# Patient Record
Sex: Female | Born: 1935
Health system: Southern US, Community
[De-identification: ages and names within clinical notes are randomized; demographics above are authoritative.]

## PROBLEM LIST (undated history)

## (undated) DIAGNOSIS — I10 Essential (primary) hypertension: Secondary | ICD-10-CM

## (undated) DIAGNOSIS — Z803 Family history of malignant neoplasm of breast: Secondary | ICD-10-CM

## (undated) DIAGNOSIS — Z853 Personal history of malignant neoplasm of breast: Secondary | ICD-10-CM

## (undated) DIAGNOSIS — D649 Anemia, unspecified: Secondary | ICD-10-CM

## (undated) DIAGNOSIS — E785 Hyperlipidemia, unspecified: Secondary | ICD-10-CM

## (undated) DIAGNOSIS — M1990 Unspecified osteoarthritis, unspecified site: Secondary | ICD-10-CM

## (undated) DIAGNOSIS — Z800 Family history of malignant neoplasm of digestive organs: Secondary | ICD-10-CM

## (undated) DIAGNOSIS — Z9889 Other specified postprocedural states: Secondary | ICD-10-CM

## (undated) DIAGNOSIS — T7840XA Allergy, unspecified, initial encounter: Secondary | ICD-10-CM

## (undated) DIAGNOSIS — C4491 Basal cell carcinoma of skin, unspecified: Secondary | ICD-10-CM

## (undated) DIAGNOSIS — M8580 Other specified disorders of bone density and structure, unspecified site: Secondary | ICD-10-CM

## (undated) HISTORY — DX: Other specified disorders of bone density and structure, unspecified site: M85.80

## (undated) HISTORY — PX: VEIN SURGERY: SHX48

## (undated) HISTORY — PX: COLONOSCOPY: SHX174

## (undated) HISTORY — DX: Personal history of malignant neoplasm of breast: Z85.3

## (undated) HISTORY — DX: Unspecified osteoarthritis, unspecified site: M19.90

## (undated) HISTORY — DX: Basal cell carcinoma of skin, unspecified: C44.91

## (undated) HISTORY — PX: EYE SURGERY: SHX253

## (undated) HISTORY — DX: Allergy, unspecified, initial encounter: T78.40XA

## (undated) HISTORY — DX: Family history of malignant neoplasm of digestive organs: Z80.0

## (undated) HISTORY — DX: Hyperlipidemia, unspecified: E78.5

## (undated) HISTORY — DX: Essential (primary) hypertension: I10

## (undated) HISTORY — DX: Family history of malignant neoplasm of breast: Z80.3

---

## 1978-01-28 HISTORY — PX: BREAST SURGERY: SHX581

## 1981-01-28 HISTORY — PX: ABDOMINAL HYSTERECTOMY: SHX81

## 1991-01-29 HISTORY — PX: CHOLECYSTECTOMY: SHX55

## 1997-07-28 ENCOUNTER — Ambulatory Visit (HOSPITAL_COMMUNITY): Admission: RE | Admit: 1997-07-28 | Discharge: 1997-07-28 | Payer: Self-pay | Admitting: Internal Medicine

## 2001-01-14 ENCOUNTER — Encounter: Payer: Self-pay | Admitting: Internal Medicine

## 2001-01-14 ENCOUNTER — Ambulatory Visit (HOSPITAL_COMMUNITY): Admission: RE | Admit: 2001-01-14 | Discharge: 2001-01-14 | Payer: Self-pay | Admitting: Internal Medicine

## 2001-04-14 ENCOUNTER — Encounter: Payer: Self-pay | Admitting: *Deleted

## 2001-04-14 ENCOUNTER — Ambulatory Visit (HOSPITAL_COMMUNITY): Admission: RE | Admit: 2001-04-14 | Discharge: 2001-04-14 | Payer: Self-pay | Admitting: *Deleted

## 2004-02-09 ENCOUNTER — Ambulatory Visit: Payer: Self-pay | Admitting: Internal Medicine

## 2004-02-15 ENCOUNTER — Ambulatory Visit: Payer: Self-pay | Admitting: Internal Medicine

## 2004-06-12 ENCOUNTER — Ambulatory Visit: Payer: Self-pay | Admitting: Internal Medicine

## 2004-12-03 ENCOUNTER — Ambulatory Visit: Payer: Self-pay | Admitting: Internal Medicine

## 2005-02-11 ENCOUNTER — Ambulatory Visit: Payer: Self-pay | Admitting: Internal Medicine

## 2005-08-05 ENCOUNTER — Ambulatory Visit: Payer: Self-pay | Admitting: Internal Medicine

## 2005-08-12 ENCOUNTER — Ambulatory Visit: Payer: Self-pay | Admitting: Internal Medicine

## 2006-02-05 ENCOUNTER — Ambulatory Visit: Payer: Self-pay | Admitting: Internal Medicine

## 2006-03-19 ENCOUNTER — Ambulatory Visit: Payer: Self-pay | Admitting: Internal Medicine

## 2006-04-30 ENCOUNTER — Ambulatory Visit: Payer: Self-pay | Admitting: Internal Medicine

## 2006-08-18 DIAGNOSIS — J309 Allergic rhinitis, unspecified: Secondary | ICD-10-CM | POA: Insufficient documentation

## 2006-08-18 DIAGNOSIS — Z853 Personal history of malignant neoplasm of breast: Secondary | ICD-10-CM

## 2006-08-18 DIAGNOSIS — M8580 Other specified disorders of bone density and structure, unspecified site: Secondary | ICD-10-CM | POA: Insufficient documentation

## 2006-08-18 DIAGNOSIS — I10 Essential (primary) hypertension: Secondary | ICD-10-CM

## 2006-09-03 ENCOUNTER — Ambulatory Visit: Payer: Self-pay | Admitting: Internal Medicine

## 2006-09-03 LAB — CONVERTED CEMR LAB
ALT: 23 units/L (ref 0–35)
AST: 23 units/L (ref 0–37)
Albumin: 4.1 g/dL (ref 3.5–5.2)
Alkaline Phosphatase: 99 units/L (ref 39–117)
BUN: 9 mg/dL (ref 6–23)
Basophils Absolute: 0 10*3/uL (ref 0.0–0.1)
Basophils Relative: 0 % (ref 0.0–1.0)
Bilirubin Urine: NEGATIVE
Bilirubin, Direct: 0.1 mg/dL (ref 0.0–0.3)
Blood in Urine, dipstick: NEGATIVE
CO2: 27 meq/L (ref 19–32)
Calcium: 9.7 mg/dL (ref 8.4–10.5)
Chloride: 106 meq/L (ref 96–112)
Cholesterol: 132 mg/dL (ref 0–200)
Creatinine, Ser: 0.7 mg/dL (ref 0.4–1.2)
Eosinophils Absolute: 0.1 10*3/uL (ref 0.0–0.6)
Eosinophils Relative: 1.4 % (ref 0.0–5.0)
GFR calc Af Amer: 106 mL/min
GFR calc non Af Amer: 88 mL/min
Glucose, Bld: 95 mg/dL (ref 70–99)
Glucose, Urine, Semiquant: NEGATIVE
HCT: 35.2 % — ABNORMAL LOW (ref 36.0–46.0)
HDL: 50.6 mg/dL (ref >39.0)
Hemoglobin: 12 g/dL (ref 12.0–15.0)
Ketones, urine, test strip: NEGATIVE
LDL Cholesterol: 49 mg/dL (ref 0–99)
Lymphocytes Relative: 33.4 % (ref 12.0–46.0)
MCHC: 34 g/dL (ref 30.0–36.0)
MCV: 88.5 fL (ref 78.0–100.0)
Monocytes Absolute: 0.5 10*3/uL (ref 0.2–0.7)
Monocytes Relative: 8.5 % (ref 3.0–11.0)
Neutro Abs: 3.3 10*3/uL (ref 1.4–7.7)
Neutrophils Relative %: 56.7 % (ref 43.0–77.0)
Nitrite: NEGATIVE
Platelets: 260 10*3/uL (ref 150–400)
Potassium: 3.8 meq/L (ref 3.5–5.1)
Protein, U semiquant: NEGATIVE
RBC: 3.98 M/uL (ref 3.87–5.11)
RDW: 13 % (ref 11.5–14.6)
Sodium: 141 meq/L (ref 135–145)
Specific Gravity, Urine: 1.01
TSH: 1.65 microintl units/mL (ref 0.35–5.50)
Total Bilirubin: 1 mg/dL (ref 0.3–1.2)
Total CHOL/HDL Ratio: 2.6
Total Protein: 7 g/dL (ref 6.0–8.3)
Triglycerides: 160 mg/dL — ABNORMAL HIGH (ref 0–149)
Urobilinogen, UA: 0.2
VLDL: 32 mg/dL (ref 0–40)
WBC Urine, dipstick: NEGATIVE
WBC: 5.9 10*3/uL (ref 4.5–10.5)
pH: 7

## 2006-09-10 ENCOUNTER — Ambulatory Visit: Payer: Self-pay | Admitting: Internal Medicine

## 2006-09-10 DIAGNOSIS — E785 Hyperlipidemia, unspecified: Secondary | ICD-10-CM | POA: Insufficient documentation

## 2006-10-06 ENCOUNTER — Encounter: Payer: Self-pay | Admitting: Internal Medicine

## 2006-12-10 ENCOUNTER — Encounter: Payer: Self-pay | Admitting: Internal Medicine

## 2007-03-09 ENCOUNTER — Ambulatory Visit: Payer: Self-pay | Admitting: Internal Medicine

## 2007-03-10 LAB — CONVERTED CEMR LAB
ALT: 20 units/L (ref 0–35)
AST: 18 units/L (ref 0–37)
Albumin: 4.1 g/dL (ref 3.5–5.2)
Alkaline Phosphatase: 98 units/L (ref 39–117)
BUN: 9 mg/dL (ref 6–23)
Bilirubin, Direct: 0.2 mg/dL (ref 0.0–0.3)
CO2: 30 meq/L (ref 19–32)
Calcium: 9.9 mg/dL (ref 8.4–10.5)
Chloride: 105 meq/L (ref 96–112)
Cholesterol: 150 mg/dL (ref 0–200)
Creatinine, Ser: 0.7 mg/dL (ref 0.4–1.2)
GFR calc Af Amer: 106 mL/min
GFR calc non Af Amer: 88 mL/min
Glucose, Bld: 125 mg/dL — ABNORMAL HIGH (ref 70–99)
HDL: 47.2 mg/dL (ref >39.0)
LDL Cholesterol: 70 mg/dL (ref 0–99)
Potassium: 4.2 meq/L (ref 3.5–5.1)
Sodium: 142 meq/L (ref 135–145)
Total Bilirubin: 0.7 mg/dL (ref 0.3–1.2)
Total CHOL/HDL Ratio: 3.2
Total Protein: 6.7 g/dL (ref 6.0–8.3)
Triglycerides: 162 mg/dL — ABNORMAL HIGH (ref 0–149)
VLDL: 32 mg/dL (ref 0–40)

## 2007-09-22 ENCOUNTER — Ambulatory Visit: Payer: Self-pay | Admitting: Internal Medicine

## 2007-09-28 ENCOUNTER — Telehealth: Payer: Self-pay | Admitting: Internal Medicine

## 2007-10-12 ENCOUNTER — Encounter: Payer: Self-pay | Admitting: Internal Medicine

## 2007-10-15 ENCOUNTER — Encounter: Payer: Self-pay | Admitting: Internal Medicine

## 2008-04-05 ENCOUNTER — Ambulatory Visit: Payer: Self-pay | Admitting: Internal Medicine

## 2008-04-06 LAB — CONVERTED CEMR LAB
ALT: 23 units/L (ref 0–35)
AST: 22 units/L (ref 0–37)
Albumin: 4.1 g/dL (ref 3.5–5.2)
Alkaline Phosphatase: 92 units/L (ref 39–117)
BUN: 10 mg/dL (ref 6–23)
Basophils Absolute: 0.1 10*3/uL (ref 0.0–0.1)
Basophils Relative: 0.9 % (ref 0.0–3.0)
Bilirubin, Direct: 0.1 mg/dL (ref 0.0–0.3)
CO2: 25 meq/L (ref 19–32)
Calcium: 9.7 mg/dL (ref 8.4–10.5)
Chloride: 106 meq/L (ref 96–112)
Cholesterol: 153 mg/dL (ref 0–200)
Creatinine, Ser: 0.6 mg/dL (ref 0.4–1.2)
Eosinophils Absolute: 0.1 10*3/uL (ref 0.0–0.7)
Eosinophils Relative: 1 % (ref 0.0–5.0)
GFR calc Af Amer: 126 mL/min
GFR calc non Af Amer: 104 mL/min
Glucose, Bld: 99 mg/dL (ref 70–99)
HCT: 37.4 % (ref 36.0–46.0)
HDL: 51.7 mg/dL (ref >39.0)
Hemoglobin: 12.8 g/dL (ref 12.0–15.0)
LDL Cholesterol: 74 mg/dL (ref 0–99)
Lymphocytes Relative: 23.7 % (ref 12.0–46.0)
MCHC: 34.1 g/dL (ref 30.0–36.0)
MCV: 88.7 fL (ref 78.0–100.0)
Monocytes Absolute: 0.5 10*3/uL (ref 0.1–1.0)
Monocytes Relative: 6.8 % (ref 3.0–12.0)
Neutro Abs: 5.1 10*3/uL (ref 1.4–7.7)
Neutrophils Relative %: 67.6 % (ref 43.0–77.0)
Platelets: 237 10*3/uL (ref 150–400)
Potassium: 4.1 meq/L (ref 3.5–5.1)
RBC: 4.22 M/uL (ref 3.87–5.11)
RDW: 13 % (ref 11.5–14.6)
Sodium: 140 meq/L (ref 135–145)
TSH: 1.45 microintl units/mL (ref 0.35–5.50)
Total Bilirubin: 1.2 mg/dL (ref 0.3–1.2)
Total CHOL/HDL Ratio: 3
Total Protein: 6.9 g/dL (ref 6.0–8.3)
Triglycerides: 135 mg/dL (ref 0–149)
VLDL: 27 mg/dL (ref 0–40)
WBC: 7.6 10*3/uL (ref 4.5–10.5)

## 2008-09-19 ENCOUNTER — Ambulatory Visit: Payer: Self-pay | Admitting: Internal Medicine

## 2008-10-11 ENCOUNTER — Ambulatory Visit: Payer: Self-pay | Admitting: Internal Medicine

## 2008-10-14 ENCOUNTER — Encounter: Payer: Self-pay | Admitting: Internal Medicine

## 2008-10-18 ENCOUNTER — Encounter: Payer: Self-pay | Admitting: Internal Medicine

## 2008-10-31 ENCOUNTER — Encounter: Payer: Self-pay | Admitting: Internal Medicine

## 2009-04-06 ENCOUNTER — Ambulatory Visit: Payer: Self-pay | Admitting: Internal Medicine

## 2009-04-10 ENCOUNTER — Encounter: Payer: Self-pay | Admitting: Internal Medicine

## 2009-04-10 LAB — CONVERTED CEMR LAB
ALT: 16 units/L (ref 0–35)
AST: 17 units/L (ref 0–37)
Albumin: 4 g/dL (ref 3.5–5.2)
Alkaline Phosphatase: 94 units/L (ref 39–117)
BUN: 10 mg/dL (ref 6–23)
Basophils Absolute: 0 10*3/uL (ref 0.0–0.1)
Basophils Relative: 0.5 % (ref 0.0–3.0)
Bilirubin, Direct: 0.1 mg/dL (ref 0.0–0.3)
CO2: 29 meq/L (ref 19–32)
Calcium: 9.5 mg/dL (ref 8.4–10.5)
Chloride: 109 meq/L (ref 96–112)
Cholesterol: 127 mg/dL (ref 0–200)
Creatinine, Ser: 0.6 mg/dL (ref 0.4–1.2)
Direct LDL: 64 mg/dL
Eosinophils Absolute: 0.1 10*3/uL (ref 0.0–0.7)
Eosinophils Relative: 1.4 % (ref 0.0–5.0)
GFR calc non Af Amer: 103.96 mL/min (ref >60)
Glucose, Bld: 107 mg/dL — ABNORMAL HIGH (ref 70–99)
HCT: 34.6 % — ABNORMAL LOW (ref 36.0–46.0)
HDL: 49.6 mg/dL (ref >39.00)
Hemoglobin: 11.7 g/dL — ABNORMAL LOW (ref 12.0–15.0)
Lymphocytes Relative: 36.1 % (ref 12.0–46.0)
Lymphs Abs: 2.2 10*3/uL (ref 0.7–4.0)
MCHC: 33.8 g/dL (ref 30.0–36.0)
MCV: 89.4 fL (ref 78.0–100.0)
Monocytes Absolute: 0.5 10*3/uL (ref 0.1–1.0)
Monocytes Relative: 7.7 % (ref 3.0–12.0)
Neutro Abs: 3.3 10*3/uL (ref 1.4–7.7)
Neutrophils Relative %: 54.3 % (ref 43.0–77.0)
Platelets: 251 10*3/uL (ref 150.0–400.0)
Potassium: 3.9 meq/L (ref 3.5–5.1)
RBC: 3.87 M/uL (ref 3.87–5.11)
RDW: 13.1 % (ref 11.5–14.6)
Sodium: 141 meq/L (ref 135–145)
TSH: 1.39 microintl units/mL (ref 0.35–5.50)
Total Bilirubin: 0.7 mg/dL (ref 0.3–1.2)
Total CHOL/HDL Ratio: 3
Total Protein: 7 g/dL (ref 6.0–8.3)
Triglycerides: 228 mg/dL — ABNORMAL HIGH (ref 0.0–149.0)
VLDL: 45.6 mg/dL — ABNORMAL HIGH (ref 0.0–40.0)
WBC: 6.1 10*3/uL (ref 4.5–10.5)

## 2009-04-18 ENCOUNTER — Ambulatory Visit: Payer: Self-pay | Admitting: Internal Medicine

## 2009-04-18 LAB — CONVERTED CEMR LAB
Ferritin: 66.6 ng/mL (ref 10.0–291.0)
Iron: 76 ug/dL (ref 42–145)
Saturation Ratios: 21.7 % (ref 20.0–50.0)
Transferrin: 249.9 mg/dL (ref 212.0–360.0)
Vitamin B-12: 281 pg/mL (ref 211–911)

## 2009-04-19 ENCOUNTER — Encounter: Payer: Self-pay | Admitting: Internal Medicine

## 2009-10-23 ENCOUNTER — Ambulatory Visit: Payer: Self-pay | Admitting: Internal Medicine

## 2009-10-23 DIAGNOSIS — R21 Rash and other nonspecific skin eruption: Secondary | ICD-10-CM | POA: Insufficient documentation

## 2009-10-26 LAB — CONVERTED CEMR LAB
ALT: 21 units/L (ref 0–35)
AST: 21 units/L (ref 0–37)
Albumin: 4.3 g/dL (ref 3.5–5.2)
Alkaline Phosphatase: 92 units/L (ref 39–117)
BUN: 12 mg/dL (ref 6–23)
Bilirubin, Direct: 0.1 mg/dL (ref 0.0–0.3)
CO2: 27 meq/L (ref 19–32)
Calcium: 9.8 mg/dL (ref 8.4–10.5)
Chloride: 105 meq/L (ref 96–112)
Cholesterol: 164 mg/dL (ref 0–200)
Creatinine, Ser: 0.5 mg/dL (ref 0.4–1.2)
Direct LDL: 83.4 mg/dL
GFR calc non Af Amer: 122.44 mL/min (ref >60)
Glucose, Bld: 88 mg/dL (ref 70–99)
HDL: 50.3 mg/dL (ref >39.00)
Potassium: 4.5 meq/L (ref 3.5–5.1)
Sodium: 140 meq/L (ref 135–145)
TSH: 1.61 microintl units/mL (ref 0.35–5.50)
Total Bilirubin: 0.8 mg/dL (ref 0.3–1.2)
Total CHOL/HDL Ratio: 3
Total Protein: 6.8 g/dL (ref 6.0–8.3)
Triglycerides: 207 mg/dL — ABNORMAL HIGH (ref 0.0–149.0)
VLDL: 41.4 mg/dL — ABNORMAL HIGH (ref 0.0–40.0)

## 2009-11-01 ENCOUNTER — Encounter: Payer: Self-pay | Admitting: Internal Medicine

## 2010-02-25 LAB — CONVERTED CEMR LAB
Cholesterol, target level: 200 mg/dL
HDL goal, serum: 40 mg/dL
LDL Goal: 130 mg/dL

## 2010-02-27 NOTE — Assessment & Plan Note (Signed)
Summary: 6 month rov/njr/pt rsc/cjr//CCM   Vital Signs:  Patient profile:   77 year old female Weight:      140 pounds Temp:     97.9 degrees F oral Resp:     12 per minute BP sitting:   138 / 52  Vitals Entered By: Lynann Beaver CMA (April 06, 2009 2:07 PM) CC: Routine office visit. Is Patient Diabetic? No Pain Assessment Patient in pain? no        CC:  Routine office visit.Jane Cox  History of Present Illness:  Follow-Up Visit      This is a 77 year old woman who presents for Follow-up visit.  The patient denies chest pain and palpitations.  Since the last visit the patient notes no new problems or concerns.  The patient reports taking meds as prescribed.  When questioned about possible medication side effects, the patient notes none.    All other systems reviewed and were negative   Current Problems (verified): 1)  Physical Examination  (ICD-V70.0) 2)  Hyperlipidemia  (ICD-272.4) 3)  Osteopenia  (ICD-733.90) 4)  Hypertension  (ICD-401.9) 5)  Breast Cancer, Hx of  (ICD-V10.3) 6)  Allergic Rhinitis  (ICD-477.9)  Current Medications (verified): 1)  Lipitor 20 Mg Tabs (Atorvastatin Calcium) .... 1/2 By Mouth Once Daily 2)  Lisinopril 40 Mg Tabs (Lisinopril) .... Take 1 Tablet By Mouth Once A Day 3)  Methyldopa 500 Mg Tabs (Methyldopa) .... Take 1 Tablet By Mouth Twice A Day 4)  Norvasc 5 Mg Tabs (Amlodipine Besylate) .... Take 1/2 Daily 5)  Multivitamins  Tabs (Multiple Vitamin) .... Once Daily 6)  Calcium 500 Mg Tabs (Calcium Carbonate) .... Once Daily 7)  Tylenol 325 Mg Tabs (Acetaminophen) .... As Needed 8)  Advil 200 Mg Tabs (Ibuprofen) .... Once Daily As Needed  Allergies (verified): 1)  ! * Shellfish 2)  Polysporin (Bacitracin-Polymyxin B)  Past History:  Past Medical History: Last updated: 08/18/2006 Allergic rhinitis Breast cancer, hx of Hypertension Basel Cell CA Osteopenia  Past Surgical History: Last updated:  08/18/2006 Cholecystectomy-1993 Hysterectomy-1983 Mastectomy Colonoscopy-05/27/2000  Family History: Last updated: 09/10/2006 Family History Lung cancer-father mother 87yo deceased-  Social History: Last updated: 08/18/2006 Never Smoked Retired Married Alcohol use-yes-rare  Risk Factors: Smoking Status: never (08/18/2006)  Physical Exam  General:  alert and well-developed.   Head:  normocephalic and atraumatic.   Eyes:  pupils equal and pupils round.   Ears:  R ear normal and L ear normal.   Neck:  No deformities, masses, or tenderness noted. Chest Wall:  No deformities, masses, or tenderness noted. Lungs:  Normal respiratory effort, chest expands symmetrically. Lungs are clear to auscultation, no crackles or wheezes. Abdomen:  soft and non-tender.   Msk:  No deformity or scoliosis noted of thoracic or lumbar spine.   Pulses:  R radial normal and L radial normal.   Neurologic:  cranial nerves II-XII intact and gait normal.     Impression & Recommendations:  Problem # 1:  HYPERLIPIDEMIA (ICD-272.4)  controlled continue current medications  Her updated medication list for this problem includes:    Lipitor 20 Mg Tabs (Atorvastatin calcium) .Jane Cox... 1/2 by mouth once daily  Labs Reviewed: SGOT: 22 (04/05/2008)   SGPT: 23 (04/05/2008)  Lipid Goals: Chol Goal: 200 (09/10/2006)   HDL Goal: 40 (09/10/2006)   LDL Goal: 130 (09/10/2006)   TG Goal: 150 (09/10/2006)  Prior 10 Yr Risk Heart Disease: 7 % (09/10/2006)   HDL:51.7 (04/05/2008), 47.2 (03/09/2007)  LDL:74 (04/05/2008), 70 (03/09/2007)  Chol:153 (04/05/2008), 150 (03/09/2007)  Trig:135 (04/05/2008), 162 (03/09/2007)  Orders: Venipuncture (04540) TLB-Lipid Panel (80061-LIPID) TLB-Hepatic/Liver Function Pnl (80076-HEPATIC) TLB-TSH (Thyroid Stimulating Hormone) (84443-TSH)  Problem # 2:  HYPERTENSION (ICD-401.9)  controlled continue current medications  Her updated medication list for this problem includes:     Lisinopril 40 Mg Tabs (Lisinopril) .Jane Cox... Take 1 tablet by mouth once a day    Methyldopa 500 Mg Tabs (Methyldopa) .Jane Cox... Take 1 tablet by mouth twice a day    Amlodipine Besylate 2.5 Mg Tabs (Amlodipine besylate) .Jane Cox... Take 1 tablet by mouth once a day  BP today: 138/52 Prior BP: 140/78 (10/11/2008)  Prior 10 Yr Risk Heart Disease: 7 % (09/10/2006)  Labs Reviewed: K+: 4.1 (04/05/2008) Creat: : 0.6 (04/05/2008)   Chol: 153 (04/05/2008)   HDL: 51.7 (04/05/2008)   LDL: 74 (04/05/2008)   TG: 135 (04/05/2008)  Orders: TLB-BMP (Basic Metabolic Panel-BMET) (80048-METABOL)  Problem # 3:  OSTEOPENIA (ICD-733.90)  calcium and vitamin D  Complete Medication List: 1)  Lipitor 20 Mg Tabs (Atorvastatin calcium) .... 1/2 by mouth once daily 2)  Lisinopril 40 Mg Tabs (Lisinopril) .... Take 1 tablet by mouth once a day 3)  Methyldopa 500 Mg Tabs (Methyldopa) .... Take 1 tablet by mouth twice a day 4)  Amlodipine Besylate 2.5 Mg Tabs (Amlodipine besylate) .... Take 1 tablet by mouth once a day 5)  Multivitamins Tabs (Multiple vitamin) .... Once daily 6)  Calcium 500 Mg Tabs (Calcium carbonate) .... Once daily 7)  Tylenol 325 Mg Tabs (Acetaminophen) .... As needed 8)  Advil 200 Mg Tabs (Ibuprofen) .... Once daily as needed  Other Orders: TLB-CBC Platelet - w/Differential (85025-CBCD)  Patient Instructions: 1)  Please schedule a follow-up appointment in 6 months. wellness exam Prescriptions: AMLODIPINE BESYLATE 2.5 MG TABS (AMLODIPINE BESYLATE) Take 1 tablet by mouth once a day  #90 x 3   Entered and Authorized by:   Birdie Sons MD   Signed by:   Birdie Sons MD on 04/06/2009   Method used:   [facility]        [facility] [phone]* (retail)       [address]       [place], [place]  [address]       Ph: [phone]       Fax: [phone]   RxID:   [id number]

## 2010-02-27 NOTE — Assessment & Plan Note (Signed)
Summary: 6 MTH ROV // RS pt rsc/njr   Vital Signs:  Patient profile:   77 year old female Weight:      139 pounds Temp:     98.7 degrees F oral Pulse rate:   78 / minute Pulse rhythm:   regular Resp:     12 per minute BP sitting:   154 / 72  Vitals Entered By: Lynann Beaver CMA (October 23, 2009 11:31 AM) CC: rov Is Patient Diabetic? No Pain Assessment Patient in pain? no        CC:  rov.  History of Present Illness:  Follow-Up Visit      This is a 77 year old woman who presents for Follow-up visit.  The patient denies chest pain and palpitations.  Since the last visit the patient notes no new problems or concerns except sinus congestion for several weeks with PNDr. no fever, dhills, ST---some relief with OTC antihistamines.  The patient reports taking meds as prescribed.  When questioned about possible medication side effects, the patient notes none.    All other systems reviewed and were negative   Current Problems (verified): 1)  Physical Examination  (ICD-V70.0) 2)  Hyperlipidemia  (ICD-272.4) 3)  Osteopenia  (ICD-733.90) 4)  Hypertension  (ICD-401.9) 5)  Breast Cancer, Hx of  (ICD-V10.3) 6)  Allergic Rhinitis  (ICD-477.9)  Current Medications (verified): 1)  Lipitor 20 Mg Tabs (Atorvastatin Calcium) .... 1/2 By Mouth Once Daily 2)  Lisinopril 40 Mg Tabs (Lisinopril) .... Take 1 Tablet By Mouth Once A Day 3)  Methyldopa 500 Mg Tabs (Methyldopa) .... Take 1 Tablet By Mouth Twice A Day 4)  Amlodipine Besylate 2.5 Mg Tabs (Amlodipine Besylate) .... Take 1 Tablet By Mouth Once A Day 5)  Multivitamins  Tabs (Multiple Vitamin) .... Once Daily 6)  Calcium 500 Mg Tabs (Calcium Carbonate) .... Once Daily 7)  Tylenol 325 Mg Tabs (Acetaminophen) .... As Needed 8)  Advil 200 Mg Tabs (Ibuprofen) .... Once Daily As Needed  Allergies (verified): 1)  ! * Shellfish 2)  Polysporin (Bacitracin-Polymyxin B)  Past History:  Past Medical History: Reviewed history from  08/18/2006 and no changes required. Allergic rhinitis Breast cancer, hx of Hypertension Basel Cell CA Osteopenia  Past Surgical History: Cholecystectomy-1993 Hysterectomy-1983 Mastectomy Colonoscopy-05/27/2000 vein surgery  Family History: Reviewed history from 09/10/2006 and no changes required. Family History Lung cancer-father mother 62yo deceased-  Social History: Reviewed history from 08/18/2006 and no changes required. Never Smoked Retired Married Alcohol use-yes-rare  Physical Exam  General:  alert and well-developed.   Head:  normocephalic and atraumatic.   Eyes:  pupils equal and pupils round.   Neck:  No deformities, masses, or tenderness noted. Chest Wall:  No deformities, masses, or tenderness noted. Lungs:  Normal respiratory effort, chest expands symmetrically. Lungs are clear to auscultation, no crackles or wheezes. Heart:  normal rate and regular rhythm.   Abdomen:  soft and non-tender.   Skin:  turgor normal and color normal.     Impression & Recommendations:  Problem # 1:  ALLERGIC RHINITIS (ICD-477.9)  Her updated medication list for this problem includes:    Fluticasone Propionate 50 Mcg/act Susp (Fluticasone propionate) .Marland Kitchen... 2 sprays each nostril once daily  Problem # 2:  HYPERLIPIDEMIA (ICD-272.4)  controlled continue current medications  Her updated medication list for this problem includes:    Lipitor 20 Mg Tabs (Atorvastatin calcium) .Marland Kitchen... 1/2 by mouth once daily  Labs Reviewed: SGOT: 17 (04/06/2009)   SGPT: 16 (04/06/2009)  Lipid Goals: Chol Goal: 200 (09/10/2006)   HDL Goal: 40 (09/10/2006)   LDL Goal: 130 (09/10/2006)   TG Goal: 150 (09/10/2006)  Prior 10 Yr Risk Heart Disease: 7 % (09/10/2006)   HDL:49.60 (04/06/2009), 51.7 (04/05/2008)  LDL:74 (04/05/2008), 70 (03/09/2007)  Chol:127 (04/06/2009), 153 (04/05/2008)  Trig:228.0 (04/06/2009), 135 (04/05/2008)  Orders: Specimen Handling (16109) TLB-Hepatic/Liver Function Pnl  (80076-HEPATIC) TLB-Lipid Panel (80061-LIPID) TLB-TSH (Thyroid Stimulating Hormone) (84443-TSH)  Problem # 3:  HYPERTENSION (ICD-401.9)  repeat BP ok home BPs 130/70 average Her updated medication list for this problem includes:    Lisinopril 40 Mg Tabs (Lisinopril) .Marland Kitchen... Take 1 tablet by mouth once a day    Methyldopa 500 Mg Tabs (Methyldopa) .Marland Kitchen... Take 1 tablet by mouth twice a day    Amlodipine Besylate 2.5 Mg Tabs (Amlodipine besylate) .Marland Kitchen... Take 1 tablet by mouth once a day  BP today: 154/72 Prior BP: 138/52 (04/06/2009)  Prior 10 Yr Risk Heart Disease: 7 % (09/10/2006)  Labs Reviewed: K+: 3.9 (04/06/2009) Creat: : 0.6 (04/06/2009)   Chol: 127 (04/06/2009)   HDL: 49.60 (04/06/2009)   LDL: 74 (04/05/2008)   TG: 228.0 (04/06/2009)  Orders: Venipuncture (60454) TLB-BMP (Basic Metabolic Panel-BMET) (80048-METABOL)  Complete Medication List: 1)  Lipitor 20 Mg Tabs (Atorvastatin calcium) .... 1/2 by mouth once daily 2)  Lisinopril 40 Mg Tabs (Lisinopril) .... Take 1 tablet by mouth once a day 3)  Methyldopa 500 Mg Tabs (Methyldopa) .... Take 1 tablet by mouth twice a day 4)  Amlodipine Besylate 2.5 Mg Tabs (Amlodipine besylate) .... Take 1 tablet by mouth once a day 5)  Multivitamins Tabs (Multiple vitamin) .... Once daily 6)  Calcium 500 Mg Tabs (Calcium carbonate) .... Once daily 7)  Tylenol 325 Mg Tabs (Acetaminophen) .... As needed 8)  Advil 200 Mg Tabs (Ibuprofen) .... Once daily as needed 9)  Fluticasone Propionate 50 Mcg/act Susp (Fluticasone propionate) .... 2 sprays each nostril once daily  Patient Instructions: 1)  Please schedule a follow-up appointment in 6 months. medicare wellness visit Prescriptions: FLUTICASONE PROPIONATE 50 MCG/ACT  SUSP (FLUTICASONE PROPIONATE) 2 sprays each nostril once daily  #1 vial x 3   Entered and Authorized by:   Birdie Sons MD   Signed by:   Birdie Sons MD on 10/23/2009   Method used:   [facility] to        [facility] [phone]* (retail)       [address]       [place], [place]  [address]       Ph: [phone]       Fax: [phone]   RxID:   [id number]

## 2010-02-27 NOTE — Letter (Signed)
Summary: Health Fair Screening  Health Fair Screening   Imported By: Maryln Gottron 04/10/2009 10:43:05  _____________________________________________________________________  External Attachment:    Type:   Image     Comment:   External Document

## 2010-02-27 NOTE — Letter (Signed)
Summary: [facility]  [facility]   Imported By: Maryln Gottron 05/02/2009 12:26:34  _____________________________________________________________________  External Attachment:    Type:   Image     Comment:   External Document

## 2010-04-13 ENCOUNTER — Other Ambulatory Visit: Payer: Self-pay | Admitting: Internal Medicine

## 2010-04-25 ENCOUNTER — Encounter: Payer: Self-pay | Admitting: Internal Medicine

## 2010-04-26 ENCOUNTER — Encounter: Payer: Self-pay | Admitting: Internal Medicine

## 2010-04-26 ENCOUNTER — Ambulatory Visit (INDEPENDENT_AMBULATORY_CARE_PROVIDER_SITE_OTHER): Payer: Medicare Other | Admitting: Internal Medicine

## 2010-04-26 VITALS — BP 152/78 | HR 88 | Temp 97.7°F | Ht 62.0 in | Wt 138.0 lb

## 2010-04-26 DIAGNOSIS — Z23 Encounter for immunization: Secondary | ICD-10-CM

## 2010-04-26 DIAGNOSIS — Z0000 Encounter for general adult medical examination without abnormal findings: Secondary | ICD-10-CM

## 2010-04-26 DIAGNOSIS — I10 Essential (primary) hypertension: Secondary | ICD-10-CM

## 2010-04-26 DIAGNOSIS — E785 Hyperlipidemia, unspecified: Secondary | ICD-10-CM

## 2010-04-26 LAB — POCT URINALYSIS DIPSTICK
Bilirubin, UA: NEGATIVE
Blood, UA: NEGATIVE
Nitrite, UA: NEGATIVE
Protein, UA: NEGATIVE
pH, UA: 6.5

## 2010-04-26 LAB — CBC WITH DIFFERENTIAL/PLATELET
Basophils Absolute: 0 10*3/uL (ref 0.0–0.1)
Hemoglobin: 12.2 g/dL (ref 12.0–15.0)
Lymphocytes Relative: 21.5 % (ref 12.0–46.0)
Monocytes Relative: 5.1 % (ref 3.0–12.0)
Neutro Abs: 6.5 10*3/uL (ref 1.4–7.7)
Neutrophils Relative %: 72.1 % (ref 43.0–77.0)
Platelets: 240 10*3/uL (ref 150.0–400.0)
RDW: 14 % (ref 11.5–14.6)

## 2010-04-26 LAB — HEPATIC FUNCTION PANEL
ALT: 19 U/L (ref 0–35)
AST: 21 U/L (ref 0–37)
Albumin: 4.2 g/dL (ref 3.5–5.2)
Total Bilirubin: 0.9 mg/dL (ref 0.3–1.2)
Total Protein: 7 g/dL (ref 6.0–8.3)

## 2010-04-26 LAB — LIPID PANEL
Cholesterol: 144 mg/dL (ref 0–200)
HDL: 46.6 mg/dL (ref >39.00)
Triglycerides: 153 mg/dL — ABNORMAL HIGH (ref 0.0–149.0)
VLDL: 30.6 mg/dL (ref 0.0–40.0)

## 2010-04-26 LAB — TSH: TSH: 1.7 u[IU]/mL (ref 0.35–5.50)

## 2010-04-26 LAB — BASIC METABOLIC PANEL
Calcium: 9.6 mg/dL (ref 8.4–10.5)
GFR: 82.66 mL/min (ref >60.00)
Glucose, Bld: 79 mg/dL (ref 70–99)
Sodium: 143 mEq/L (ref 135–145)

## 2010-04-26 NOTE — Assessment & Plan Note (Signed)
Needs f/u Check labs today 

## 2010-04-26 NOTE — Progress Notes (Signed)
  Subjective:    Patient ID: Jane Cox, female    DOB: 1934/01/20, 77 y.o.   MRN: [id number]  HPI  CPX  URI sxs for 10-12 days. Started after travelling. Sxs are resolving Past Medical History  Diagnosis Date  . Allergy   . HX: breast cancer   . Hypertension   . Basal cell carcinoma   . Osteopenia    Past Surgical History  Procedure Date  . Cholecystectomy 1993  . Abdominal hysterectomy 1983  . Breast surgery     mastectomy  . Vein surgery     reports that she has never smoked. She does not have any smokeless tobacco history on file. She reports that she drinks alcohol. Her drug history not on file. family history includes Lung cancer in her father. Allergies  Allergen Reactions  . Bacitracin-Polymyxin B     REACTION: rash     Review of Systems  patient denies chest pain, shortness of breath, orthopnea. Denies lower extremity edema, abdominal pain, change in appetite, change in bowel movements. Patient denies rashes, musculoskeletal complaints. No other specific complaints in a complete review of systems.      Objective:   Physical Exam  Well-developed well-nourished female in no acute distress. HEENT exam atraumatic, normocephalic, extraocular muscles are intact. Neck is supple. No jugular venous distention no thyromegaly. Chest clear to auscultation without increased work of breathing. Cardiac exam S1 and S2 are regular. Abdominal exam active bowel sounds, soft, nontender. Extremities no edema. Neurologic exam she is alert without any motor sensory deficits. Gait is normal.     Assessment & Plan:  Well visit - health maint UTD

## 2010-04-26 NOTE — Assessment & Plan Note (Signed)
She has not checked bps at home for one month Prior to that bps in the 120/70 range.

## 2010-06-08 ENCOUNTER — Ambulatory Visit (AMBULATORY_SURGERY_CENTER): Payer: Medicare Other | Admitting: *Deleted

## 2010-06-08 VITALS — Ht 62.0 in | Wt 141.0 lb

## 2010-06-08 DIAGNOSIS — Z1211 Encounter for screening for malignant neoplasm of colon: Secondary | ICD-10-CM

## 2010-06-08 MED ORDER — PEG-KCL-NACL-NASULF-NA ASC-C 100 G PO SOLR: 1.0000 | Freq: Once | ORAL | Status: AC

## 2010-06-15 NOTE — Procedures (Signed)
[place]  Patient:    Jane Cox, Jane Cox Visit Number: [id number] MRN: [id number]          Service Type: DSU Location: [facility] 2861 01 Attending Physician:  Jane Cox Dictated by:   Jane Cox, M.D. Proc. Date: 04/14/01 Admit Date:  04/14/2001 Discharge Date: 04/14/2001   CC:         Jane Cox, M.D. [facility]   Procedure Report  PROCEDURES PERFORMED: 1. Abdominal aortogram. 2. Bilateral selective renal angiogram.  DIAGNOSES: 1. Renal artery stenosis. 2. Hypertension.  HISTORY:  Jane Cox is a 77 year old white female with a known history of right renal artery stenosis, who has previously undergone percutaneous angioplasty in 1996 at [facility], [place].  The patient did well for a number of years, however, recently has had labile hypertension.  MRA was performed on January 14, 2001 suggesting a shelf-like lesion in the mid section of the right renal artery suggestive of restenosis.  She presented for further assessment.  TECHNIQUE:  Informed consent was obtained.  The patient was brought to the peripheral vascular laboratory.  A 5-French sheath was placed in the right femoral artery using modified Seldinger technique.  A 5-French pigtail catheter was advanced into the abdominal aorta and abdominal aortogram was performed using power injections of contrast.  A 5-French JR-4 diagnostic catheter was introduced and selective left and right renal angiograms were performed.  A 5-French renal double curve was then introduced and used to engage the right renal artery.  A 0.018 inch SV-5 wire was advanced to the distal section of the right renal artery and the renal double catheter exchanged for a short end-hole catheter.  Transstenotic gradient was performed across the proximal right renal artery.  At the termination of the case, the catheters and sheath were removed.  Manual pressure was applied until adequate hemostasis was achieved.  The  patient tolerated the procedure well and was transferred to the floor in stable condition.  FINDINGS: 1. Abdominal aorta was of normal caliber.  There was only mild atheromatous    build-up without critical stenosis or dilatation. 2. Left renal artery was single, widely patent with only mild irregularities. 3. Right renal artery is single, widely patent.  There is a moderate stenosis    of 50% in the proximal segment.  This appeared to be a concentric    narrowing.  Transstenotic gradient was less than 10 mmHg.  ASSESSMENT/PLAN:  Jane Cox is a 77 year old female with hypertension and history of renal artery stenosis, status post remote intervention, who has noncritical restenosis in the proximal right renal artery.  Transstenotic gradient is not critical and she appeared to have good flow to the kidney. Continued medical therapy will be pursued. Dictated by:   Jane Cox, M.D. Attending Physician:  Jane Cox DD:  04/14/01 TD:  04/15/01 Job: [id number]

## 2010-06-22 ENCOUNTER — Ambulatory Visit (AMBULATORY_SURGERY_CENTER): Payer: Medicare Other | Admitting: Gastroenterology

## 2010-06-22 ENCOUNTER — Encounter: Payer: Self-pay | Admitting: Gastroenterology

## 2010-06-22 DIAGNOSIS — K5730 Diverticulosis of large intestine without perforation or abscess without bleeding: Secondary | ICD-10-CM

## 2010-06-22 DIAGNOSIS — Z1211 Encounter for screening for malignant neoplasm of colon: Secondary | ICD-10-CM

## 2010-06-22 MED ORDER — SODIUM CHLORIDE 0.9 % IV SOLN: 500.0000 mL | INTRAVENOUS | Status: DC

## 2010-06-22 NOTE — Patient Instructions (Signed)
Please review discharge instructions Please read information about diverticulosis

## 2010-06-26 ENCOUNTER — Telehealth: Payer: Self-pay | Admitting: *Deleted

## 2010-06-26 NOTE — Telephone Encounter (Signed)
Ans . Machine had i.d. Therefore; left message that we called.

## 2010-07-30 ENCOUNTER — Ambulatory Visit: Payer: Medicare Other | Admitting: Internal Medicine

## 2010-07-30 ENCOUNTER — Encounter: Payer: Self-pay | Admitting: Internal Medicine

## 2010-07-30 ENCOUNTER — Ambulatory Visit (INDEPENDENT_AMBULATORY_CARE_PROVIDER_SITE_OTHER): Payer: Medicare Other | Admitting: Internal Medicine

## 2010-07-30 VITALS — BP 144/76 | HR 92 | Temp 98.6°F | Wt 142.0 lb

## 2010-07-30 DIAGNOSIS — I10 Essential (primary) hypertension: Secondary | ICD-10-CM

## 2010-07-30 NOTE — Progress Notes (Signed)
  Subjective:    Patient ID: Jane Cox, female    DOB: 1933-03-05, 77 y.o.   MRN: [id number]  HPI  Htn_ tolerating m eds without difficulty. Home bps 130s/60s.  Lipids---have been well controlled on statin  Past Medical History  Diagnosis Date  . Allergy   . HX: breast cancer   . Hypertension   . Basal cell carcinoma   . Osteopenia   . Hyperlipidemia    Past Surgical History  Procedure Date  . Cholecystectomy 1993  . Abdominal hysterectomy 1983  . Breast surgery     mastectomy  . Vein surgery   . Colonoscopy     reports that she has never smoked. She has never used smokeless tobacco. She reports that she drinks about .5 ounces of alcohol per week. She reports that she does not use illicit drugs. family history includes Lung cancer in her father. Allergies  Allergen Reactions  . Bacitracin-Polymyxin B     REACTION: rash  . Neosporin (Neomycin-Polymyx-Gramicid)      Review of Systems  patient denies chest pain, shortness of breath, orthopnea. Denies lower extremity edema, abdominal pain, change in appetite, change in bowel movements. Patient denies rashes, musculoskeletal complaints. No other specific complaints in a complete review of systems.      Objective:   Physical Exam  Well-developed well-nourished female in no acute distress. HEENT exam atraumatic, normocephalic, extraocular muscles are intact. Neck is supple. No jugular venous distention no thyromegaly. Chest clear to auscultation without increased work of breathing. Cardiac exam S1 and S2 are regular. Abdominal exam active bowel sounds, soft, nontender. Extremities no edema. Neurologic exam she is alert without any motor sensory deficits. Gait is normal.        Assessment & Plan:

## 2010-07-30 NOTE — Assessment & Plan Note (Signed)
Home  Bps are ok She will bring her BP cuff with her next time.

## 2010-11-23 ENCOUNTER — Encounter: Payer: Self-pay | Admitting: Internal Medicine

## 2010-11-26 ENCOUNTER — Encounter: Payer: Self-pay | Admitting: Internal Medicine

## 2010-11-26 ENCOUNTER — Ambulatory Visit (INDEPENDENT_AMBULATORY_CARE_PROVIDER_SITE_OTHER): Payer: Medicare Other | Admitting: Internal Medicine

## 2010-11-26 DIAGNOSIS — E785 Hyperlipidemia, unspecified: Secondary | ICD-10-CM

## 2010-11-26 DIAGNOSIS — I10 Essential (primary) hypertension: Secondary | ICD-10-CM

## 2010-11-26 LAB — HEPATIC FUNCTION PANEL
Albumin: 4.4 g/dL (ref 3.5–5.2)
Alkaline Phosphatase: 88 U/L (ref 39–117)
Bilirubin, Direct: 0.1 mg/dL (ref 0.0–0.3)

## 2010-11-26 LAB — LDL CHOLESTEROL, DIRECT: Direct LDL: 72 mg/dL

## 2010-11-26 LAB — LIPID PANEL
Total CHOL/HDL Ratio: 3
VLDL: 41.6 mg/dL — ABNORMAL HIGH (ref 0.0–40.0)

## 2010-11-26 NOTE — Assessment & Plan Note (Signed)
Well controlled. Continue current meds

## 2010-11-26 NOTE — Progress Notes (Signed)
  Subjective:    Patient ID: Jane Cox, female    DOB: 07-18-1933, 77 y.o.   MRN: [id number]  HPI  HTN---home BPs range 12-154/60s  Lipids- tolerating meds  Past Medical History  Diagnosis Date  . Allergy   . HX: breast cancer   . Hypertension   . Basal cell carcinoma   . Osteopenia   . Hyperlipidemia    Past Surgical History  Procedure Date  . Cholecystectomy 1993  . Abdominal hysterectomy 1983  . Breast surgery     mastectomy  . Vein surgery   . Colonoscopy     reports that she has never smoked. She has never used smokeless tobacco. She reports that she drinks about .5 ounces of alcohol per week. She reports that she does not use illicit drugs. family history includes Lung cancer in her father. Allergies  Allergen Reactions  . Bacitracin-Polymyxin B     REACTION: rash  . Neosporin (Neomycin-Polymyx-Gramicid)      Review of Systems  patient denies chest pain, shortness of breath, orthopnea. Denies lower extremity edema, abdominal pain, change in appetite, change in bowel movements. Patient denies rashes, musculoskeletal complaints. No other specific complaints in a complete review of systems.      Objective:   Physical Exam  Well-developed well-nourished female in no acute distress. HEENT exam atraumatic, normocephalic, extraocular muscles are intact. Neck is supple. No jugular venous distention no thyromegaly. Chest clear to auscultation without increased work of breathing. Cardiac exam S1 and S2 are regular. Abdominal exam active bowel sounds, soft, nontender. Extremities no edema.      Assessment & Plan:

## 2010-11-26 NOTE — Assessment & Plan Note (Signed)
Home bps and our BP are essentially the same.  Continue same meds

## 2011-01-01 ENCOUNTER — Other Ambulatory Visit: Payer: Self-pay | Admitting: Internal Medicine

## 2011-01-02 ENCOUNTER — Other Ambulatory Visit: Payer: Self-pay | Admitting: *Deleted

## 2011-01-02 MED ORDER — FLUTICASONE PROPIONATE 50 MCG/ACT NA SUSP: 1.0000 | Freq: Every day | NASAL | Status: DC

## 2011-02-13 DIAGNOSIS — Z821 Family history of blindness and visual loss: Secondary | ICD-10-CM | POA: Diagnosis not present

## 2011-02-13 DIAGNOSIS — H2510 Age-related nuclear cataract, unspecified eye: Secondary | ICD-10-CM | POA: Diagnosis not present

## 2011-03-27 ENCOUNTER — Other Ambulatory Visit: Payer: Self-pay | Admitting: Internal Medicine

## 2011-03-27 MED ORDER — ATORVASTATIN CALCIUM 20 MG PO TABS: 20.0000 mg | ORAL_TABLET | Freq: Every day | ORAL | Status: DC

## 2011-03-27 NOTE — Telephone Encounter (Signed)
Pt called req refills for the following med: lisinopril (PRINIVIL,ZESTRIL) 40 MG tablet,atorvastatin (LIPITOR) 20 MG tablet, amLODipine (NORVASC) 2.5 MG tablet [facility] [phone]

## 2011-03-27 NOTE — Telephone Encounter (Signed)
rx sent in electronically 

## 2011-05-14 DIAGNOSIS — H2510 Age-related nuclear cataract, unspecified eye: Secondary | ICD-10-CM | POA: Diagnosis not present

## 2011-05-27 ENCOUNTER — Ambulatory Visit: Payer: Medicare Other | Admitting: Internal Medicine

## 2011-05-30 ENCOUNTER — Encounter: Payer: Self-pay | Admitting: Internal Medicine

## 2011-05-30 ENCOUNTER — Ambulatory Visit (INDEPENDENT_AMBULATORY_CARE_PROVIDER_SITE_OTHER): Payer: Medicare Other | Admitting: Internal Medicine

## 2011-05-30 VITALS — BP 144/66 | HR 80 | Temp 98.2°F | Wt 144.0 lb

## 2011-05-30 DIAGNOSIS — I10 Essential (primary) hypertension: Secondary | ICD-10-CM | POA: Diagnosis not present

## 2011-05-30 DIAGNOSIS — E785 Hyperlipidemia, unspecified: Secondary | ICD-10-CM

## 2011-05-30 LAB — BASIC METABOLIC PANEL
Chloride: 103 mEq/L (ref 96–112)
GFR: 97.7 mL/min (ref >60.00)
Glucose, Bld: 96 mg/dL (ref 70–99)
Potassium: 4 mEq/L (ref 3.5–5.1)
Sodium: 140 mEq/L (ref 135–145)

## 2011-05-30 LAB — HEPATIC FUNCTION PANEL
Albumin: 4.3 g/dL (ref 3.5–5.2)
Total Protein: 7.1 g/dL (ref 6.0–8.3)

## 2011-05-30 LAB — LIPID PANEL
Cholesterol: 153 mg/dL (ref 0–200)
HDL: 55.4 mg/dL (ref >39.00)
Total CHOL/HDL Ratio: 3
VLDL: 49.8 mg/dL — ABNORMAL HIGH (ref 0.0–40.0)

## 2011-05-30 NOTE — Assessment & Plan Note (Signed)
Home BPs are ok--- check labs

## 2011-05-30 NOTE — Assessment & Plan Note (Signed)
Check lipid today 

## 2011-05-30 NOTE — Progress Notes (Signed)
Patient ID: Jane Cox, female   DOB: Jan 29, 1934, 78 y.o.   MRN: [id number] htn-- home bps 130s/60s  Lipids- tolerating meds  Past Medical History  Diagnosis Date  . Allergy   . HX: breast cancer   . Hypertension   . Basal cell carcinoma   . Osteopenia   . Hyperlipidemia     History   Social History  . Marital Status: Married    Spouse Name: N/A    Number of Children: N/A  . Years of Education: N/A   Occupational History  . Not on file.   Social History Main Topics  . Smoking status: Never Smoker   . Smokeless tobacco: Never Used  . Alcohol Use: 0.5 oz/week    1 drink(s) per week  . Drug Use: No  . Sexually Active: Not on file   Other Topics Concern  . Not on file   Social History Narrative  . No narrative on file    Past Surgical History  Procedure Date  . Cholecystectomy 1993  . Abdominal hysterectomy 1983  . Breast surgery     mastectomy  . Vein surgery   . Colonoscopy     Family History  Problem Relation Age of Onset  . Lung cancer Father     Allergies  Allergen Reactions  . Bacitracin-Polymyxin B     REACTION: rash  . Neosporin (Neomycin-Polymyxin-Gramicidin)     Current Outpatient Prescriptions on File Prior to Visit  Medication Sig Dispense Refill  . amLODipine (NORVASC) 2.5 MG tablet TAKE 1 TABLET BY MOUTH EVERY DAY  90 tablet  3  . atorvastatin (LIPITOR) 20 MG tablet Take 1 tablet (20 mg total) by mouth daily.  90 tablet  3  . calcium gluconate 500 MG tablet Take 500 mg by mouth daily.        . fluticasone (FLONASE) 50 MCG/ACT nasal spray Place 1 spray into the nose daily.  16 g  2  . lisinopril (PRINIVIL,ZESTRIL) 40 MG tablet TAKE 1 TABLET BY MOUTH EVERY DAY  90 tablet  3  . methyldopa (ALDOMET) 500 MG tablet TAKE 1 TABLET BY MOUTH TWICE A DAY  180 tablet  3  . Multiple Vitamin (MULTIVITAMIN) tablet Take 1 tablet by mouth daily.           patient denies chest pain, shortness of breath, orthopnea. Denies lower extremity edema,  abdominal pain, change in appetite, change in bowel movements. Patient denies rashes, musculoskeletal complaints. No other specific complaints in a complete review of systems.   BP 144/66  Pulse 80  Temp(Src) 98.2 F (36.8 C) (Oral)  Wt 144 lb (65.318 kg)  Well-developed well-nourished female in no acute distress. HEENT exam atraumatic, normocephalic, extraocular muscles are intact. Neck is supple. No jugular venous distention no thyromegaly. Chest clear to auscultation without increased work of breathing. Cardiac exam S1 and S2 are regular. Abdominal exam active bowel sounds, soft, nontender.

## 2011-06-25 DIAGNOSIS — H2510 Age-related nuclear cataract, unspecified eye: Secondary | ICD-10-CM | POA: Diagnosis not present

## 2011-06-25 DIAGNOSIS — H18419 Arcus senilis, unspecified eye: Secondary | ICD-10-CM | POA: Diagnosis not present

## 2011-07-08 DIAGNOSIS — L570 Actinic keratosis: Secondary | ICD-10-CM | POA: Diagnosis not present

## 2011-07-08 DIAGNOSIS — L821 Other seborrheic keratosis: Secondary | ICD-10-CM | POA: Diagnosis not present

## 2011-07-08 DIAGNOSIS — D1801 Hemangioma of skin and subcutaneous tissue: Secondary | ICD-10-CM | POA: Diagnosis not present

## 2011-10-11 ENCOUNTER — Telehealth: Payer: Self-pay | Admitting: Internal Medicine

## 2011-10-11 DIAGNOSIS — M8580 Other specified disorders of bone density and structure, unspecified site: Secondary | ICD-10-CM

## 2011-10-11 NOTE — Telephone Encounter (Signed)
Pt stated her last bmd was in 2008 at [facility] office. Pt is requesting another bmd. Can I sch?

## 2011-10-14 NOTE — Telephone Encounter (Signed)
Do you mean BMP?

## 2011-10-14 NOTE — Telephone Encounter (Signed)
Pt is sch for 10-22-2011

## 2011-10-14 NOTE — Telephone Encounter (Signed)
No bone density test

## 2011-10-14 NOTE — Telephone Encounter (Signed)
Yes order is in the computer

## 2011-10-22 ENCOUNTER — Other Ambulatory Visit: Payer: Medicare Other

## 2011-10-25 DIAGNOSIS — Z23 Encounter for immunization: Secondary | ICD-10-CM | POA: Diagnosis not present

## 2011-11-19 ENCOUNTER — Ambulatory Visit (INDEPENDENT_AMBULATORY_CARE_PROVIDER_SITE_OTHER)
Admission: RE | Admit: 2011-11-19 | Discharge: 2011-11-19 | Disposition: A | Discharge status: Home / Self Care | Payer: Medicare Other | Source: Ambulatory Visit | Attending: Internal Medicine | Admitting: Internal Medicine

## 2011-11-19 DIAGNOSIS — M899 Disorder of bone, unspecified: Secondary | ICD-10-CM | POA: Diagnosis not present

## 2011-11-19 DIAGNOSIS — M949 Disorder of cartilage, unspecified: Secondary | ICD-10-CM | POA: Diagnosis not present

## 2011-11-19 DIAGNOSIS — M8580 Other specified disorders of bone density and structure, unspecified site: Secondary | ICD-10-CM

## 2011-12-02 ENCOUNTER — Ambulatory Visit (INDEPENDENT_AMBULATORY_CARE_PROVIDER_SITE_OTHER): Payer: Medicare Other | Admitting: Internal Medicine

## 2011-12-02 ENCOUNTER — Encounter: Payer: Self-pay | Admitting: Internal Medicine

## 2011-12-02 VITALS — BP 150/74 | HR 90 | Temp 98.2°F | Wt 139.0 lb

## 2011-12-02 DIAGNOSIS — M899 Disorder of bone, unspecified: Secondary | ICD-10-CM | POA: Diagnosis not present

## 2011-12-02 DIAGNOSIS — E785 Hyperlipidemia, unspecified: Secondary | ICD-10-CM

## 2011-12-02 DIAGNOSIS — I10 Essential (primary) hypertension: Secondary | ICD-10-CM | POA: Diagnosis not present

## 2011-12-02 DIAGNOSIS — M949 Disorder of cartilage, unspecified: Secondary | ICD-10-CM

## 2011-12-02 NOTE — Assessment & Plan Note (Signed)
Reviewed dexa scan-- No need for treatment

## 2011-12-02 NOTE — Assessment & Plan Note (Signed)
Controlled; continue same meds 

## 2011-12-02 NOTE — Progress Notes (Signed)
Patient ID: Jane Cox, female   DOB: 1933-05-11, 78 y.o.   MRN: [id number] htn-- home bps 120-135/60s She is interested in DEXA report Lipids- tolerating meds  Reviewed PMH, PSH, Soc hx

## 2011-12-02 NOTE — Assessment & Plan Note (Signed)
Home bps are good Continue same meds 

## 2011-12-23 ENCOUNTER — Other Ambulatory Visit: Payer: Self-pay | Admitting: Internal Medicine

## 2012-01-13 DIAGNOSIS — Z1231 Encounter for screening mammogram for malignant neoplasm of breast: Secondary | ICD-10-CM | POA: Diagnosis not present

## 2012-01-20 DIAGNOSIS — R928 Other abnormal and inconclusive findings on diagnostic imaging of breast: Secondary | ICD-10-CM | POA: Diagnosis not present

## 2012-02-13 ENCOUNTER — Encounter: Payer: Self-pay | Admitting: Internal Medicine

## 2012-03-02 ENCOUNTER — Other Ambulatory Visit: Payer: Self-pay | Admitting: *Deleted

## 2012-03-02 MED ORDER — LISINOPRIL 40 MG PO TABS: 40.0000 mg | ORAL_TABLET | Freq: Every day | ORAL | Status: DC

## 2012-03-04 ENCOUNTER — Other Ambulatory Visit: Payer: Self-pay | Admitting: *Deleted

## 2012-03-04 MED ORDER — AMLODIPINE BESYLATE 2.5 MG PO TABS: 2.5000 mg | ORAL_TABLET | Freq: Every day | ORAL | Status: DC

## 2012-05-12 DIAGNOSIS — H18419 Arcus senilis, unspecified eye: Secondary | ICD-10-CM | POA: Diagnosis not present

## 2012-05-12 DIAGNOSIS — H2510 Age-related nuclear cataract, unspecified eye: Secondary | ICD-10-CM | POA: Diagnosis not present

## 2012-05-12 DIAGNOSIS — H25019 Cortical age-related cataract, unspecified eye: Secondary | ICD-10-CM | POA: Diagnosis not present

## 2012-05-12 DIAGNOSIS — H25049 Posterior subcapsular polar age-related cataract, unspecified eye: Secondary | ICD-10-CM | POA: Diagnosis not present

## 2012-06-03 ENCOUNTER — Ambulatory Visit (INDEPENDENT_AMBULATORY_CARE_PROVIDER_SITE_OTHER): Payer: Medicare Other | Admitting: Internal Medicine

## 2012-06-03 ENCOUNTER — Encounter: Payer: Self-pay | Admitting: Internal Medicine

## 2012-06-03 VITALS — BP 126/61 | HR 88 | Temp 98.0°F | Wt 138.0 lb

## 2012-06-03 DIAGNOSIS — I10 Essential (primary) hypertension: Secondary | ICD-10-CM | POA: Diagnosis not present

## 2012-06-03 DIAGNOSIS — E785 Hyperlipidemia, unspecified: Secondary | ICD-10-CM

## 2012-06-03 LAB — BASIC METABOLIC PANEL
CO2: 26 mEq/L (ref 19–32)
Chloride: 106 mEq/L (ref 96–112)
Creatinine, Ser: 0.6 mg/dL (ref 0.4–1.2)
Potassium: 4 mEq/L (ref 3.5–5.1)

## 2012-06-03 LAB — LDL CHOLESTEROL, DIRECT: Direct LDL: 73.5 mg/dL

## 2012-06-03 LAB — HEPATIC FUNCTION PANEL
AST: 20 U/L (ref 0–37)
Albumin: 4.1 g/dL (ref 3.5–5.2)
Total Bilirubin: 1.1 mg/dL (ref 0.3–1.2)

## 2012-06-03 LAB — LIPID PANEL
Total CHOL/HDL Ratio: 3
Triglycerides: 222 mg/dL — ABNORMAL HIGH (ref 0.0–149.0)
VLDL: 44.4 mg/dL — ABNORMAL HIGH (ref 0.0–40.0)

## 2012-06-03 NOTE — Assessment & Plan Note (Signed)
Home bps are well controlled Continue meds

## 2012-06-03 NOTE — Assessment & Plan Note (Signed)
Check labs today Continue meds 

## 2012-06-03 NOTE — Progress Notes (Signed)
Patient ID: Jane Cox, female   DOB: May 16, 1933, 79 y.o.   MRN: [id number]  htn- tolerating meds BP Readings from Last 3 Encounters:  06/03/12 142/82  12/02/11 150/74  05/30/11 144/66   She is feeling well. Home BPs 120s/60s  Lipids- tolerating meds  Reviewed pmh, psh, sochx  meds- reviewed  Ros- no sxs  Exam:  Well-developed well-nourished female in no acute distress. HEENT exam atraumatic, normocephalic, extraocular muscles are intact. Neck is supple. No jugular venous distention no thyromegaly. Chest clear to auscultation without increased work of breathing. Cardiac exam S1 and S2 are regular. Abdominal exam active bowel sounds, soft, nontender. Extremities no edema. Neurologic exam she is alert without any motor sensory deficits. Gait is normal.

## 2012-06-07 ENCOUNTER — Encounter: Payer: Self-pay | Admitting: Internal Medicine

## 2012-07-07 DIAGNOSIS — L821 Other seborrheic keratosis: Secondary | ICD-10-CM | POA: Diagnosis not present

## 2012-07-07 DIAGNOSIS — Z85828 Personal history of other malignant neoplasm of skin: Secondary | ICD-10-CM | POA: Diagnosis not present

## 2012-07-07 DIAGNOSIS — L820 Inflamed seborrheic keratosis: Secondary | ICD-10-CM | POA: Diagnosis not present

## 2012-07-07 DIAGNOSIS — L819 Disorder of pigmentation, unspecified: Secondary | ICD-10-CM | POA: Diagnosis not present

## 2012-07-07 DIAGNOSIS — L578 Other skin changes due to chronic exposure to nonionizing radiation: Secondary | ICD-10-CM | POA: Diagnosis not present

## 2012-07-07 DIAGNOSIS — L738 Other specified follicular disorders: Secondary | ICD-10-CM | POA: Diagnosis not present

## 2012-07-07 DIAGNOSIS — D1801 Hemangioma of skin and subcutaneous tissue: Secondary | ICD-10-CM | POA: Diagnosis not present

## 2012-09-01 ENCOUNTER — Other Ambulatory Visit: Payer: Self-pay | Admitting: Internal Medicine

## 2012-09-03 ENCOUNTER — Other Ambulatory Visit: Payer: Self-pay | Admitting: Internal Medicine

## 2012-10-14 DIAGNOSIS — Z23 Encounter for immunization: Secondary | ICD-10-CM | POA: Diagnosis not present

## 2012-12-03 ENCOUNTER — Other Ambulatory Visit: Payer: Self-pay

## 2012-12-26 ENCOUNTER — Other Ambulatory Visit: Payer: Self-pay | Admitting: Internal Medicine

## 2013-01-15 DIAGNOSIS — Z1231 Encounter for screening mammogram for malignant neoplasm of breast: Secondary | ICD-10-CM | POA: Diagnosis not present

## 2013-02-09 DIAGNOSIS — N6459 Other signs and symptoms in breast: Secondary | ICD-10-CM | POA: Diagnosis not present

## 2013-02-22 ENCOUNTER — Other Ambulatory Visit: Payer: Self-pay | Admitting: Internal Medicine

## 2013-03-05 ENCOUNTER — Encounter: Payer: Self-pay | Admitting: Internal Medicine

## 2013-03-15 ENCOUNTER — Encounter: Payer: Self-pay | Admitting: Internal Medicine

## 2013-03-17 ENCOUNTER — Ambulatory Visit (INDEPENDENT_AMBULATORY_CARE_PROVIDER_SITE_OTHER): Payer: Medicare Other | Admitting: Family Medicine

## 2013-03-17 ENCOUNTER — Telehealth: Payer: Self-pay | Admitting: Internal Medicine

## 2013-03-17 ENCOUNTER — Encounter: Payer: Self-pay | Admitting: Family Medicine

## 2013-03-17 VITALS — BP 160/72 | HR 56 | Temp 98.6°F | Wt 142.2 lb

## 2013-03-17 DIAGNOSIS — I10 Essential (primary) hypertension: Secondary | ICD-10-CM | POA: Diagnosis not present

## 2013-03-17 DIAGNOSIS — L509 Urticaria, unspecified: Secondary | ICD-10-CM | POA: Diagnosis not present

## 2013-03-17 MED ORDER — AMLODIPINE BESYLATE 2.5 MG PO TABS: 2.5000 mg | ORAL_TABLET | Freq: Every day | ORAL | Status: DC

## 2013-03-17 MED ORDER — METHYLPREDNISOLONE ACETATE 80 MG/ML IJ SUSP
80.0000 mg | Freq: Once | INTRAMUSCULAR | Status: AC
  Administered 2013-03-17: 80 mg via INTRAMUSCULAR

## 2013-03-17 MED ORDER — PREDNISONE 10 MG PO TABS: ORAL_TABLET | ORAL | Status: DC

## 2013-03-17 NOTE — Telephone Encounter (Signed)
I don't see where it was refused, rx sent in electronically

## 2013-03-17 NOTE — Patient Instructions (Signed)

## 2013-03-17 NOTE — Telephone Encounter (Signed)
Pt request refill of amLODipine (NORVASC) 2.5 MG tablet  1/day Pt states it was refused, but pt has cpe on 06/04/13 [facility]: [facility] [address]

## 2013-03-17 NOTE — Assessment & Plan Note (Signed)
Pt under a lot of stress at this time rto about 2 weeks to reheck bp

## 2013-03-17 NOTE — Progress Notes (Signed)
  Subjective:     Jane Cox is a 80 y.o. female who presents for evaluation of a rash involving the head, lower extremity, trunk and upper body. Rash started 3 days ago. Lesions are pink, and flat in texture. Rash has changed over time. Rash is pruritic. Associated symptoms: none. Patient denies: abdominal pain, arthralgia, congestion, cough, crankiness, decrease in appetite, decrease in energy level, fever, headache, irritability, myalgia, nausea, sore throat and vomiting. Patient has not had contacts with similar rash. Patient has not had new exposures (soaps, lotions, laundry detergents, foods, medications, plants, insects or animals).She has been under a lot of stress secondary to her husband being in the hospital.  Past Medical History  Diagnosis Date  . Allergy   . HX: breast cancer   . Hypertension   . Basal cell carcinoma   . Osteopenia   . Hyperlipidemia    History   Social History  . Marital Status: Married    Spouse Name: N/A    Number of Children: N/A  . Years of Education: N/A   Occupational History  . Not on file.   Social History Main Topics  . Smoking status: Never Smoker   . Smokeless tobacco: Never Used  . Alcohol Use: 0.5 oz/week    1 drink(s) per week  . Drug Use: No  . Sexual Activity: Not on file   Other Topics Concern  . Not on file   Social History Narrative  . No narrative on file   Current Outpatient Prescriptions on File Prior to Visit  Medication Sig Dispense Refill  . amLODipine (NORVASC) 2.5 MG tablet Take 1 tablet (2.5 mg total) by mouth daily.  90 tablet  3  . atorvastatin (LIPITOR) 20 MG tablet TAKE 1 TABLET (20 MG TOTAL) BY MOUTH DAILY.  90 tablet  2  . lisinopril (PRINIVIL,ZESTRIL) 40 MG tablet TAKE 1 TABLET (40 MG TOTAL) BY MOUTH DAILY.  90 tablet  0  . methyldopa (ALDOMET) 500 MG tablet TAKE 1 TABLET BY MOUTH TWICE A DAY  180 tablet  3  . Multiple Vitamin (MULTIVITAMIN) tablet Take 1 tablet by mouth daily.         No current  facility-administered medications on file prior to visit.     Review of Systems Pertinent items are noted in HPI.    Objective:    BP 160/72  Pulse 56  Temp(Src) 98.6 F (37 C) (Oral)  Wt 142 lb 3.2 oz (64.501 kg)  SpO2 95% General:  alert, cooperative, appears stated age and mild distress  Skin:  erythema noted on extremities, face and trunk     Assessment:    hives    Plan:    Medications: steroids: depo medrol and steroid taper and antihistamine. verbal and written  patient instruction given. Follow up in a few days. ---if needed

## 2013-03-17 NOTE — Progress Notes (Signed)
Pre visit review using our clinic review tool, if applicable. No additional management support is needed unless otherwise documented below in the visit note. 

## 2013-03-18 ENCOUNTER — Telehealth: Payer: Self-pay | Admitting: Internal Medicine

## 2013-03-18 NOTE — Telephone Encounter (Signed)
Relevant patient education assigned to patient using Emmi. ° °

## 2013-03-26 ENCOUNTER — Encounter: Payer: Self-pay | Admitting: Family Medicine

## 2013-03-26 ENCOUNTER — Ambulatory Visit (INDEPENDENT_AMBULATORY_CARE_PROVIDER_SITE_OTHER): Payer: Medicare Other | Admitting: Family Medicine

## 2013-03-26 VITALS — BP 170/86 | HR 86 | Temp 97.9°F | Ht 62.0 in | Wt 138.0 lb

## 2013-03-26 DIAGNOSIS — F411 Generalized anxiety disorder: Secondary | ICD-10-CM

## 2013-03-26 MED ORDER — LORAZEPAM 0.5 MG PO TABS: 0.5000 mg | ORAL_TABLET | Freq: Three times a day (TID) | ORAL | Status: DC | PRN

## 2013-03-26 NOTE — Progress Notes (Signed)
Pre visit review using our clinic review tool, if applicable. No additional management support is needed unless otherwise documented below in the visit note. 

## 2013-03-30 NOTE — Progress Notes (Signed)
   Subjective:    Patient ID: Jane Cox, female    DOB: 11-12-1933, 80 y.o.   MRN: [id number]  HPI Here to ask for help with stress. Her husband has a lot of health problems and she deals with daily stress. At times she feels quite anxious and finds it hard to do the things she needs to do. Sometimes sleep is difficult. Appetite is okay.    Review of Systems  Constitutional: Negative.   Psychiatric/Behavioral: Positive for decreased concentration. Negative for hallucinations, behavioral problems, confusion, dysphoric mood and agitation. The patient is nervous/anxious.        Objective:   Physical Exam  Constitutional: She is oriented to person, place, and time. She appears well-developed and well-nourished.  Neurological: She is alert and oriented to person, place, and time.  Psychiatric: She has a normal mood and affect. Her behavior is normal. Thought content normal.          Assessment & Plan:  Try Ativan prn and follow up with Dr. Elease Hashimoto.

## 2013-03-30 NOTE — Progress Notes (Signed)
   Subjective:    Patient ID: Jane Cox, female    DOB: [birth year], 80 y.o.   MRN: [id number]  HPI    Review of Systems     Objective:   Physical Exam        Assessment & Plan:

## 2013-04-05 ENCOUNTER — Telehealth: Payer: Self-pay | Admitting: Internal Medicine

## 2013-04-05 NOTE — Telephone Encounter (Signed)
Patient Information:  Caller Name: Becca  Phone: [phone]  Patient: Jane Cox, Jane Cox  Gender: Female  DOB: April 14, 1933  Age: 80 Years  PCP: Phoebe Sharps (Adults only)  Office Follow Up:  Does the office need to follow up with this patient?: Yes  Instructions For The Office: Please f/u with pt, thank you.  RN Note:  Contacted the office and spoke with Derinda Late and advised to send note to office.  After speaking with office pt states she her diatolic has not been out of the 53'M but the systolic is the higher readings. Pt is asymptomatic at this time.  Symptoms  Reason For Call & Symptoms: Pt reports increased BP 173/79 at 1000 retake 158/79.  Pt reports her husband has been in the hospital most of Feb. and she has been under more stress.  Pt reports he BP has ranged from 149/65 - 184/79.  Reviewed Health History In EMR: Yes  Reviewed Medications In EMR: Yes  Reviewed Allergies In EMR: Yes  Reviewed Surgeries / Procedures: Yes  Date of Onset of Symptoms: 04/05/2013  Guideline(s) Used:  High Blood Pressure  Disposition Per Guideline:   Go to ED Now (or to Office with PCP Approval)  Reason For Disposition Reached:   BP > 160 / 100 and any cardiac or neurologic symptoms (e.g., chest pain, difficulty breathing, unsteady gait, blurred vision)  Advice Given:  Call Back If:  You become worse.  Patient Will Follow Care Advice:  YES

## 2013-04-05 NOTE — Telephone Encounter (Signed)
Pt seen Dr. Sarajane Jews for anxiety in Feb and was given Ativan.

## 2013-04-06 NOTE — Telephone Encounter (Signed)
Could you answer for Dr Leanne Chang?

## 2013-04-07 NOTE — Telephone Encounter (Signed)
Increase amlodipine to 5 mg

## 2013-04-07 NOTE — Telephone Encounter (Signed)
Left message for pt to call back  °

## 2013-04-07 NOTE — Telephone Encounter (Signed)
Gave pt Dr Arnoldo Morale advice.  She stated she had an appt with Dr Leanne Chang on Monday and she will wait till then.  Told pt that if BP gets to high she will want to double up on the Norvasc so she doesn't haven't stroke.  Pt verbalized understanding and had no questions

## 2013-04-09 ENCOUNTER — Encounter: Payer: Self-pay | Admitting: *Deleted

## 2013-04-12 ENCOUNTER — Encounter: Payer: Self-pay | Admitting: Internal Medicine

## 2013-04-12 ENCOUNTER — Ambulatory Visit (INDEPENDENT_AMBULATORY_CARE_PROVIDER_SITE_OTHER): Payer: Medicare Other | Admitting: Internal Medicine

## 2013-04-12 VITALS — BP 162/80 | HR 84 | Temp 98.2°F | Ht 62.0 in | Wt 137.0 lb

## 2013-04-12 DIAGNOSIS — E785 Hyperlipidemia, unspecified: Secondary | ICD-10-CM

## 2013-04-12 DIAGNOSIS — Z853 Personal history of malignant neoplasm of breast: Secondary | ICD-10-CM | POA: Diagnosis not present

## 2013-04-12 DIAGNOSIS — I10 Essential (primary) hypertension: Secondary | ICD-10-CM | POA: Diagnosis not present

## 2013-04-12 LAB — HEPATIC FUNCTION PANEL
ALK PHOS: 78 U/L (ref 39–117)
ALT: 21 U/L (ref 0–35)
AST: 17 U/L (ref 0–37)
Albumin: 4.1 g/dL (ref 3.5–5.2)
BILIRUBIN DIRECT: 0.1 mg/dL (ref 0.0–0.3)
BILIRUBIN TOTAL: 1 mg/dL (ref 0.3–1.2)
Total Protein: 6.9 g/dL (ref 6.0–8.3)

## 2013-04-12 LAB — CBC WITH DIFFERENTIAL/PLATELET
BASOS ABS: 0 10*3/uL (ref 0.0–0.1)
Basophils Relative: 0.2 % (ref 0.0–3.0)
Eosinophils Absolute: 0.1 10*3/uL (ref 0.0–0.7)
Eosinophils Relative: 0.8 % (ref 0.0–5.0)
HEMATOCRIT: 35.2 % — AB (ref 36.0–46.0)
Hemoglobin: 11.7 g/dL — ABNORMAL LOW (ref 12.0–15.0)
Lymphocytes Relative: 18.6 % (ref 12.0–46.0)
Lymphs Abs: 1.3 10*3/uL (ref 0.7–4.0)
MCHC: 33.1 g/dL (ref 30.0–36.0)
MCV: 88.2 fl (ref 78.0–100.0)
MONOS PCT: 6.6 % (ref 3.0–12.0)
Monocytes Absolute: 0.5 10*3/uL (ref 0.1–1.0)
Neutro Abs: 5.1 10*3/uL (ref 1.4–7.7)
Neutrophils Relative %: 73.8 % (ref 43.0–77.0)
Platelets: 255 10*3/uL (ref 150.0–400.0)
RBC: 3.99 Mil/uL (ref 3.87–5.11)
RDW: 14.9 % — ABNORMAL HIGH (ref 11.5–14.6)
WBC: 6.9 10*3/uL (ref 4.5–10.5)

## 2013-04-12 LAB — LIPID PANEL
CHOL/HDL RATIO: 2
Cholesterol: 135 mg/dL (ref 0–200)
HDL: 59.1 mg/dL (ref >39.00)
LDL CALC: 51 mg/dL (ref 0–99)
TRIGLYCERIDES: 127 mg/dL (ref 0.0–149.0)
VLDL: 25.4 mg/dL (ref 0.0–40.0)

## 2013-04-12 LAB — BASIC METABOLIC PANEL
BUN: 10 mg/dL (ref 6–23)
CALCIUM: 9.5 mg/dL (ref 8.4–10.5)
CO2: 27 mEq/L (ref 19–32)
Chloride: 108 mEq/L (ref 96–112)
Creatinine, Ser: 0.6 mg/dL (ref 0.4–1.2)
GFR: 113.71 mL/min (ref >60.00)
Glucose, Bld: 99 mg/dL (ref 70–99)
POTASSIUM: 4 meq/L (ref 3.5–5.1)
Sodium: 141 mEq/L (ref 135–145)

## 2013-04-12 LAB — TSH: TSH: 1.18 u[IU]/mL (ref 0.35–5.50)

## 2013-04-12 MED ORDER — LABETALOL HCL 100 MG PO TABS: 200.0000 mg | ORAL_TABLET | Freq: Two times a day (BID) | ORAL | Status: DC

## 2013-04-12 MED ORDER — LABETALOL HCL 100 MG PO TABS: 100.0000 mg | ORAL_TABLET | Freq: Two times a day (BID) | ORAL | Status: DC

## 2013-04-12 MED ORDER — AMLODIPINE BESYLATE 2.5 MG PO TABS: 2.5000 mg | ORAL_TABLET | Freq: Every day | ORAL | Status: DC

## 2013-04-12 NOTE — Progress Notes (Signed)
htn- home bps have been elevated. She has been under a great deal of stress (husband ill: colitis and multiple other complications, he is now in SNF for rehab).  She has - been told to increase amlodipine to 5 mg (from 2.5 mg).   Breast cancer- remote hx  Lipids- tolerating meds, needs f/u  Past Medical History  Diagnosis Date  . Allergy   . HX: breast cancer   . Hypertension   . Basal cell carcinoma   . Osteopenia   . Hyperlipidemia     History   Social History  . Marital Status: Married    Spouse Name: N/A    Number of Children: N/A  . Years of Education: N/A   Occupational History  . Not on file.   Social History Main Topics  . Smoking status: Never Smoker   . Smokeless tobacco: Never Used  . Alcohol Use: 0.5 oz/week    1 drink(s) per week  . Drug Use: No  . Sexual Activity: Not on file   Other Topics Concern  . Not on file   Social History Narrative  . No narrative on file    Past Surgical History  Procedure Laterality Date  . Cholecystectomy  1993  . Abdominal hysterectomy  1983  . Breast surgery      mastectomy  . Vein surgery    . Colonoscopy      Family History  Problem Relation Age of Onset  . Lung cancer Father     Allergies  Allergen Reactions  . Bacitracin-Polymyxin B     REACTION: rash  . Neosporin [Neomycin-Polymyxin-Gramicidin]     Current Outpatient Prescriptions on File Prior to Visit  Medication Sig Dispense Refill  . amLODipine (NORVASC) 2.5 MG tablet Take 1 tablet (2.5 mg total) by mouth daily.  90 tablet  1  . atorvastatin (LIPITOR) 20 MG tablet TAKE 1 TABLET (20 MG TOTAL) BY MOUTH DAILY.  90 tablet  2  . lisinopril (PRINIVIL,ZESTRIL) 40 MG tablet TAKE 1 TABLET (40 MG TOTAL) BY MOUTH DAILY.  90 tablet  0  . methyldopa (ALDOMET) 500 MG tablet TAKE 1 TABLET BY MOUTH TWICE A DAY  180 tablet  3  . Multiple Vitamin (MULTIVITAMIN) tablet Take 1 tablet by mouth daily.         No current facility-administered medications on file  prior to visit.     patient denies chest pain, shortness of breath, orthopnea. Denies lower extremity edema, abdominal pain, change in appetite, change in bowel movements. Patient denies rashes, musculoskeletal complaints. No other specific complaints in a complete review of systems.   .reviewed vitals  Well-developed well-nourished female in no acute distress. HEENT exam atraumatic, normocephalic, extraocular muscles are intact. Neck is supple. No jugular venous distention no thyromegaly. Chest clear to auscultation without increased work of breathing. Cardiac exam S1 and S2 are regular. Abdominal exam active bowel sounds, soft, nontender. Extremities no edema. Neurologic exam she is alert without any motor sensory deficits. Gait is normal.

## 2013-04-12 NOTE — Addendum Note (Signed)
Addended by: Donnita Falls on: 04/12/2013 09:46 AM   Modules accepted: Orders

## 2013-04-12 NOTE — Assessment & Plan Note (Addendum)
Not Well controlled Continue same meds except decrease amlodipine back to 2.5 mg Will add low dose beta blocker

## 2013-04-12 NOTE — Progress Notes (Signed)
Pre visit review using our clinic review tool, if applicable. No additional management support is needed unless otherwise documented below in the visit note. 

## 2013-04-12 NOTE — Assessment & Plan Note (Signed)
No known recurrence 

## 2013-04-12 NOTE — Assessment & Plan Note (Signed)
Tolerating meds Continue same Check labs today

## 2013-04-13 ENCOUNTER — Telehealth: Payer: Self-pay | Admitting: Internal Medicine

## 2013-04-13 NOTE — Telephone Encounter (Signed)
Relevant patient education assigned to patient using Emmi. ° °

## 2013-05-10 ENCOUNTER — Ambulatory Visit (INDEPENDENT_AMBULATORY_CARE_PROVIDER_SITE_OTHER): Payer: Medicare Other | Admitting: *Deleted

## 2013-05-10 VITALS — BP 138/70

## 2013-05-10 DIAGNOSIS — I10 Essential (primary) hypertension: Secondary | ICD-10-CM

## 2013-05-10 DIAGNOSIS — Z23 Encounter for immunization: Secondary | ICD-10-CM

## 2013-05-10 NOTE — Progress Notes (Signed)
Pt comes in today for a nurse visit bp check.  She does her check her BP at home.  Lowest reading is 125/56 and highest was 161/68.  Her bp today was 150/72.  Had pt sit for a few minutes and it went down to 138/70. She stated that she had ran into some traffic and it takes her about 30 minutes to get here.  Told pt to continue current medicines and if Dr Leanne Chang wanted to change anything I would call her.  Note forwarded to Dr Leanne Chang for review

## 2013-05-24 ENCOUNTER — Other Ambulatory Visit: Payer: Self-pay | Admitting: Internal Medicine

## 2013-06-03 ENCOUNTER — Ambulatory Visit (INDEPENDENT_AMBULATORY_CARE_PROVIDER_SITE_OTHER): Payer: Medicare Other | Admitting: Internal Medicine

## 2013-06-03 ENCOUNTER — Encounter: Payer: Self-pay | Admitting: Internal Medicine

## 2013-06-03 VITALS — BP 130/70 | HR 64 | Temp 98.1°F | Ht 63.0 in | Wt 142.0 lb

## 2013-06-03 DIAGNOSIS — I10 Essential (primary) hypertension: Secondary | ICD-10-CM

## 2013-06-03 DIAGNOSIS — D649 Anemia, unspecified: Secondary | ICD-10-CM | POA: Diagnosis not present

## 2013-06-03 DIAGNOSIS — Z0000 Encounter for general adult medical examination without abnormal findings: Secondary | ICD-10-CM

## 2013-06-03 DIAGNOSIS — E785 Hyperlipidemia, unspecified: Secondary | ICD-10-CM

## 2013-06-03 DIAGNOSIS — Z853 Personal history of malignant neoplasm of breast: Secondary | ICD-10-CM

## 2013-06-03 DIAGNOSIS — Z23 Encounter for immunization: Secondary | ICD-10-CM | POA: Diagnosis not present

## 2013-06-03 LAB — CBC WITH DIFFERENTIAL/PLATELET
Basophils Absolute: 0 10*3/uL (ref 0.0–0.1)
Basophils Relative: 0.3 % (ref 0.0–3.0)
Eosinophils Absolute: 0.1 10*3/uL (ref 0.0–0.7)
Eosinophils Relative: 0.9 % (ref 0.0–5.0)
HEMATOCRIT: 36.9 % (ref 36.0–46.0)
Hemoglobin: 12.2 g/dL (ref 12.0–15.0)
LYMPHS ABS: 1.7 10*3/uL (ref 0.7–4.0)
Lymphocytes Relative: 16.8 % (ref 12.0–46.0)
MCHC: 33.1 g/dL (ref 30.0–36.0)
MCV: 87.9 fl (ref 78.0–100.0)
MONOS PCT: 5.2 % (ref 3.0–12.0)
Monocytes Absolute: 0.5 10*3/uL (ref 0.1–1.0)
NEUTROS PCT: 76.8 % (ref 43.0–77.0)
Neutro Abs: 7.6 10*3/uL (ref 1.4–7.7)
PLATELETS: 246 10*3/uL (ref 150.0–400.0)
RBC: 4.2 Mil/uL (ref 3.87–5.11)
RDW: 15.1 % (ref 11.5–15.5)
WBC: 9.9 10*3/uL (ref 4.0–10.5)

## 2013-06-03 LAB — FERRITIN: FERRITIN: 60.9 ng/mL (ref 10.0–291.0)

## 2013-06-03 NOTE — Progress Notes (Signed)
htn- tolerating meds (home bps 130s/70)  Lipids- toelrating meds- would llike to stop cutting meds in 1/2 (currently 1/2 of 40 mg)  Breast ca- remote   Past Medical History  Diagnosis Date  . Allergy   . HX: breast cancer   . Hypertension   . Basal cell carcinoma   . Osteopenia   . Hyperlipidemia     History   Social History  . Marital Status: Married    Spouse Name: N/A    Number of Children: N/A  . Years of Education: N/A   Occupational History  . Not on file.   Social History Main Topics  . Smoking status: Never Smoker   . Smokeless tobacco: Never Used  . Alcohol Use: 0.5 oz/week    1 drink(s) per week  . Drug Use: No  . Sexual Activity: Not on file   Other Topics Concern  . Not on file   Social History Narrative  . No narrative on file    Past Surgical History  Procedure Laterality Date  . Cholecystectomy  1993  . Abdominal hysterectomy  1983  . Breast surgery      mastectomy  . Vein surgery    . Colonoscopy      Family History  Problem Relation Age of Onset  . Lung cancer Father     Allergies  Allergen Reactions  . Bacitracin-Polymyxin B     REACTION: rash  . Neosporin [Neomycin-Polymyxin-Gramicidin]     Current Outpatient Prescriptions on File Prior to Visit  Medication Sig Dispense Refill  . amLODipine (NORVASC) 2.5 MG tablet Take 1 tablet (2.5 mg total) by mouth daily.  90 tablet  3  . labetalol (NORMODYNE) 100 MG tablet Take 1 tablet (100 mg total) by mouth 2 (two) times daily.  180 tablet  3  . lisinopril (PRINIVIL,ZESTRIL) 40 MG tablet TAKE 1 TABLET (40 MG TOTAL) BY MOUTH DAILY.  90 tablet  0  . methyldopa (ALDOMET) 500 MG tablet TAKE 1 TABLET BY MOUTH TWICE A DAY  180 tablet  3  . Multiple Vitamin (MULTIVITAMIN) tablet Take 1 tablet by mouth daily.         No current facility-administered medications on file prior to visit.     patient denies chest pain, shortness of breath, orthopnea. Denies lower extremity edema, abdominal pain,  change in appetite, change in bowel movements. Patient denies rashes, musculoskeletal complaints. No other specific complaints in a complete review of systems.   BP 160/84  Pulse 64  Temp(Src) 98.1 F (36.7 C) (Oral)  Ht 5\' 3"  (1.6 m)  Wt 142 lb (64.411 kg)  BMI 25.16 kg/m2  Well-developed well-nourished female in no acute distress. HEENT exam atraumatic, normocephalic, extraocular muscles are intact. Neck is supple. No jugular venous distention no thyromegaly. Chest clear to auscultation without increased work of breathing. Cardiac exam S1 and S2 are regular. Abdominal exam active bowel sounds, soft, nontender. Extremities no edema. Neurologic exam she is alert without any motor sensory deficits. Gait is normal.   BREAST CANCER, HX OF No known recurrence  HYPERTENSION Adequate control at home. Continue same meds- we have tried to unsuccessfully decrease meds  HYPERLIPIDEMIA Check labs today

## 2013-06-03 NOTE — Assessment & Plan Note (Signed)
No known recurrence 

## 2013-06-03 NOTE — Progress Notes (Signed)
Pre visit review using our clinic review tool, if applicable. No additional management support is needed unless otherwise documented below in the visit note. 

## 2013-06-03 NOTE — Assessment & Plan Note (Signed)
Adequate control at home. Continue same meds- we have tried to unsuccessfully decrease meds

## 2013-06-03 NOTE — Assessment & Plan Note (Signed)
Check labs today.

## 2013-06-04 ENCOUNTER — Encounter: Payer: Medicare Other | Admitting: Internal Medicine

## 2013-06-04 ENCOUNTER — Ambulatory Visit: Payer: Medicare Other | Admitting: Internal Medicine

## 2013-06-06 LAB — METHYLMALONIC ACID, SERUM: METHYLMALONIC ACID, QUANT: 83 nmol/L — AB (ref 87–318)

## 2013-08-22 ENCOUNTER — Other Ambulatory Visit: Payer: Self-pay | Admitting: Internal Medicine

## 2013-09-14 DIAGNOSIS — H25019 Cortical age-related cataract, unspecified eye: Secondary | ICD-10-CM | POA: Diagnosis not present

## 2013-09-14 DIAGNOSIS — H2510 Age-related nuclear cataract, unspecified eye: Secondary | ICD-10-CM | POA: Diagnosis not present

## 2013-09-14 DIAGNOSIS — H02839 Dermatochalasis of unspecified eye, unspecified eyelid: Secondary | ICD-10-CM | POA: Diagnosis not present

## 2013-09-14 DIAGNOSIS — H18419 Arcus senilis, unspecified eye: Secondary | ICD-10-CM | POA: Diagnosis not present

## 2013-10-05 DIAGNOSIS — D1801 Hemangioma of skin and subcutaneous tissue: Secondary | ICD-10-CM | POA: Diagnosis not present

## 2013-10-05 DIAGNOSIS — C44611 Basal cell carcinoma of skin of unspecified upper limb, including shoulder: Secondary | ICD-10-CM | POA: Diagnosis not present

## 2013-10-05 DIAGNOSIS — L570 Actinic keratosis: Secondary | ICD-10-CM | POA: Diagnosis not present

## 2013-10-05 DIAGNOSIS — L821 Other seborrheic keratosis: Secondary | ICD-10-CM | POA: Diagnosis not present

## 2013-10-05 DIAGNOSIS — L739 Follicular disorder, unspecified: Secondary | ICD-10-CM | POA: Diagnosis not present

## 2013-10-05 DIAGNOSIS — L218 Other seborrheic dermatitis: Secondary | ICD-10-CM | POA: Diagnosis not present

## 2013-10-05 DIAGNOSIS — L723 Sebaceous cyst: Secondary | ICD-10-CM | POA: Diagnosis not present

## 2013-10-05 DIAGNOSIS — D239 Other benign neoplasm of skin, unspecified: Secondary | ICD-10-CM | POA: Diagnosis not present

## 2013-10-05 DIAGNOSIS — C44621 Squamous cell carcinoma of skin of unspecified upper limb, including shoulder: Secondary | ICD-10-CM | POA: Diagnosis not present

## 2013-10-05 DIAGNOSIS — L7211 Pilar cyst: Secondary | ICD-10-CM | POA: Diagnosis not present

## 2013-10-05 DIAGNOSIS — Z85828 Personal history of other malignant neoplasm of skin: Secondary | ICD-10-CM | POA: Diagnosis not present

## 2013-11-05 DIAGNOSIS — Z23 Encounter for immunization: Secondary | ICD-10-CM | POA: Diagnosis not present

## 2013-11-10 DIAGNOSIS — Z85828 Personal history of other malignant neoplasm of skin: Secondary | ICD-10-CM | POA: Diagnosis not present

## 2013-11-10 DIAGNOSIS — C44619 Basal cell carcinoma of skin of left upper limb, including shoulder: Secondary | ICD-10-CM | POA: Diagnosis not present

## 2013-12-15 ENCOUNTER — Encounter: Payer: Self-pay | Admitting: Family Medicine

## 2013-12-15 ENCOUNTER — Ambulatory Visit (INDEPENDENT_AMBULATORY_CARE_PROVIDER_SITE_OTHER): Payer: Medicare Other | Admitting: Family Medicine

## 2013-12-15 VITALS — BP 160/68 | HR 70 | Temp 98.3°F | Wt 148.0 lb

## 2013-12-15 DIAGNOSIS — E785 Hyperlipidemia, unspecified: Secondary | ICD-10-CM

## 2013-12-15 DIAGNOSIS — I10 Essential (primary) hypertension: Secondary | ICD-10-CM

## 2013-12-15 DIAGNOSIS — I701 Atherosclerosis of renal artery: Secondary | ICD-10-CM

## 2013-12-15 MED ORDER — AMLODIPINE BESYLATE 10 MG PO TABS: 10.0000 mg | ORAL_TABLET | Freq: Every day | ORAL | Status: DC

## 2013-12-15 NOTE — Progress Notes (Signed)
Jane Reddish, MD Phone: [phone]  Subjective:  Patient presents today to establish care with me as their new primary care provider. Patient was formerly a patient of Dr. Leanne Chang. Chief complaint-noted.   Hypertension-poor control  renal artery stenosis-presumed stable BP Readings from Last 3 Encounters:  12/15/13 160/68  06/03/13 130/70  05/10/13 138/70  1996 narrowing of right renal artery-going to [place] clinic at that time. Angioplasty of that artery. No stent. Recheck under Dr. Leanne Chang was stable.  BP has increased since February as husband has become seriously ill in February (patient to be seen by me in Friday).  Home BP monitoring-around 140s over 60s.  Compliant with medications-yes without side effects ROS-Denies any CP, HA, SOB, blurry vision, LE edema, weakness, lightheadedness.   Hyperlipidemia-good control on atorvastatin 10mg   Lab Results  Component Value Date   LDLCALC 51 04/12/2013  ROS- no chest pain or shortness of breath. No myalgias  The following were reviewed and entered/updated in epic: Past Medical History  Diagnosis Date  . Allergy   . HX: breast cancer   . Hypertension   . Basal cell carcinoma   . Osteopenia   . Hyperlipidemia    Patient Active Problem List   Diagnosis Date Noted  . Renal artery stenosis 12/15/2013    Priority: Medium  . Hyperlipidemia 09/10/2006    Priority: Medium  . Essential hypertension 08/18/2006    Priority: Medium  . BREAST CANCER, HX OF 08/18/2006    Priority: Medium  . Allergic rhinitis 08/18/2006    Priority: Low  . Osteopenia 08/18/2006    Priority: Low   Past Surgical History  Procedure Laterality Date  . Cholecystectomy  1993  . Abdominal hysterectomy  1983  . Breast surgery      mastectomy  . Vein surgery    . Colonoscopy      Family History  Problem Relation Age of Onset  . Lung cancer Father     Medications- reviewed and updated Current Outpatient Prescriptions  Medication Sig Dispense  Refill  . amLODipine (NORVASC) 2.5mg  2.5mg  30 tablet 1  . atorvastatin (LIPITOR) 20 MG tablet TAKE 1/2 TABLET (20 MG TOTAL) BY MOUTH DAILY.    Marland Kitchen labetalol (NORMODYNE) 100 MG tablet Take 1 tablet (100 mg total) by mouth 2 (two) times daily. 180 tablet 3  . lisinopril (PRINIVIL,ZESTRIL) 40 MG tablet TAKE 1 TABLET (40 MG TOTAL) BY MOUTH DAILY. 90 tablet 3  . methyldopa (ALDOMET) 500 MG tablet TAKE 1 TABLET BY MOUTH TWICE A DAY 180 tablet 3  . Multiple Vitamin (MULTIVITAMIN) tablet Take 1 tablet by mouth daily.         Allergies-reviewed and updated Allergies  Allergen Reactions  . Bacitracin-Polymyxin B     REACTION: rash  . Neosporin [Neomycin-Polymyxin-Gramicidin]     History   Social History  . Marital Status: Married    Spouse Name: N/A    Number of Children: N/A  . Years of Education: N/A   Social History Main Topics  . Smoking status: Never Smoker   . Smokeless tobacco: Never Used  . Alcohol Use: 0.5 oz/week    1 drink(s) per week  . Drug Use: No  . Sexual Activity: None   Other Topics Concern  . None   Social History Narrative   Married. Husband ill ([facility] pt- prostate cancer 2009-radiation/seed implant with colitis-worsening. Colitis, CMV, bronchitis and flu in feb 2015-SNF afterwards and came home 1 month later with op PT)      1 son-bachelor (  patient here). No grandchildren.       Hobbies: ladies clubs previously before being home with husband, avid reader, doesn't enjoy outdoors.       Advanced Directives: Husband HCPOA. Plans for resuscitation.     ROS--See HPI   Objective: BP 160/68 mmHg  Pulse 70  Temp(Src) 98.3 F (36.8 C)  Wt 148 lb (67.132 kg) Gen: NAD, resting comfortably HEENT: Mucous membranes are moist. CV: RRR no murmurs rubs or gallops Lungs: CTAB no crackles, wheeze, rhonchi Abdomen: soft/nontender/nondistended/normal bowel sounds. Ext: no edema Skin: warm, dry   Assessment/Plan:  Essential hypertension Poor control on   Amlodipine 2.5mg , methyldopa, labetalol, lisinopril 40mg . Titrate up to 10mg  amlodipine. If poor control at follow up, consider repeat imaging of renal arteries. Home readings with several over 140 as well. States higher amlodipine not effective last time so we wi. l watch.   Hyperlipidemia Excellent control on atorvastatin 10mg -continue  Renal artery stenosis My concern is could worsening renal artery stenosis be making BP harder to control. We will attempt to increase amlodipine but will get imaging if need be (next visit if not controlled)  consider bmet as well   Meds ordered this encounter  Medications  . amLODipine (NORVASC) 10 MG tablet    Sig: Take 1 tablet (10 mg total) by mouth daily.    Dispense:  30 tablet    Refill:  1

## 2013-12-15 NOTE — Assessment & Plan Note (Signed)
Excellent control on atorvastatin 10mg -continue

## 2013-12-15 NOTE — Assessment & Plan Note (Signed)
My concern is could worsening renal artery stenosis be making BP harder to control. We will attempt to increase amlodipine but will get imaging if need be (next visit if not controlled)

## 2013-12-15 NOTE — Patient Instructions (Signed)
Blood Pressure  Increase amlodipine to 10mg  from 2.5mg   See me in 2 weeks  Consider imaging your renal arteries if not improving   Keep a log of your blood pressure

## 2013-12-15 NOTE — Assessment & Plan Note (Signed)
Poor control on  Amlodipine 2.5mg , methyldopa, labetalol, lisinopril 40mg . Titrate up to 10mg  amlodipine. If poor control at follow up, consider repeat imaging of renal arteries. Home readings with several over 140 as well. States higher amlodipine not effective last time so we wi. l watch.

## 2013-12-17 ENCOUNTER — Telehealth: Payer: Self-pay

## 2013-12-17 NOTE — Telephone Encounter (Signed)
Pt states she is only taking 5mg  of Amlodipine and will f/u in 4wks

## 2013-12-19 ENCOUNTER — Other Ambulatory Visit: Payer: Self-pay | Admitting: Internal Medicine

## 2013-12-28 ENCOUNTER — Telehealth: Payer: Self-pay | Admitting: Family Medicine

## 2013-12-28 DIAGNOSIS — I10 Essential (primary) hypertension: Secondary | ICD-10-CM

## 2013-12-28 MED ORDER — ATORVASTATIN CALCIUM 20 MG PO TABS: 20.0000 mg | ORAL_TABLET | Freq: Every day | ORAL | Status: DC

## 2013-12-28 MED ORDER — LABETALOL HCL 100 MG PO TABS: 100.0000 mg | ORAL_TABLET | Freq: Two times a day (BID) | ORAL | Status: DC

## 2013-12-28 NOTE — Telephone Encounter (Signed)
Pt needs refills on labetalol 100 mg #180 and atorvastatin 20 mg #90 with refills call [facility] [employer] [facility]

## 2013-12-28 NOTE — Telephone Encounter (Signed)
Medications refilled

## 2013-12-29 ENCOUNTER — Ambulatory Visit: Payer: Medicare Other | Admitting: Family Medicine

## 2014-01-17 ENCOUNTER — Ambulatory Visit (INDEPENDENT_AMBULATORY_CARE_PROVIDER_SITE_OTHER): Payer: Medicare Other | Admitting: Family Medicine

## 2014-01-17 ENCOUNTER — Encounter: Payer: Self-pay | Admitting: Family Medicine

## 2014-01-17 VITALS — BP 138/64 | HR 72 | Temp 98.2°F | Ht 63.0 in | Wt 145.0 lb

## 2014-01-17 DIAGNOSIS — E663 Overweight: Secondary | ICD-10-CM

## 2014-01-17 DIAGNOSIS — I10 Essential (primary) hypertension: Secondary | ICD-10-CM

## 2014-01-17 MED ORDER — AMLODIPINE BESYLATE 5 MG PO TABS: 5.0000 mg | ORAL_TABLET | Freq: Every day | ORAL | Status: DC

## 2014-01-17 NOTE — Assessment & Plan Note (Signed)
Well-controlled on amlodipine 5 mg with recent increased from 2.5 mg, methyldopa, labetalol, lisinopril. Continue current medications

## 2014-01-17 NOTE — Progress Notes (Signed)
  Garret Reddish, MD Phone: [phone]  Subjective:   Jane Cox is a 80 y.o. year old very pleasant female patient who presents with the following:  Hypertension-good control overweight-reasonable control  BP Readings from Last 3 Encounters:  01/17/14 138/64  12/15/13 160/68  06/03/13 130/70  Home BP monitoring-averaging approximately 134/64. Had 1 measurement over 140 with back pain but only 143.  Compliant with medications-yes with side effects, mild dry mouth in AM ROS-Denies any CP, HA, SOB, blurry vision, LE edema  Past Medical History- Patient Active Problem List   Diagnosis Date Noted  . Renal artery stenosis 12/15/2013    Priority: Medium  . Hyperlipidemia 09/10/2006    Priority: Medium  . Essential hypertension 08/18/2006    Priority: Medium  . BREAST CANCER, HX OF 08/18/2006    Priority: Medium  . Allergic rhinitis 08/18/2006    Priority: Low  . Osteopenia 08/18/2006    Priority: Low   Medications- reviewed and updated Current Outpatient Prescriptions  Medication Sig Dispense Refill  . amLODipine (NORVASC) 10 MG tablet Take 1 tablet (10 mg total) by mouth daily. 30 tablet 1  . atorvastatin (LIPITOR) 20 MG tablet Take 1 tablet (20 mg total) by mouth daily at 6 PM. 90 tablet 1  . labetalol (NORMODYNE) 100 MG tablet Take 1 tablet (100 mg total) by mouth 2 (two) times daily. 180 tablet 2  . lisinopril (PRINIVIL,ZESTRIL) 40 MG tablet TAKE 1 TABLET (40 MG TOTAL) BY MOUTH DAILY. 90 tablet 3  . methyldopa (ALDOMET) 500 MG tablet TAKE 1 TABLET BY MOUTH TWICE A DAY 180 tablet 2  . Multiple Vitamin (MULTIVITAMIN) tablet Take 1 tablet by mouth daily.       Objective: BP 138/64 mmHg  Pulse 72  Temp(Src) 98.2 F (36.8 C)  Ht 5\' 3"  (1.6 m)  Wt 145 lb (65.772 kg)  BMI 25.69 kg/m2  140/60 initially Gen: NAD, resting comfortably CV: RRR no murmurs rubs or gallops Lungs: CTAB no crackles, wheeze, rhonchi Abdomen: soft/nontender/nondistended/normal bowel  sounds. Ext: no edema Skin: warm, dry, no rash Neuro: grossly normal, moves all extremities   Assessment/Plan:  Essential hypertension Well-controlled on amlodipine 5 mg with recent increased from 2.5 mg, methyldopa, labetalol, lisinopril. Continue current medications  Overweight- discussed given age would not want patient to actively try to lose weight but if she exercise and eat well and happens to lose weight this would be reasonable.  >50% of 15 minute office visit was spent on counseling (weight goals, BP goals for home monitoring, discussing side effects and tolerability) and coordination of care  Follow up 6 months as long as home blood pressures well controlled.   Meds ordered this encounter  Medications  . amLODipine (NORVASC) 5 MG tablet    Sig: Take 1 tablet (5 mg total) by mouth daily.    Dispense:  90 tablet    Refill:  3

## 2014-01-17 NOTE — Patient Instructions (Addendum)
Refilled amlodipine 5mg  for 1 year.   Let's check in 6 months from now unless you notice a trend in blood pressure above 140/90 then see me sooner

## 2014-03-14 ENCOUNTER — Ambulatory Visit: Payer: Medicare Other | Admitting: Family Medicine

## 2014-03-24 ENCOUNTER — Ambulatory Visit (INDEPENDENT_AMBULATORY_CARE_PROVIDER_SITE_OTHER): Payer: Medicare Other | Admitting: Family Medicine

## 2014-03-24 ENCOUNTER — Encounter: Payer: Self-pay | Admitting: Family Medicine

## 2014-03-24 VITALS — BP 140/72 | Temp 98.1°F | Wt 146.0 lb

## 2014-03-24 DIAGNOSIS — M25552 Pain in left hip: Secondary | ICD-10-CM

## 2014-03-24 DIAGNOSIS — M5442 Lumbago with sciatica, left side: Secondary | ICD-10-CM | POA: Diagnosis not present

## 2014-03-24 MED ORDER — MELOXICAM 7.5 MG PO TABS: 7.5000 mg | ORAL_TABLET | Freq: Every day | ORAL | Status: DC

## 2014-03-24 NOTE — Progress Notes (Signed)
Garret Reddish, MD Phone: [phone]  Subjective:   Jane Cox is a 81 y.o. year old very pleasant female patient who presents with the following:  Low back and hip pain L November was moving furniture and hurt low back. Has had intermittent low level issues since that time. About 3 weeks ago left hip started hurting as well radiating into L leg all the way to the ankle. Does not have numbness/tingling/paresthesias though. Pain in back has increased in this time as well. No pain in groin.  5/10 throughout the day-can get as low as 3-4/10 with tylenol, aleve down to 2-3/10. Described as an ache. Has been taking 2 tylenol extra strength once a day. About twice a week has tried advil instead.   Worse with getting out of a chair and bending over. Sitting does not make pain worse.   ROS-No saddle anesthesia, bladder incontinence, fecal incontinence, weakness in extremity, numbness or tingling in extremity. History negative for trauma, history of cancer, fever, chills, unintentional weight loss, recent bacterial infection, recent IV drug use, HIV, pain worse at night or while supine.   Past Medical History- Patient Active Problem List   Diagnosis Date Noted  . Renal artery stenosis 12/15/2013    Priority: Medium  . Hyperlipidemia 09/10/2006    Priority: Medium  . Essential hypertension 08/18/2006    Priority: Medium  . BREAST CANCER, HX OF 08/18/2006    Priority: Medium  . Allergic rhinitis 08/18/2006    Priority: Low  . Osteopenia 08/18/2006    Priority: Low   Medications- reviewed and updated Current Outpatient Prescriptions  Medication Sig Dispense Refill  . amLODipine (NORVASC) 5 MG tablet Take 1 tablet (5 mg total) by mouth daily. 90 tablet 3  . atorvastatin (LIPITOR) 20 MG tablet Take 1 tablet (20 mg total) by mouth daily at 6 PM. 90 tablet 1  . labetalol (NORMODYNE) 100 MG tablet Take 1 tablet (100 mg total) by mouth 2 (two) times daily. 180 tablet 2  . lisinopril  (PRINIVIL,ZESTRIL) 40 MG tablet TAKE 1 TABLET (40 MG TOTAL) BY MOUTH DAILY. 90 tablet 3  . methyldopa (ALDOMET) 500 MG tablet TAKE 1 TABLET BY MOUTH TWICE A DAY 180 tablet 2  . Multiple Vitamin (MULTIVITAMIN) tablet Take 1 tablet by mouth daily.       Objective: BP 140/72 mmHg  Temp(Src) 98.1 F (36.7 C)  Wt 146 lb (66.225 kg) Gen: NAD, resting comfortably in chair Abdomen: soft/nontender/nondistended/normal bowel sounds. No rebound or guarding.  Ext: no edema Skin: warm, dry, no rash  Pain with palpation over L trochanteric bursa  Back - Normal skin, Spine with normal alignment and no deformity.  Patient around L4-L5 with pain to vertebral process palpation and Paraspinous muscles though no spasm.   Range of motion is full at neck slightly limited with flexion at lumbar spine.  Negative Straight leg raise and FABER.   Neuro- no saddle anesthesia, 5/5 strength lower extremities, 2+ reflexes   Assessment/Plan:  Low back and hip pain L Nonspecific low back pain for > 3 months that was mild with flare within 3 weeks. 3 months of pain concerning but really more acute pain has been over 3 weeks.   We discussed with duration of pain could seek imaging vs. Conservative trial medication first. Patient has had palpitations with prednisone. Doe shave history of renal artery stenosis so NSAIDs are somewhat concerning but few other options here. We will do a 7 day trial of mobic 7.5mg  as well  as icing for hip which seems to have some irritation of trochanteric bursa. If not 50% improvement in 7 days, will plan for lumbar films and bilateral hip films. I think SI joint arthritis/dysfunction is in differential. Straight leg raise negative and I doubt large bulging disc. Osteopenia known so compression fracture would be possible.    Return precautions advised. See AVS for further info.   Meds ordered this encounter  Medications  . meloxicam (MOBIC) 7.5 MG tablet    Sig: Take 1 tablet (7.5 mg total)  by mouth daily.    Dispense:  14 tablet    Refill:  0

## 2014-03-24 NOTE — Patient Instructions (Signed)
Low back and L Hip pain  Low back could be arthritis related but I do not like the duration.   Treat with meloxicam for 7 days  If you have not made at least 50% improvement, we will order x-ray of lumbar spine and both side of hip films.   If you have proved slightly more than 50%, we will do 7 more days of meloxicam  Ok to take maalox or zantac for upset stomach with this.   I would also Ice the left hip 3 x a day for 3 days for 20 minutes. You can ice low back as well. Switch to heat after 3 days  If x-rays were normal, consider physical therapy vs. Continued monitoring. Most back pain episodes resolve within 6 weeks regardless of what we do.

## 2014-04-04 ENCOUNTER — Encounter: Payer: Self-pay | Admitting: Family Medicine

## 2014-04-04 DIAGNOSIS — M25559 Pain in unspecified hip: Secondary | ICD-10-CM

## 2014-04-04 DIAGNOSIS — M545 Low back pain, unspecified: Secondary | ICD-10-CM

## 2014-04-11 ENCOUNTER — Ambulatory Visit (INDEPENDENT_AMBULATORY_CARE_PROVIDER_SITE_OTHER)
Admission: RE | Admit: 2014-04-11 | Discharge: 2014-04-11 | Disposition: A | Discharge status: Home / Self Care | Payer: Medicare Other | Source: Ambulatory Visit | Attending: Family Medicine | Admitting: Family Medicine

## 2014-04-11 DIAGNOSIS — M25559 Pain in unspecified hip: Secondary | ICD-10-CM | POA: Diagnosis not present

## 2014-04-11 DIAGNOSIS — M545 Low back pain, unspecified: Secondary | ICD-10-CM

## 2014-04-11 DIAGNOSIS — M160 Bilateral primary osteoarthritis of hip: Secondary | ICD-10-CM | POA: Diagnosis not present

## 2014-04-11 DIAGNOSIS — M5136 Other intervertebral disc degeneration, lumbar region: Secondary | ICD-10-CM | POA: Diagnosis not present

## 2014-04-12 ENCOUNTER — Encounter: Payer: Self-pay | Admitting: Family Medicine

## 2014-05-02 DIAGNOSIS — Z1231 Encounter for screening mammogram for malignant neoplasm of breast: Secondary | ICD-10-CM | POA: Diagnosis not present

## 2014-05-02 DIAGNOSIS — Z853 Personal history of malignant neoplasm of breast: Secondary | ICD-10-CM | POA: Diagnosis not present

## 2014-05-02 LAB — HM MAMMOGRAPHY: HM MAMMO: NORMAL

## 2014-05-04 ENCOUNTER — Encounter: Payer: Self-pay | Admitting: Family Medicine

## 2014-05-10 ENCOUNTER — Ambulatory Visit (INDEPENDENT_AMBULATORY_CARE_PROVIDER_SITE_OTHER): Payer: Medicare Other | Admitting: Family Medicine

## 2014-05-10 ENCOUNTER — Encounter: Payer: Self-pay | Admitting: Family Medicine

## 2014-05-10 VITALS — BP 152/76 | HR 64 | Temp 98.1°F | Ht 63.0 in | Wt 148.0 lb

## 2014-05-10 DIAGNOSIS — M545 Low back pain, unspecified: Secondary | ICD-10-CM

## 2014-05-10 DIAGNOSIS — I10 Essential (primary) hypertension: Secondary | ICD-10-CM

## 2014-05-10 NOTE — Progress Notes (Signed)
Pre visit review using our clinic review tool, if applicable. No additional management support is needed unless otherwise documented below in the visit note. 

## 2014-05-10 NOTE — Progress Notes (Signed)
Garret Reddish, MD Phone: [phone]  Subjective:   Jane Cox is a 81 y.o. year old very pleasant female patient who presents with the following:  Hypertension-poor control in office, reasonable control at home  BP Readings from Last 3 Encounters:  05/10/14 152/76  03/24/14 140/72  01/17/14 138/64   Home BP monitoring-usually in 120s/50s or 60s in AM and by afternoon maxes out around 140/70 or 80. Home cuff verified last year by [facility].  Compliant with medications-yes without side effects ROS-Denies any CP, HA, SOB, blurry vision, LE edema.   Low Back Pain -coninued pain since November when she was moving furniture. occasionally shoots into left leg but rarely. Also has had some L hip pain. Patient without history of many pain complaints. Continues to have 5/10 pain which is improved by aleve or tylenol down to about a 3/10 ache. We have wanted to avoid nsaids due to history renal artery stenosis. Still worse with getting out of chair and bending over but normally sitting does nto make worse. She had x-rays which showed degenerative changes and facet joint disease. History osteopenia. Seems to be hurting at higher levels now and has intermittent spasm R mid back.  ROS-No paresthesias, fecal or urinary incontinence.   Past Medical History- Patient Active Problem List   Diagnosis Date Noted  . Renal artery stenosis 12/15/2013    Priority: Medium  . Hyperlipidemia 09/10/2006    Priority: Medium  . Essential hypertension 08/18/2006    Priority: Medium  . BREAST CANCER, HX OF 08/18/2006    Priority: Medium  . Allergic rhinitis 08/18/2006    Priority: Low  . Osteopenia 08/18/2006    Priority: Low   Medications- reviewed and updated Current Outpatient Prescriptions  Medication Sig Dispense Refill  . amLODipine (NORVASC) 5 MG tablet Take 1 tablet (5 mg total) by mouth daily. 90 tablet 3  . atorvastatin (LIPITOR) 20 MG tablet Take 1 tablet (20 mg total) by mouth daily at 6 PM.  90 tablet 1  . labetalol (NORMODYNE) 100 MG tablet Take 1 tablet (100 mg total) by mouth 2 (two) times daily. 180 tablet 2  . lisinopril (PRINIVIL,ZESTRIL) 40 MG tablet TAKE 1 TABLET (40 MG TOTAL) BY MOUTH DAILY. 90 tablet 3  . methyldopa (ALDOMET) 500 MG tablet TAKE 1 TABLET BY MOUTH TWICE A DAY 180 tablet 2  . Multiple Vitamin (MULTIVITAMIN) tablet Take 1 tablet by mouth daily.         Objective: BP 168/80 mmHg  Pulse 64  Temp(Src) 98.1 F (36.7 C) (Oral)  Ht 5\' 3"  (1.6 m)  Wt 148 lb (67.132 kg)  BMI 26.22 kg/m2 Gen: NAD, resting comfortably in chair, difficulty with standing CV: RRR no murmurs rubs or gallops Lungs: CTAB no crackles, wheeze, rhonchi Abdomen: soft/nontender/nondistended/normal bowel sounds.  Ext: no edema Skin: warm, dry, no rash over back  Mild pain with palpation over L trochanteric bursa Back -  Spine with normal alignment and no deformity.  Patient now with midline pain higher than previous L4-L5 level but still lumbar spine. Paraspinous muscles tender but no no spasm.    Neuro- no saddle anesthesia, 5/5 strength lower extremities, 2+ reflexes   Assessment/Plan:  Essential hypertension Great control at home and cuff was verified 2015. I told patient if she is in 140-150 range here not ideal but if consistently above 150 would still likely increase BP meds. Hesitant to let her run above this with renal artery stenosis history.    Low Back Pain Patient  without a history of many pain complaints and she has had persistent low back and L hip pain for 6 months now without specific findings on exam and primarily DJD and facet joint arthritis as well as degenerative changes of hip.  -discussed ortho vs. Sports med referral -patient prefers SM as likely would not want surgery but would be willing to consider IR treatments for facet joint or epidural injections if needed. If did need surgery, would appreciate Dr. Thompson Caul expertise to refer to ideal person for her  care -she is also interested in finding ways to deal with her pain if this is simply DJD. I discussed with her that I thought advanced imaging should be considered given time frame of pain and I could order or Get Dr. Thompson Caul opinion first and she would appreciate his opinion.   Return precautions advised.   Orders Placed This Encounter  Procedures  . Ambulatory referral to Sports Medicine    Referral Priority:  Routine    Referral Type:  Consultation    Referred to Provider:  Lyndal Pulley, DO    Number of Visits Requested:  1

## 2014-05-10 NOTE — Patient Instructions (Signed)
Blood pressure high in office but looks pretty good at home, if future office readings above 150, likely would go up on amlodipine  Refer to sports medicine for evaluation and consideration of MRI and injections by Dr. Charlann Boxer of [facility]. They will call you within 2 weeks, call us if you don't here.

## 2014-05-10 NOTE — Assessment & Plan Note (Signed)
Great control at home and cuff was verified 2015. I told patient if she is in 140-150 range here not ideal but if consistently above 150 would still likely increase BP meds. Hesitant to let her run above this with renal artery stenosis history.

## 2014-05-23 ENCOUNTER — Ambulatory Visit (INDEPENDENT_AMBULATORY_CARE_PROVIDER_SITE_OTHER): Payer: Medicare Other | Admitting: Family Medicine

## 2014-05-23 ENCOUNTER — Encounter: Payer: Self-pay | Admitting: Family Medicine

## 2014-05-23 VITALS — BP 140/66 | HR 72 | Ht 63.0 in | Wt 147.0 lb

## 2014-05-23 DIAGNOSIS — M7061 Trochanteric bursitis, right hip: Secondary | ICD-10-CM | POA: Diagnosis not present

## 2014-05-23 DIAGNOSIS — M5136 Other intervertebral disc degeneration, lumbar region: Secondary | ICD-10-CM | POA: Diagnosis not present

## 2014-05-23 DIAGNOSIS — M1990 Unspecified osteoarthritis, unspecified site: Secondary | ICD-10-CM

## 2014-05-23 DIAGNOSIS — M1612 Unilateral primary osteoarthritis, left hip: Secondary | ICD-10-CM | POA: Insufficient documentation

## 2014-05-23 NOTE — Assessment & Plan Note (Signed)
Patient does have more of a degenerative disc disease at multiple levels. I do believe the patient's moderate arthritis of the hips bilaterally can be contributing as well. Patient elected to try conservative therapy and we discussed over-the-counter medications as well as timing of medications. We discussed icing regimen as well as home exercises. Patient did work with Product/process development scientist today that I think will be beneficial. Patient will follow-up with me again in 4 weeks. If any significant radicular symptoms occur more advance imaging may be necessary. Patient will elected to try to avoid any significant prescription medications at this time.

## 2014-05-23 NOTE — Patient Instructions (Signed)
Good to see you Ice 20 minutes 2 times daily. Usually after activity and before bed. Exercises 3 times a week.  Take tylenol 650 mg three times a day is the best evidence based medicine we have for arthritis.  Glucosamine sulfate 1500,mg a day is a supplement that has been shown to help moderate to severe arthritis. Vitamin D 2000 IU daily Tumeric 500mg  twice daily.  Capsaicin topically up to four times a day may also help with pain.  It's important that you continue to stay active. Controlling your weight is important.  Water aerobics and cycling with low resistance are the best two types of exercise for arthritis. Come back and see me in 3-4 weeks.

## 2014-05-23 NOTE — Progress Notes (Signed)
[name] [address] [place], [place] [address] Phone: [phone] Subjective:    I'm seeing this patient by the request  of:  HUNTER, STEPHEN, MD   CC: low back pain bilaterally [id number] Jane Cox is a 81 y.o. female coming in with complaint of low back pain bilaterally.patient has had this pain continuously since November. Patient determined or moving furniture and this seems to be the onset of the pain. Patient states when she makes certain movements she can have some mild radicular symptoms but this seems to be intermittently and infrequently. Patient states some of the pain seems to be mostly on the left hip as well.patient rates the severity of pain a 5 out of 10 but does improve with anti-inflammatories or Tylenol. Describes a 3 out of 10 dull aching sensation. Patient has to avoid anti-inflammatories secondary to her past medical history for renal artery stenosis.sometimes it seems to be worse after sitting a long amount of time and going from a seated to standing position or from a laying to sitting position.patient states that going upstairs on the left leg came be challenging. Patient denies though any nighttime awakening. Past Medical History  Diagnosis Date  . Allergy   . HX: breast cancer   . Hypertension   . Basal cell carcinoma   . Osteopenia   . Hyperlipidemia    Past Surgical History  Procedure Laterality Date  . Cholecystectomy  1993  . Abdominal hysterectomy  1983  . Breast surgery      mastectomy  . Vein surgery    . Colonoscopy     Allergies  Allergen Reactions  . Bacitracin-Polymyxin B     REACTION: rash  . Neosporin [Neomycin-Polymyxin-Gramicidin]    Family History  Problem Relation Age of Onset  . Lung cancer Father    History  Substance Use Topics  . Smoking status: Never Smoker   . Smokeless tobacco: Never Used  . Alcohol Use: 0.5 oz/week    1 drink(s) per week        Past medical history,  social, surgical and family history all reviewed in electronic medical record.   Review of Systems: No headache, visual changes, nausea, vomiting, diarrhea, constipation, dizziness, abdominal pain, skin rash, fevers, chills, night sweats, weight loss, swollen lymph nodes, body aches, joint swelling, muscle aches, chest pain, shortness of breath, mood changes.   Objective Blood pressure 140/66, pulse 72, height 5\' 3"  (1.6 m), weight 147 lb (66.679 kg), SpO2 97 %.  General: No apparent distress alert and oriented x3 mood and affect normal, dressed appropriately.  HEENT: Pupils equal, extraocular movements intact  Respiratory: Patient's speak in full sentences and does not appear short of breath  Cardiovascular: No lower extremity edema, non tender, no erythema  Skin: Warm dry intact with no signs of infection or rash on extremities or on axial skeleton.  Abdomen: Soft nontender  Neuro: Cranial nerves II through XII are intact, neurovascularly intact in all extremities with 2+ DTRs and 2+ pulses.  Lymph: No lymphadenopathy of posterior or anterior cervical chain or axillae bilaterally.  Gait normal with good balance and coordination.  MSK:  Non tender with full range of motion and good stability and symmetric strength and tone of shoulders, elbows, wrist, hip, knee and ankles bilaterally.  Back Exam:  Inspection: increasing kyphosis and decrease in lordosis of the lumbar spine Motion: Flexion 35 deg, Extension 35 deg, Side Bending to 30 deg bilaterally,  Rotation to 30 deg  bilaterally  SLR laying: Negative  XSLR laying: Negative  Palpable tenderness: tender over the right paraspinal musculature FABER: positive bilateral with pain on the right side Sensory change: Gross sensation intact to all lumbar and sacral dermatomes.  Reflexes: 2+ at both patellar tendons, 2+ at achilles tendons, Babinski's downgoing.  Strength at foot  Plantar-flexion: 5/5 Dorsi-flexion: 5/5 Eversion: 5/5 Inversion: 5/5   Leg strength  Quad: 5/5 Hamstring: 5/5 Hip flexor: 5/5 Hip abductors: 3/5 symmetric Gait unremarkable. Patient's left hip does have decrease of 10 of internal rotation compared to 25 of the opposite side. Patient is tender to palpation over the right greater trochanteric area.  Procedure note 29518; 15 minutes spent for Therapeutic exercises as stated in above notes.  This included exercises focusing on stretching, strengthening, with significant focus on eccentric aspects.  Low back exercises that included:  Pelvic tilt/bracing instruction to focus on control of the pelvic girdle and lower abdominal muscles  Glute strengthening exercises, focusing on proper firing of the glutes without engaging the low back muscles Proper stretching techniques for maximum relief for the hamstrings, hip flexors, low back and some rotation where tolerated Proper technique shown and discussed handout in great detail with ATC.  All questions were discussed and answered.      Impression and Recommendations:     This case required medical decision making of moderate complexity.

## 2014-05-23 NOTE — Progress Notes (Signed)
Pre visit review using our clinic review tool, if applicable. No additional management support is needed unless otherwise documented below in the visit note. 

## 2014-05-30 ENCOUNTER — Telehealth: Payer: Self-pay | Admitting: Family Medicine

## 2014-05-30 NOTE — Telephone Encounter (Signed)
See below

## 2014-05-30 NOTE — Telephone Encounter (Signed)
This is a clinical callback.

## 2014-05-30 NOTE — Telephone Encounter (Signed)
As long as no chest pain, shortness of breath, continued worsening of swelling, redness in legs, not ill appearing, no fever/chills. No new medications other than amlodipine.No mucus membrane involvement.

## 2014-05-30 NOTE — Telephone Encounter (Signed)
Pt made aware of Dr. Yong Channel message below.

## 2014-05-30 NOTE — Telephone Encounter (Signed)
Patient called stating she needed an appointment due to swelling in ankles and rash from taking amlodipine.  Patient did not want any appointments offered sooner than Friday. Please advise if you think it is okay for her to wait?

## 2014-05-31 ENCOUNTER — Ambulatory Visit: Payer: Medicare Other | Admitting: Family Medicine

## 2014-06-03 ENCOUNTER — Ambulatory Visit: Payer: Medicare Other | Admitting: Family Medicine

## 2014-06-14 ENCOUNTER — Ambulatory Visit (INDEPENDENT_AMBULATORY_CARE_PROVIDER_SITE_OTHER): Payer: Medicare Other | Admitting: Family Medicine

## 2014-06-14 ENCOUNTER — Encounter: Payer: Self-pay | Admitting: Family Medicine

## 2014-06-14 VITALS — BP 132/64 | HR 73 | Ht 63.0 in | Wt 147.0 lb

## 2014-06-14 DIAGNOSIS — M5416 Radiculopathy, lumbar region: Secondary | ICD-10-CM

## 2014-06-14 DIAGNOSIS — M5136 Other intervertebral disc degeneration, lumbar region: Secondary | ICD-10-CM

## 2014-06-14 NOTE — Progress Notes (Signed)
Pre visit review using our clinic review tool, if applicable. No additional management support is needed unless otherwise documented below in the visit note. 

## 2014-06-14 NOTE — Assessment & Plan Note (Signed)
Asian is improving with conservative therapy. Patient does have significant priorities with taking care of her husband who has chronic morbidities. Patient will go to formal physical therapy and patient was given phase II exercises that I think will be beneficial. I do think the patient will do better with hands-on approach from physical therapy and we'll see if other modalities could be beneficial. Patient then will continue the natural supplementations and see me again in 4-6 weeks for further evaluation and treatment.  Spent  25 minutes with patient face-to-face and had greater than 50% of counseling including as described above in assessment and plan.

## 2014-06-14 NOTE — Progress Notes (Signed)
[name] [address], [place] [address] Phone: [phone] Subjective:    CC: low back pain bilaterally GBM:[id number] Jane Cox is a 81 y.o. female coming in with complaint of low back pain bilaterally.patient has had this pain continuously since November.  Patient was seen previously one month ago and does have degenerative changes of the back and hip. Patient elected try conservative therapy with home exercises, icing protocol, and over-the-counter medications. Patient states she has not been doing the exercises on a regular basis with her being the primary caregiver for her husband. Patient though has been taking the over-the-counter natural supplementations we discussed as well as Tylenol on a regular basis. Patient states that she is approximately 50-60% better. Patient though would like a little more direction on the exercises. Patient states that this pain is not stopping her from any activity and denies any radiation of pain.   Past Medical History  Diagnosis Date  . Allergy   . HX: breast cancer   . Hypertension   . Basal cell carcinoma   . Osteopenia   . Hyperlipidemia    Past Surgical History  Procedure Laterality Date  . Cholecystectomy  1993  . Abdominal hysterectomy  1983  . Breast surgery      mastectomy  . Vein surgery    . Colonoscopy     Allergies  Allergen Reactions  . Bacitracin-Polymyxin B     REACTION: rash  . Neosporin [Neomycin-Polymyxin-Gramicidin]    Family History  Problem Relation Age of Onset  . Lung cancer Father    History  Substance Use Topics  . Smoking status: Never Smoker   . Smokeless tobacco: Never Used  . Alcohol Use: 0.5 oz/week    1 drink(s) per week        Past medical history, social, surgical and family history all reviewed in electronic medical record.   Review of Systems: No headache, visual changes, nausea, vomiting, diarrhea, constipation, dizziness, abdominal pain,  skin rash, fevers, chills, night sweats, weight loss, swollen lymph nodes, body aches, joint swelling, muscle aches, chest pain, shortness of breath, mood changes.   Objective Blood pressure 132/64, pulse 73, height 5\' 3"  (1.6 m), weight 147 lb (66.679 kg), SpO2 97 %.  General: No apparent distress alert and oriented x3 mood and affect normal, dressed appropriately.  HEENT: Pupils equal, extraocular movements intact  Respiratory: Patient's speak in full sentences and does not appear short of breath  Cardiovascular: No lower extremity edema, non tender, no erythema  Skin: Warm dry intact with no signs of infection or rash on extremities or on axial skeleton.  Abdomen: Soft nontender  Neuro: Cranial nerves II through XII are intact, neurovascularly intact in all extremities with 2+ DTRs and 2+ pulses.  Lymph: No lymphadenopathy of posterior or anterior cervical chain or axillae bilaterally.  Gait normal with good balance and coordination.  MSK:  Non tender with full range of motion and good stability and symmetric strength and tone of shoulders, elbows, wrist, hip, knee and ankles bilaterally. Mild to moderate osteophytic changes of multiple joints Back Exam:  Inspection: increasing kyphosis and decrease in lordosis of the lumbar spine Motion: Flexion 35 deg, Extension 35 deg, Side Bending to 30 deg bilaterally,  Rotation to 30 deg bilaterally  SLR laying: Negative  XSLR laying: Negative  Palpable tenderness: tender over the right paraspinal musculature FABER: positive bilateral with pain on the right side but less than previous exam Sensory change: Gross  sensation intact to all lumbar and sacral dermatomes.  Reflexes: 2+ at both patellar tendons, 2+ at achilles tendons, Babinski's downgoing.  Strength at foot  Plantar-flexion: 5/5 Dorsi-flexion: 5/5 Eversion: 5/5 Inversion: 5/5  Leg strength  Quad: 5/5 Hamstring: 5/5 Hip flexor: 5/5 Hip abductors: 3/5 symmetric Gait  unremarkable. Patient's left hip does have decrease of 10 of internal rotation compared to 25 of the opposite side. Nontender on lateral aspect of the hip which is an improvement.       Impression and Recommendations:     This case required medical decision making of moderate complexity.

## 2014-06-14 NOTE — Patient Instructions (Addendum)
Good to see you Ice is your friend when needed Continue the vitamins Physical therapy will be calling you You are doing great!!!! See me again in 6 weeks after physical therapy or call if you need me.

## 2014-06-22 ENCOUNTER — Encounter: Payer: Self-pay | Admitting: Physical Therapy

## 2014-06-22 ENCOUNTER — Ambulatory Visit: Payer: Medicare Other | Attending: Family Medicine | Admitting: Physical Therapy

## 2014-06-22 DIAGNOSIS — R262 Difficulty in walking, not elsewhere classified: Secondary | ICD-10-CM

## 2014-06-22 DIAGNOSIS — M5442 Lumbago with sciatica, left side: Secondary | ICD-10-CM | POA: Insufficient documentation

## 2014-06-22 NOTE — Therapy (Signed)
[address] [place], [place], [address] Phone: [phone]   Fax:  [phone]  Physical Therapy Evaluation  Patient Details  Name: Jane Cox MRN: [id number] Date of Birth: Sep 17, 1933 Referring Provider:  Lyndal Pulley, DO  Encounter Date: 06/22/2014      PT End of Session - 06/22/14 1604    Visit Number 1   Date for PT Re-Evaluation 08/22/14   PT Start Time 1425   PT Stop Time 1515   PT Time Calculation (min) 50 min      Past Medical History  Diagnosis Date  . Allergy   . HX: breast cancer   . Hypertension   . Basal cell carcinoma   . Osteopenia   . Hyperlipidemia     Past Surgical History  Procedure Laterality Date  . Cholecystectomy  1993  . Abdominal hysterectomy  1983  . Breast surgery      mastectomy  . Vein surgery    . Colonoscopy      There were no vitals filed for this visit.  Visit Diagnosis:  Bilateral low back pain with left-sided sciatica - Plan: PT plan of care cert/re-cert  Difficulty walking - Plan: PT plan of care cert/re-cert      Subjective Assessment - 06/22/14 1449    Subjective Patient reports that she has been having low back and hip pain since November 2015.  Reports it was probably her moving furniture but no specific cause.  X-rays show DDD and arthritis of the lumbar spine   Limitations Sitting;Lifting;Standing;Walking;House hold activities   How long can you sit comfortably? 10   How long can you stand comfortably? 10   How long can you walk comfortably? 15   Diagnostic tests x-rays   Patient Stated Goals less pain   Currently in Pain? Yes   Pain Score 5    Pain Location Back   Pain Orientation Lower   Pain Descriptors / Indicators Aching;Tightness   Pain Type Chronic pain   Pain Onset More than a month ago   Pain Frequency Constant   Aggravating Factors  sitting   Pain Relieving Factors lying down   Effect of Pain on Daily Activities  reports difficulty with putting on socks and shoes, would like to be abl eto sit and dow housework without pain            [facility] PT Assessment - 06/22/14 0001    Assessment   Medical Diagnosis low back pain   Onset Date/Surgical Date 01/21/14   Prior Therapy no   Precautions   Precautions None   Balance Screen   Has the patient fallen in the past 6 months No   Has the patient had a decrease in activity level because of a fear of falling?  No   Is the patient reluctant to leave their home because of a fear of falling?  No   Home Environment   Additional Comments lives at home has to care for husband for some things   Prior Function   Level of Independence Independent   Leisure no exercise   ROM / Strength   AROM / PROM / Strength AROM;Strength   AROM   Overall AROM Comments lumbar ROM decreased 75% for all motions, stiff and sore, but also fearful   Strength   Overall Strength Comments of the LE's 4-/5 due to pain   Flexibility   Soft Tissue Assessment /Muscle Length --  tight  HS, calves, piriformis and ITB   Palpation   Palpation comment tight and tender in the lumbar paraspinals and into the buttocks, reports worse on the left than the right   Ambulation/Gait   Gait Comments no device, slow, slightly stooped forward trunk                   [facility] Adult PT Treatment/Exercise - 07-05-2014 0001    Modalities   Modalities Electrical Stimulation;Moist Heat   Moist Heat Therapy   Number Minutes Moist Heat 15 Minutes   Moist Heat Location Lumbar Spine   Electrical Stimulation   Electrical Stimulation Location lumbar spine   Electrical Stimulation Parameters IFC   Electrical Stimulation Goals Pain                PT Education - July 05, 2014 1603    Education provided Yes   Education Details Wms flexion exercises   Person(s) Educated Patient   Methods Explanation;Handout;Demonstration;Tactile cues   Comprehension Verbalized understanding;Returned  demonstration;Tactile cues required          PT Short Term Goals - 2014/07/05 1607    PT SHORT TERM GOAL #1   Title independent with initial HEP   Time 2   Period Weeks   Status New           PT Long Term Goals - 2014-07-05 1608    PT LONG TERM GOAL #1   Title understand and demonstrate proper posture and body mechanics   Time 8   Period Weeks   Status New   PT LONG TERM GOAL #2   Title increase lumbar ROM 25%   Time 8   Period Weeks   Status New   PT LONG TERM GOAL #3   Title decrease pain 50%   Time 8   Period Weeks   Status New   PT LONG TERM GOAL #4   Title walk 20 minutes without difficulty   Time 8   Period Weeks   Status New               Plan - 07/05/2014 1604    Clinical Impression Statement Patient with spasms and pain in the low back, reports difficulty walking, getting up out of bed and doing housework.  She has lumbar ROM that is decreased by 75%   Pt will benefit from skilled therapeutic intervention in order to improve on the following deficits Decreased mobility;Decreased range of motion;Difficulty walking;Decreased strength;Increased muscle spasms;Pain   Rehab Potential Good   PT Frequency 2x / week   PT Duration 8 weeks   PT Treatment/Interventions ADLs/Self Care Home Management;Electrical Stimulation;Moist Heat;Traction;Ultrasound;Therapeutic exercise;Therapeutic activities;Patient/family education;Manual techniques   PT Next Visit Plan Will slowly add exercises to include flexibility, she would like to resume walking as an exercise   Consulted and Agree with Plan of Care Patient          G-Codes - 07/05/2014 1610    Functional Assessment Tool Used foto   Functional Limitation Other PT primary   Other PT Primary Current Status (F6433) At least 40 percent but less than 60 percent impaired, limited or restricted   Other PT Primary Goal Status (I9518) At least 20 percent but less than 40 percent impaired, limited or restricted        Problem List Patient Active Problem List   Diagnosis Date Noted  . Lumbar degenerative disc disease 05/23/2014  . Arthritis of left hip 05/23/2014  . Greater trochanteric bursitis of right hip 05/23/2014  .  Renal artery stenosis 12/15/2013  . Hyperlipidemia 09/10/2006  . Essential hypertension 08/18/2006  . Allergic rhinitis 08/18/2006  . Osteopenia 08/18/2006  . BREAST CANCER, HX OF 08/18/2006    Sumner Boast., PT 06/22/2014, 4:12 PM  [address], [place], [address] Phone: [phone]   Fax:  [phone]

## 2014-07-05 ENCOUNTER — Encounter: Payer: Self-pay | Admitting: Physical Therapy

## 2014-07-05 ENCOUNTER — Ambulatory Visit: Payer: Medicare Other | Attending: Family Medicine | Admitting: Physical Therapy

## 2014-07-05 DIAGNOSIS — M5442 Lumbago with sciatica, left side: Secondary | ICD-10-CM | POA: Insufficient documentation

## 2014-07-05 DIAGNOSIS — R262 Difficulty in walking, not elsewhere classified: Secondary | ICD-10-CM | POA: Insufficient documentation

## 2014-07-05 NOTE — Therapy (Signed)
[place], [place], [address] Phone: [phone]   Fax:  [phone]  Physical Therapy Treatment  Patient Details  Name: Jane Cox MRN: [id number] Date of Birth: 1933/06/01 Referring Provider:  Lyndal Pulley, DO  Encounter Date: 07/05/2014      PT End of Session - 07/05/14 1432    Visit Number 2   Date for PT Re-Evaluation 08/22/14   PT Start Time 1400   PT Stop Time 1450   PT Time Calculation (min) 50 min      Past Medical History  Diagnosis Date  . Allergy   . HX: breast cancer   . Hypertension   . Basal cell carcinoma   . Osteopenia   . Hyperlipidemia     Past Surgical History  Procedure Laterality Date  . Cholecystectomy  1993  . Abdominal hysterectomy  1983  . Breast surgery      mastectomy  . Vein surgery    . Colonoscopy      There were no vitals filed for this visit.  Visit Diagnosis:  Bilateral low back pain with left-sided sciatica      Subjective Assessment - 07/05/14 1403    Subjective HEP is okay, good relief with estim   Currently in Pain? Yes   Pain Score 6    Pain Location Back   Pain Orientation Lower                         [facility] Adult PT Treatment/Exercise - 07/05/14 0001    Bed Mobility   Bed Mobility --  EDUCTAED IN LOG ROLLING   Posture/Postural Control   Posture Comments educated in sitting posture and use of lumbar support   Exercises   Exercises Lumbar   Lumbar Exercises: Stretches   Passive Hamstring Stretch 3 reps;30 seconds   Lower Trunk Rotation 3 reps;30 seconds   ITB Stretch 3 reps;30 seconds   Lumbar Exercises: Aerobic   Stationary Bike Nustep L4 5 min   Lumbar Exercises: Machines for Strengthening   Cybex Lumbar Extension red tband 15 times standing   Other Lumbar Machine Exercise hip ext and abd 10 times each red tband standing   Other Lumbar Machine Exercise sit fit pelvic ROM   Lumbar Exercises:  Supine   Ab Set 15 reps;3 seconds   AB Set Limitations isometric abdominals with ball   Bridge 15 reps  bridge, KTC, obl all with ball   Isometric Hip Flexion 10 reps;3 seconds   Modalities   Modalities Electrical Stimulation;Moist Heat   Moist Heat Therapy   Number Minutes Moist Heat 15 Minutes   Moist Heat Location Lumbar Spine   Electrical Stimulation   Electrical Stimulation Location lumbar spine   Electrical Stimulation Parameters IFC   Electrical Stimulation Goals Pain                PT Education - 07/05/14 1432    Education Details log rolling, lumbar support   Person(s) Educated Patient   Methods Explanation;Demonstration   Comprehension Verbalized understanding;Returned demonstration          PT Short Term Goals - 06/22/14 1607    PT SHORT TERM GOAL #1   Title independent with initial HEP   Time 2   Period Weeks   Status New           PT Long Term Goals - 06/22/14 1608    PT  LONG TERM GOAL #1   Title understand and demonstrate proper posture and body mechanics   Time 8   Period Weeks   Status New   PT LONG TERM GOAL #2   Title increase lumbar ROM 25%   Time 8   Period Weeks   Status New   PT LONG TERM GOAL #3   Title decrease pain 50%   Time 8   Period Weeks   Status New   PT LONG TERM GOAL #4   Title walk 20 minutes without difficulty   Time 8   Period Weeks   Status New               Plan - 07/05/14 1433    Clinical Impression Statement pt with very tight LE which did improve after MT, educated on log rolling and use of lumbar support. tolerated basic ther ex with some increased lumbar pain   PT Next Visit Plan progress ther ex and continue with PROM        Problem List Patient Active Problem List   Diagnosis Date Noted  . Lumbar degenerative disc disease 05/23/2014  . Arthritis of left hip 05/23/2014  . Greater trochanteric bursitis of right hip 05/23/2014  . Renal artery stenosis 12/15/2013  . Hyperlipidemia  09/10/2006  . Essential hypertension 08/18/2006  . Allergic rhinitis 08/18/2006  . Osteopenia 08/18/2006  . BREAST CANCER, HX OF 08/18/2006    Shylo Dillenbeck,ANGIE PTA 07/05/2014, 2:35 PM  [address], [place], [address] Phone: [phone]   Fax:  [phone]

## 2014-07-12 ENCOUNTER — Encounter: Payer: Self-pay | Admitting: Physical Therapy

## 2014-07-12 ENCOUNTER — Ambulatory Visit: Payer: Medicare Other | Admitting: Physical Therapy

## 2014-07-12 DIAGNOSIS — M5442 Lumbago with sciatica, left side: Secondary | ICD-10-CM

## 2014-07-12 DIAGNOSIS — R262 Difficulty in walking, not elsewhere classified: Secondary | ICD-10-CM | POA: Diagnosis not present

## 2014-07-12 NOTE — Therapy (Signed)
[place], [place], [address] Phone: [phone]   Fax:  [phone]  Physical Therapy Treatment  Patient Details  Name: Jane Cox MRN: [id number] Date of Birth: 09-18-1933 Referring Provider:  Marin Olp, MD  Encounter Date: 07/12/2014      PT End of Session - 07/12/14 1427    Visit Number 3   Date for PT Re-Evaluation 08/22/14   PT Start Time 6962   PT Stop Time 1450   PT Time Calculation (min) 52 min   Activity Tolerance Patient tolerated treatment well      Past Medical History  Diagnosis Date  . Allergy   . HX: breast cancer   . Hypertension   . Basal cell carcinoma   . Osteopenia   . Hyperlipidemia     Past Surgical History  Procedure Laterality Date  . Cholecystectomy  1993  . Abdominal hysterectomy  1983  . Breast surgery      mastectomy  . Vein surgery    . Colonoscopy      There were no vitals filed for this visit.  Visit Diagnosis:  Bilateral low back pain with left-sided sciatica  Difficulty walking      Subjective Assessment - 07/12/14 1401    Subjective I am getting better, less pain.   Currently in Pain? Yes   Pain Score 5    Pain Location Back   Pain Orientation Lower   Pain Descriptors / Indicators Aching   Aggravating Factors  sometimes I will just get in a position that hurts   Pain Relieving Factors the treatment helps                         [facility] Adult PT Treatment/Exercise - 07/12/14 0001    Lumbar Exercises: Aerobic   Stationary Bike Nustep L4 5 min   Lumbar Exercises: Machines for Strengthening   Cybex Lumbar Extension red tband 15 times standing   Other Lumbar Machine Exercise sit fit pelvic ROM, then stability with arms and legs alternating kicks   Lumbar Exercises: Supine   Ab Set 15 reps;3 seconds   AB Set Limitations isometric abdominals with ball   Bridge 15 reps   Other Supine Lumbar Exercises feet on  ball side to side   Moist Heat Therapy   Number Minutes Moist Heat 15 Minutes   Moist Heat Location Lumbar Spine   Electrical Stimulation   Electrical Stimulation Location lumbar spine   Electrical Stimulation Parameters IFC   Electrical Stimulation Goals Pain                  PT Short Term Goals - 06/22/14 1607    PT SHORT TERM GOAL #1   Title independent with initial HEP   Time 2   Period Weeks   Status New           PT Long Term Goals - 07/12/14 1434    PT LONG TERM GOAL #1   Title understand and demonstrate proper posture and body mechanics   Status On-going   PT LONG TERM GOAL #2   Title increase lumbar ROM 25%   Status On-going               Plan - 07/12/14 1427    Clinical Impression Statement Patient with reports of less pain and better motions, reports some less pain with activity at the house   PT  Next Visit Plan progress ther ex and continue with PROM   Consulted and Agree with Plan of Care Patient        Problem List Patient Active Problem List   Diagnosis Date Noted  . Lumbar degenerative disc disease 05/23/2014  . Arthritis of left hip 05/23/2014  . Greater trochanteric bursitis of right hip 05/23/2014  . Renal artery stenosis 12/15/2013  . Hyperlipidemia 09/10/2006  . Essential hypertension 08/18/2006  . Allergic rhinitis 08/18/2006  . Osteopenia 08/18/2006  . BREAST CANCER, HX OF 08/18/2006    Sumner Boast., PT 07/12/2014, 2:37 PM  [address] [place], [place], [address] Phone: [phone]   Fax:  [phone]

## 2014-07-19 ENCOUNTER — Ambulatory Visit: Payer: Medicare Other | Admitting: Family Medicine

## 2014-07-20 ENCOUNTER — Encounter: Payer: Self-pay | Admitting: Physical Therapy

## 2014-07-20 ENCOUNTER — Ambulatory Visit: Payer: Medicare Other | Admitting: Physical Therapy

## 2014-07-20 DIAGNOSIS — M5442 Lumbago with sciatica, left side: Secondary | ICD-10-CM

## 2014-07-20 DIAGNOSIS — R262 Difficulty in walking, not elsewhere classified: Secondary | ICD-10-CM | POA: Diagnosis not present

## 2014-07-20 NOTE — Therapy (Signed)
[place] [address] [place], [place], [address] Phone: [phone]   Fax:  [phone]  Physical Therapy Treatment  Patient Details  Name: Jane Cox MRN: [id number] Date of Birth: [birth year] Referring Provider:  Lyndal Pulley, DO  Encounter Date: 07/20/2014      PT End of Session - 07/20/14 1525    Visit Number 4   PT Start Time 6314   PT Stop Time 1445   PT Time Calculation (min) 49 min   Activity Tolerance Patient tolerated treatment well      Past Medical History  Diagnosis Date  . Allergy   . HX: breast cancer   . Hypertension   . Basal cell carcinoma   . Osteopenia   . Hyperlipidemia     Past Surgical History  Procedure Laterality Date  . Cholecystectomy  1993  . Abdominal hysterectomy  1983  . Breast surgery      mastectomy  . Vein surgery    . Colonoscopy      There were no vitals filed for this visit.  Visit Diagnosis:  Bilateral low back pain with left-sided sciatica  Difficulty walking      Subjective Assessment - 07/20/14 1358    Subjective Bending over still really causes the low back pain.   Currently in Pain? Yes   Pain Score 3    Pain Location Back   Pain Orientation Lower   Pain Descriptors / Indicators Aching   Pain Type Chronic pain   Aggravating Factors  sitting for a long time, any bending, house cleaning   Pain Relieving Factors the treatment helps for quite a while                         [facility] Adult PT Treatment/Exercise - 07/20/14 0001    Lumbar Exercises: Stretches   Passive Hamstring Stretch 3 reps;30 seconds   Lumbar Exercises: Aerobic   Stationary Bike Nustep L4 6 min   Moist Heat Therapy   Number Minutes Moist Heat 15 Minutes   Moist Heat Location Lumbar Spine   Electrical Stimulation   Electrical Stimulation Location lumbar spine   Electrical Stimulation Parameters IFC   Electrical Stimulation Goals Pain                 PT Education - 07/20/14 1524    Education provided Yes   Education Details all posture and body mechanics for ADL's and housework   Person(s) Educated Patient   Methods Explanation;Demonstration   Comprehension Verbalized understanding;Returned demonstration          PT Short Term Goals - 06/22/14 1607    PT SHORT TERM GOAL #1   Title independent with initial HEP   Time 2   Period Weeks   Status New           PT Long Term Goals - 07/12/14 1434    PT LONG TERM GOAL #1   Title understand and demonstrate proper posture and body mechanics   Status On-going   PT LONG TERM GOAL #2   Title increase lumbar ROM 25%   Status On-going               Plan - 07/20/14 1526    Clinical Impression Statement Assure understanding of body mechanics, she may bring in TENS for education   PT Next Visit Plan if she brings in TENS we will assure her safety  Consulted and Agree with Plan of Care Patient        Problem List Patient Active Problem List   Diagnosis Date Noted  . Lumbar degenerative disc disease 05/23/2014  . Arthritis of left hip 05/23/2014  . Greater trochanteric bursitis of right hip 05/23/2014  . Renal artery stenosis 12/15/2013  . Hyperlipidemia 09/10/2006  . Essential hypertension 08/18/2006  . Allergic rhinitis 08/18/2006  . Osteopenia 08/18/2006  . BREAST CANCER, HX OF 08/18/2006    Sumner Boast., PT 07/20/2014, 3:27 PM  [place] [address] [place], [place], [address] Phone: [phone]   Fax:  [phone]

## 2014-07-20 NOTE — Patient Instructions (Signed)
BODY MECHANICS Tips Good body mechanics are important during activities of daily living. The practice of good body mechanics will: -help distribute weight throughout the skeleton in a more anatomically correct manner thus stimulating more normal forces on the bones, and encouraging stronger, healthier, denser bones. -reduce unnatural forces on bones, ligaments, joints and muscles and reduce risk of fracture, other injury or back pain. A WORD ON BODY POSITIONING: Sitting is the hardest position for the back. Lying on the back is the easiest. Standing, in good body alignment, is somewhere between. A good motto is: Sit less, stand more, and, when you can't do that, lie down on your back and exercise to strengthen it.  Copyright  VHI. All rights reserved.

## 2014-07-27 ENCOUNTER — Encounter: Payer: Self-pay | Admitting: Family Medicine

## 2014-07-27 ENCOUNTER — Ambulatory Visit (INDEPENDENT_AMBULATORY_CARE_PROVIDER_SITE_OTHER): Payer: Medicare Other | Admitting: Family Medicine

## 2014-07-27 VITALS — BP 124/70 | HR 72 | Ht 63.0 in | Wt 148.0 lb

## 2014-07-27 DIAGNOSIS — M5136 Other intervertebral disc degeneration, lumbar region: Secondary | ICD-10-CM | POA: Diagnosis not present

## 2014-07-27 DIAGNOSIS — I10 Essential (primary) hypertension: Secondary | ICD-10-CM

## 2014-07-27 NOTE — Patient Instructions (Signed)
I am so happy you are doing better! Keep doing what you are dong Conitnue the vitamins TENS unit is a good idea I think PT will let you loose soon.  ICe when you need it See me when you need me.  Will send note to Dr. Yong Channel about the amlodipine.

## 2014-07-27 NOTE — Assessment & Plan Note (Signed)
Patient does have the underlying osteophytic changes that are likely contributing to some discomfort and she will continue to have flares intermittently. Patient is doing so well though that I think she will continue to do well. Discussed icing regimen and home exercises. Patient will continue the natural supplementations. Patient will come back and see me on an as-needed basis.

## 2014-07-27 NOTE — Assessment & Plan Note (Signed)
Patient's blood pressure were normal today. Patient would like to avoid increasing her amlodipine and she is concerned primary care doctor may make some changes. Patient will talk to him in the next 2 weeks but may be other medications can be changed instead of this medicine.

## 2014-07-27 NOTE — Progress Notes (Signed)
Pre visit review using our clinic review tool, if applicable. No additional management support is needed unless otherwise documented below in the visit note. 

## 2014-07-27 NOTE — Progress Notes (Signed)
[facility] [address], [place] [address] Phone: [phone] Subjective:    CC: low back pain bilaterally TDV:[id number] Jane Cox is a 81 y.o. female coming in with complaint of low back pain bilaterally as well as hip pain. Patient was found to have moderate to severe osteophytic changes of the lumbar spine as well as the hips. Patient elected try formal physical therapy and conservative therapy including icing and over-the-counter natural supplementations. Patient states she has now been to physical therapy 4 times.patient states that she is approximately 90% better. Patient states that she is doing much better and feels that physical therapy has been unbelievable. Patient denies any radiation down the legs and overall can do all daily activities without any significant discomfort. Has some mild discomfort at all times but not stopping her from activity.   Past Medical History  Diagnosis Date  . Allergy   . HX: breast cancer   . Hypertension   . Basal cell carcinoma   . Osteopenia   . Hyperlipidemia    Past Surgical History  Procedure Laterality Date  . Cholecystectomy  1993  . Abdominal hysterectomy  1983  . Breast surgery      mastectomy  . Vein surgery    . Colonoscopy     Allergies  Allergen Reactions  . Bacitracin-Polymyxin B     REACTION: rash  . Neosporin [Neomycin-Polymyxin-Gramicidin]    Family History  Problem Relation Age of Onset  . Lung cancer Father    History  Substance Use Topics  . Smoking status: Never Smoker   . Smokeless tobacco: Never Used  . Alcohol Use: 0.5 oz/week    1 drink(s) per week        Past medical history, social, surgical and family history all reviewed in electronic medical record.   Review of Systems: No headache, visual changes, nausea, vomiting, diarrhea, constipation, dizziness, abdominal pain, skin rash, fevers, chills, night sweats, weight loss, swollen lymph nodes, body  aches, joint swelling, muscle aches, chest pain, shortness of breath, mood changes.   Objective Blood pressure 124/70, pulse 72, height 5\' 3"  (1.6 m), weight 148 lb (67.132 kg), SpO2 97 %.  General: No apparent distress alert and oriented x3 mood and affect normal, dressed appropriately.  HEENT: Pupils equal, extraocular movements intact  Respiratory: Patient's speak in full sentences and does not appear short of breath  Cardiovascular: No lower extremity edema, non tender, no erythema  Skin: Warm dry intact with no signs of infection or rash on extremities or on axial skeleton.  Abdomen: Soft nontender  Neuro: Cranial nerves II through XII are intact, neurovascularly intact in all extremities with 2+ DTRs and 2+ pulses.  Lymph: No lymphadenopathy of posterior or anterior cervical chain or axillae bilaterally.  Gait normal with good balance and coordination.  MSK:  Non tender with full range of motion and good stability and symmetric strength and tone of shoulders, elbows, wrist, hip, knee and ankles bilaterally. Mild to moderate osteophytic changes of multiple joints Back Exam:  Inspection: increasing kyphosis and decrease in lordosis of the lumbar spine Motion: Flexion 35 deg, Extension 35 deg, Side Bending to 30 deg bilaterally,  Rotation to 30 deg bilaterally  SLR laying: Negative  XSLR laying: Negative  Palpable tenderness:mild tenderness today FABER: including tightness but decrease pain bilaterally Sensory change: Gross sensation intact to all lumbar and sacral dermatomes.  Reflexes: 2+ at both patellar tendons, 2+ at achilles tendons, Babinski's downgoing.  Strength  at foot  Plantar-flexion: 5/5 Dorsi-flexion: 5/5 Eversion: 5/5 Inversion: 5/5  Leg strength  Quad: 5/5 Hamstring: 5/5 Hip flexor: 5/5 Hip abductors: 4/5 symmetric Gait unremarkable.     Impression and Recommendations:     This case required medical decision making of moderate complexity.

## 2014-07-28 ENCOUNTER — Ambulatory Visit: Payer: Medicare Other | Admitting: Physical Therapy

## 2014-07-28 DIAGNOSIS — M5442 Lumbago with sciatica, left side: Secondary | ICD-10-CM

## 2014-07-28 DIAGNOSIS — R262 Difficulty in walking, not elsewhere classified: Secondary | ICD-10-CM | POA: Diagnosis not present

## 2014-07-28 NOTE — Therapy (Signed)
[facility] [place], [place], [address] Phone: [phone]   Fax:  [phone]  Physical Therapy Treatment  Patient Details  Name: Jane Cox MRN: [id number] Date of Birth: [birth year] Referring Provider:  Lyndal Pulley, DO  Encounter Date: 07/28/2014      PT End of Session - 07/28/14 1430    Visit Number 5   Date for PT Re-Evaluation 08/22/14   PT Start Time 1400   PT Stop Time 1430   PT Time Calculation (min) 30 min      Past Medical History  Diagnosis Date  . Allergy   . HX: breast cancer   . Hypertension   . Basal cell carcinoma   . Osteopenia   . Hyperlipidemia     Past Surgical History  Procedure Laterality Date  . Cholecystectomy  1993  . Abdominal hysterectomy  1983  . Breast surgery      mastectomy  . Vein surgery    . Colonoscopy      There were no vitals filed for this visit.  Visit Diagnosis:  Bilateral low back pain with left-sided sciatica      Subjective Assessment - 07/28/14 1428    Subjective BM and education have helped so much, just need educated on TENS then discharge. Best week in [place].   Currently in Pain? No/denies                         [facility] Adult PT Treatment/Exercise - 07/28/14 0001    Modalities   Modalities Electrical Stimulation  attended TENS instruction                PT Education - 07/28/14 1430    Education provided Yes   Education Details TENS education use, safety,application   Person(s) Educated Patient;Child(ren)   Methods Demonstration;Explanation   Comprehension Verbalized understanding;Returned demonstration          PT Short Term Goals - 07/28/14 1431    PT SHORT TERM GOAL #1   Title independent with initial HEP   Status Achieved           PT Long Term Goals - 07/28/14 1431    PT LONG TERM GOAL #1   Title understand and demonstrate proper posture and body mechanics   Status Achieved    PT LONG TERM GOAL #2   Title increase lumbar ROM 25%   Status Achieved   PT LONG TERM GOAL #3   Title decrease pain 50%   Status Achieved   PT LONG TERM GOAL #4   Title walk 20 minutes without difficulty   Status Achieved               Plan - 07/28/14 1430    Clinical Impression Statement educated in TENS- D/C   PT Next Visit Plan Discharge        Problem List Patient Active Problem List   Diagnosis Date Noted  . Lumbar degenerative disc disease 05/23/2014  . Arthritis of left hip 05/23/2014  . Greater trochanteric bursitis of right hip 05/23/2014  . Renal artery stenosis 12/15/2013  . Hyperlipidemia 09/10/2006  . Essential hypertension 08/18/2006  . Allergic rhinitis 08/18/2006  . Osteopenia 08/18/2006  . BREAST CANCER, HX OF 08/18/2006    Dakwan Pridgen,ANGIE PTA [facility] PT 07/28/2014, 2:33 PM  [facility] [address] [place], [place], [address] Phone: [phone]  Fax:  [phone]

## 2014-08-04 ENCOUNTER — Ambulatory Visit (INDEPENDENT_AMBULATORY_CARE_PROVIDER_SITE_OTHER): Payer: Medicare Other | Admitting: Family Medicine

## 2014-08-04 ENCOUNTER — Encounter: Payer: Self-pay | Admitting: Family Medicine

## 2014-08-04 VITALS — BP 170/76 | HR 70 | Temp 98.3°F | Wt 149.0 lb

## 2014-08-04 DIAGNOSIS — I10 Essential (primary) hypertension: Secondary | ICD-10-CM

## 2014-08-04 DIAGNOSIS — E785 Hyperlipidemia, unspecified: Secondary | ICD-10-CM

## 2014-08-04 NOTE — Assessment & Plan Note (Signed)
Continue atorvastatin 10mg  as controlled. Patient will return fasting for AWV in 6 months and will update labs.

## 2014-08-04 NOTE — Patient Instructions (Signed)
No changes today  Blood pressure looks great at home and at Dr. Thompson Caul office.  This may be white coat hypertension isolated to this office.   Follow up in 6 months with a morning appointment and we will update bloodwork. If you would like, could make this your annual wellness visit.

## 2014-08-04 NOTE — Assessment & Plan Note (Signed)
Controlled on Amlodipine 5mg , methyldopa, labetalol, lisinopril 40mg  per home readings. Home cuff verified 2015 by [name]. Also controlled at Dr. Thompson Caul office. Given that, will continue current medications despite poor in office control.

## 2014-08-04 NOTE — Progress Notes (Signed)
Garret Reddish, MD  Subjective:  Jane Cox is a 81 y.o. year old very pleasant female patient who presents with:  Hypertension- excellent control at home and in offices outside our office, poor control today  BP Readings from Last 3 Encounters:  08/04/14 170/76  07/27/14 124/70  06/14/14 132/64   Home BP monitoring-brings 14 days of logs and all 140/88 or less. 2 140s were on days with some increased back pain.  Compliant with medications-yes without side effects except mild swelling due to amlodipine.  ROS-Denies any CP, HA, SOB, blurry vision, LE edema  Hyperlipidemia-controlled  Lab Results  Component Value Date   LDLCALC 51 04/12/2013   On statin: atorvastatin 10mg  Regular exercise: PT exercises, but nto walking as much due to back. Advised biking or water aerobics ROS- no chest pain or shortness of breath. No myalgias  Past Medical History- breast cancer survivor with mastectomy in 1980, renal artery stenosis, osteopenia  Medications- reviewed and updated Current Outpatient Prescriptions  Medication Sig Dispense Refill  . acetaminophen (TYLENOL) 650 MG CR tablet Take 650 mg by mouth 3 (three) times daily.    Marland Kitchen amLODipine (NORVASC) 5 MG tablet Take 1 tablet (5 mg total) by mouth daily. 90 tablet 3  . atorvastatin (LIPITOR) 20 MG tablet Take 1 tablet (20 mg total) by mouth daily at 6 PM. 90 tablet 1  . labetalol (NORMODYNE) 100 MG tablet Take 1 tablet (100 mg total) by mouth 2 (two) times daily. 180 tablet 2  . lisinopril (PRINIVIL,ZESTRIL) 40 MG tablet TAKE 1 TABLET (40 MG TOTAL) BY MOUTH DAILY. 90 tablet 3  . methyldopa (ALDOMET) 500 MG tablet TAKE 1 TABLET BY MOUTH TWICE A DAY 180 tablet 2  . Multiple Vitamin (MULTIVITAMIN) tablet Take 1 tablet by mouth daily.       Objective: BP 170/76 mmHg  Pulse 70  Temp(Src) 98.3 F (36.8 C)  Wt 149 lb (67.586 kg)  Repeat BPs all in 170-180 range with 2 separate cuffs tried Gen: NAD, resting comfortably CV: RRR no murmurs  rubs or gallops Lungs: CTAB no crackles, wheeze, rhonchi Abdomen: soft/nontender/nondistended/normal bowel sounds. No rebound or guarding.  Ext: trace edema Skin: warm, dry, no rash Neuro: grossly normal, moves all extremities   Assessment/Plan:  Essential hypertension Controlled on Amlodipine 5mg , methyldopa, labetalol, lisinopril 40mg  per home readings. Home cuff verified 2015 by [name]. Also controlled at Dr. Thompson Caul office. Given that, will continue current medications despite poor in office control.   Hyperlipidemia Continue atorvastatin 10mg  as controlled. Patient will return fasting for AWV in 6 months and will update labs.   6 months AWV and fasting labs at that time.   Meds ordered this encounter  Medications  . acetaminophen (TYLENOL) 650 MG CR tablet    Sig: Take 650 mg by mouth 3 (three) times daily.

## 2014-08-22 ENCOUNTER — Other Ambulatory Visit: Payer: Self-pay | Admitting: Internal Medicine

## 2014-09-19 ENCOUNTER — Other Ambulatory Visit: Payer: Self-pay | Admitting: Family Medicine

## 2014-10-06 DIAGNOSIS — L7211 Pilar cyst: Secondary | ICD-10-CM | POA: Diagnosis not present

## 2014-10-06 DIAGNOSIS — D3610 Benign neoplasm of peripheral nerves and autonomic nervous system, unspecified: Secondary | ICD-10-CM | POA: Diagnosis not present

## 2014-10-06 DIAGNOSIS — L821 Other seborrheic keratosis: Secondary | ICD-10-CM | POA: Diagnosis not present

## 2014-10-06 DIAGNOSIS — D1801 Hemangioma of skin and subcutaneous tissue: Secondary | ICD-10-CM | POA: Diagnosis not present

## 2014-10-06 DIAGNOSIS — L738 Other specified follicular disorders: Secondary | ICD-10-CM | POA: Diagnosis not present

## 2014-10-06 DIAGNOSIS — L570 Actinic keratosis: Secondary | ICD-10-CM | POA: Diagnosis not present

## 2014-10-06 DIAGNOSIS — D485 Neoplasm of uncertain behavior of skin: Secondary | ICD-10-CM | POA: Diagnosis not present

## 2014-10-06 DIAGNOSIS — L438 Other lichen planus: Secondary | ICD-10-CM | POA: Diagnosis not present

## 2014-10-06 DIAGNOSIS — D3612 Benign neoplasm of peripheral nerves and autonomic nervous system, upper limb, including shoulder: Secondary | ICD-10-CM | POA: Diagnosis not present

## 2014-10-06 DIAGNOSIS — L853 Xerosis cutis: Secondary | ICD-10-CM | POA: Diagnosis not present

## 2014-10-06 DIAGNOSIS — Z85828 Personal history of other malignant neoplasm of skin: Secondary | ICD-10-CM | POA: Diagnosis not present

## 2014-10-27 ENCOUNTER — Encounter: Payer: Self-pay | Admitting: Family Medicine

## 2014-10-27 DIAGNOSIS — Z23 Encounter for immunization: Secondary | ICD-10-CM | POA: Diagnosis not present

## 2014-11-01 ENCOUNTER — Encounter: Payer: Self-pay | Admitting: Gastroenterology

## 2014-11-03 ENCOUNTER — Other Ambulatory Visit: Payer: Self-pay | Admitting: Family Medicine

## 2014-11-15 DIAGNOSIS — H25013 Cortical age-related cataract, bilateral: Secondary | ICD-10-CM | POA: Diagnosis not present

## 2014-11-15 DIAGNOSIS — H2513 Age-related nuclear cataract, bilateral: Secondary | ICD-10-CM | POA: Diagnosis not present

## 2014-11-15 DIAGNOSIS — H02839 Dermatochalasis of unspecified eye, unspecified eyelid: Secondary | ICD-10-CM | POA: Diagnosis not present

## 2014-11-15 DIAGNOSIS — H18413 Arcus senilis, bilateral: Secondary | ICD-10-CM | POA: Diagnosis not present

## 2015-01-16 ENCOUNTER — Other Ambulatory Visit: Payer: Self-pay | Admitting: Family Medicine

## 2015-02-07 ENCOUNTER — Encounter: Payer: Medicare Other | Admitting: Family Medicine

## 2015-02-27 ENCOUNTER — Ambulatory Visit (INDEPENDENT_AMBULATORY_CARE_PROVIDER_SITE_OTHER): Payer: Medicare Other | Admitting: Family Medicine

## 2015-02-27 ENCOUNTER — Encounter: Payer: Self-pay | Admitting: Family Medicine

## 2015-02-27 VITALS — BP 150/70 | HR 69 | Temp 98.4°F | Wt 144.0 lb

## 2015-02-27 DIAGNOSIS — J329 Chronic sinusitis, unspecified: Secondary | ICD-10-CM | POA: Diagnosis not present

## 2015-02-27 DIAGNOSIS — I10 Essential (primary) hypertension: Secondary | ICD-10-CM | POA: Diagnosis not present

## 2015-02-27 DIAGNOSIS — B9689 Other specified bacterial agents as the cause of diseases classified elsewhere: Secondary | ICD-10-CM

## 2015-02-27 DIAGNOSIS — A499 Bacterial infection, unspecified: Secondary | ICD-10-CM

## 2015-02-27 MED ORDER — AMOXICILLIN-POT CLAVULANATE 875-125 MG PO TABS: 1.0000 | ORAL_TABLET | Freq: Two times a day (BID) | ORAL | Status: DC

## 2015-02-27 NOTE — Patient Instructions (Signed)
Sinsusitis Bacterial based on: Symptoms >10 days  Treatment: -other symptomatic care with plain mucinex if desired, continue nasal saline, tylenol for sinus pressure -Antibiotic indicated: yes. Patient should take the full course of medicine.   Finally, we reviewed reasons to return to care including if symptoms worsen or persist or new concerns arise.  Meds ordered this encounter  Medications  . amoxicillin-clavulanate (AUGMENTIN) 875-125 MG tablet    Sig: Take 1 tablet by mouth 2 (two) times daily.    Dispense:  14 tablet    Refill:  0

## 2015-02-27 NOTE — Progress Notes (Addendum)
PCP: Garret Reddish, MD  Subjective:  Jane Cox is a 82 y.o. year old very pleasant female patient who presents with sinusitis symptoms including nasal congestion with green/yellow discharge, sinus tenderness -other symptoms include: some sore throat. Productive cough with yellow sputum.  -day of illness:>21 -started: over 3 weeks ago -Symptoms show no change -previous treatments: nasal spray, tylenol for sinus pressure -sick contacts/travel/risks: denies flu exposure.  -Hx of: allergies  ROS-denies fever, SOB, NVD, tooth pain  Pertinent Past Medical History-  Patient Active Problem List   Diagnosis Date Noted  . Renal artery stenosis ([place]) 12/15/2013    Priority: Medium  . Hyperlipidemia 09/10/2006    Priority: Medium  . Essential hypertension 08/18/2006    Priority: Medium  . BREAST CANCER, HX OF 08/18/2006    Priority: Medium  . Allergic rhinitis 08/18/2006    Priority: Low  . Osteopenia 08/18/2006    Priority: Low  . Lumbar degenerative disc disease 05/23/2014  . Arthritis of left hip 05/23/2014  . Greater trochanteric bursitis of right hip 05/23/2014    Medications- reviewed  Current Outpatient Prescriptions  Medication Sig Dispense Refill  . amLODipine (NORVASC) 5 MG tablet TAKE 1 TABLET BY MOUTH ONCE DAILY 90 tablet 3  . atorvastatin (LIPITOR) 20 MG tablet TAKE 1 TABLET BY MOUTH DAILY AT 6PM 90 tablet 2  . labetalol (NORMODYNE) 100 MG tablet TAKE 1 TABLET BY MOUTH TWICE A DAY 180 tablet 3  . lisinopril (PRINIVIL,ZESTRIL) 40 MG tablet TAKE 1 TABLET (40 MG TOTAL) BY MOUTH DAILY. 90 tablet 3  . methyldopa (ALDOMET) 500 MG tablet TAKE 1 TABLET BY MOUTH TWICE A DAY 180 tablet 3  . Multiple Vitamin (MULTIVITAMIN) tablet Take 1 tablet by mouth daily.      Marland Kitchen acetaminophen (TYLENOL) 650 MG CR tablet Take 650 mg by mouth 2 (two) times daily. Reported on 02/27/2015     Objective: BP 150/70 mmHg  Pulse 69  Temp(Src) 98.4 F (36.9 C)  Wt 144 lb (65.318 kg) Gen: NAD,  resting comfortably HEENT: Turbinates erythematous with yellow drainage, TM normal, pharynx mildly erythematous with no tonsilar exudate or edema, maxillary sinus tenderness CV: RRR 2/6 SEM not previously noted- will monitor. no murmurs rubs or gallops Lungs: CTAB no crackles, wheeze, rhonchi Abdomen: soft/nontender/nondistended/normal bowel sounds. No rebound or guarding.  Ext: no edema Skin: warm, dry, no rash Neuro: grossly normal, moves all extremities  Assessment/Plan:  Sinsusitis (new acute episode) Bacterial based on: Symptoms >10 days  Treatment: -other symptomatic care with plain mucinex if desired, continue nasal saline, tylenol for sinus pressure -Antibiotic indicated: yes. Patient should take the full course of medicine.   Finally, we reviewed reasons to return to care including if symptoms worsen or persist or new concerns arise.  Meds ordered this encounter  Medications  . amoxicillin-clavulanate (AUGMENTIN) 875-125 MG tablet    Sig: Take 1 tablet by mouth 2 (two) times daily.    Dispense:  14 tablet    Refill:  0    Hypertension-  S:controlled on home readings. Poor control in office. A/P: Avoided steroids with elevated BP here. In addition advised no decongestants. Continue current medication

## 2015-04-20 ENCOUNTER — Ambulatory Visit (INDEPENDENT_AMBULATORY_CARE_PROVIDER_SITE_OTHER): Payer: Medicare Other | Admitting: Family Medicine

## 2015-04-20 ENCOUNTER — Encounter: Payer: Self-pay | Admitting: Family Medicine

## 2015-04-20 VITALS — BP 160/80 | HR 61 | Temp 97.7°F | Resp 18 | Ht 61.0 in | Wt 147.0 lb

## 2015-04-20 DIAGNOSIS — M8580 Other specified disorders of bone density and structure, unspecified site: Secondary | ICD-10-CM | POA: Diagnosis not present

## 2015-04-20 DIAGNOSIS — Z0000 Encounter for general adult medical examination without abnormal findings: Secondary | ICD-10-CM | POA: Diagnosis not present

## 2015-04-20 DIAGNOSIS — Z780 Asymptomatic menopausal state: Secondary | ICD-10-CM

## 2015-04-20 DIAGNOSIS — I701 Atherosclerosis of renal artery: Secondary | ICD-10-CM

## 2015-04-20 DIAGNOSIS — E785 Hyperlipidemia, unspecified: Secondary | ICD-10-CM | POA: Diagnosis not present

## 2015-04-20 DIAGNOSIS — I10 Essential (primary) hypertension: Secondary | ICD-10-CM | POA: Diagnosis not present

## 2015-04-20 DIAGNOSIS — Z853 Personal history of malignant neoplasm of breast: Secondary | ICD-10-CM

## 2015-04-20 DIAGNOSIS — C4491 Basal cell carcinoma of skin, unspecified: Secondary | ICD-10-CM | POA: Insufficient documentation

## 2015-04-20 DIAGNOSIS — Z0001 Encounter for general adult medical examination with abnormal findings: Secondary | ICD-10-CM

## 2015-04-20 LAB — CBC
HEMATOCRIT: 35.9 % — AB (ref 36.0–46.0)
Hemoglobin: 12 g/dL (ref 12.0–15.0)
MCHC: 33.4 g/dL (ref 30.0–36.0)
MCV: 87 fl (ref 78.0–100.0)
PLATELETS: 252 10*3/uL (ref 150.0–400.0)
RBC: 4.13 Mil/uL (ref 3.87–5.11)
RDW: 14.6 % (ref 11.5–15.5)
WBC: 7.8 10*3/uL (ref 4.0–10.5)

## 2015-04-20 LAB — LIPID PANEL
CHOLESTEROL: 151 mg/dL (ref 0–200)
HDL: 48.9 mg/dL (ref >39.00)
NonHDL: 101.95
Total CHOL/HDL Ratio: 3
Triglycerides: 205 mg/dL — ABNORMAL HIGH (ref 0.0–149.0)
VLDL: 41 mg/dL — ABNORMAL HIGH (ref 0.0–40.0)

## 2015-04-20 LAB — LDL CHOLESTEROL, DIRECT: LDL DIRECT: 76 mg/dL

## 2015-04-20 LAB — COMPREHENSIVE METABOLIC PANEL
ALBUMIN: 4.5 g/dL (ref 3.5–5.2)
ALK PHOS: 85 U/L (ref 39–117)
ALT: 18 U/L (ref 0–35)
AST: 15 U/L (ref 0–37)
BUN: 11 mg/dL (ref 6–23)
CO2: 27 mEq/L (ref 19–32)
CREATININE: 0.66 mg/dL (ref 0.40–1.20)
Calcium: 9.9 mg/dL (ref 8.4–10.5)
Chloride: 105 mEq/L (ref 96–112)
GFR: 91.65 mL/min (ref >60.00)
Glucose, Bld: 87 mg/dL (ref 70–99)
Potassium: 3.8 mEq/L (ref 3.5–5.1)
Sodium: 141 mEq/L (ref 135–145)
TOTAL PROTEIN: 7.2 g/dL (ref 6.0–8.3)
Total Bilirubin: 0.9 mg/dL (ref 0.2–1.2)

## 2015-04-20 LAB — TSH: TSH: 2.91 u[IU]/mL (ref 0.35–4.50)

## 2015-04-20 NOTE — Assessment & Plan Note (Signed)
S: controlled at home around 130/70- have verified home cuff in the past. On amlodipine 5mg , labetalol 100mg  BID, lisinopril 40mg , methyldopa 500mg  BID BP Readings from Last 3 Encounters:  04/20/15 160/80  02/27/15 150/70  08/04/14 170/76  A/P:Continue current meds:  Home #s look great and that cuff has been verified. Make sure renal artery stenosis not contributing

## 2015-04-20 NOTE — Assessment & Plan Note (Signed)
Breast cancer history- diagnosed by GYN in 1980, mastectomy on right. Mammogram 04/2014 normal- repeats yearly.

## 2015-04-20 NOTE — Patient Instructions (Addendum)
  [name] , Thank you for taking time to come for your Medicare Wellness Visit. I appreciate your ongoing commitment to your health goals. Please review the following plan we discussed and let me know if I can assist you in the future.  _______________________________________________________  These are the goals we discussed: 1. Fasting labs before you leave 2. We will call you within a week about your referral to cardiology for follow up of renal artery stenosis. If you do not hear within 2 weeks, give [place] a call. May take a few months to get in 3. Schedule your bone density test at check out desk 4. Get mammogram scheduled due to history of breast cancer 5. Consider increasing exercise to 5 days a week at 30 minutes 6. Advise you to see an audiologist- we can enter a referral if you need. Seeing ENT is fine also as they have audiologist ________________________________________________________   This is a list of the screening recommended for you and due dates:  Health Maintenance  Topic Date Due  . Mammogram  05/02/2015  . Flu Shot  08/29/2015  . Tetanus Vaccine  05/11/2023  . DEXA scan (bone density measurement)  Completed  . Shingles Vaccine  Completed  . Pneumonia vaccines  Completed

## 2015-04-20 NOTE — Assessment & Plan Note (Signed)
S: angioplasty in past on right renal artery in 1996. Had been concerned this issue could have been making BP difficult to control.  A/P: we will refer to cardiology for consideration of repeat imaging/catheterization

## 2015-04-20 NOTE — Progress Notes (Signed)
Garret Reddish, MD Phone: [phone]  Subjective:  Patient presents today for their annual wellness visit.    Preventive Screening-Counseling & Management  Smoking Status: Never Smoker Second Hand Smoking status: No smokers in home  Risk Factors Regular exercise: once a week walking Diet: reasonable Wt Readings from Last 3 Encounters:  04/20/15 147 lb (66.679 kg)  02/27/15 144 lb (65.318 kg)  08/04/14 149 lb (67.586 kg)  Fall Risk: None   Cardiac risk factors:  advanced age (older than 47 for men, 100 for women)  Hyperlipidemia yes but treated Hypertension- poor control No diabetes.    Depression Screen None. PHQ2 0   Activities of Daily Living Independent ADLs and IADLs   Hearing Difficulties: -patient endorses- Has an audiologist in mind- she will go visit  Cognitive Testing No reported trouble.   Normal 3 word recall   List the Names of Other Physician/Practitioners you currently use: -Dr. Tamala Julian sprots medicine -Dr. Talbert Forest optho -Dr. Derrel Nip dermatology  Immunization History  Administered Date(s) Administered  . Influenza Whole 10/28/2004, 11/10/2006, 10/28/2009, 09/29/2011  . Influenza, High Dose Seasonal PF 10/27/2014  . Influenza-Unspecified 10/28/2012, 11/04/2013, 10/27/2014  . Pneumococcal Conjugate-13 06/03/2013  . Pneumococcal Polysaccharide-23 01/29/2000, 04/26/2010  . Td 01/29/2003, 05/10/2013  . Zoster 01/28/2005   Required Immunizations needed today none  Screening tests- will get mammogram this year  ROS- No pertinent positives discovered in course of AWV Problem oriented-No chest pain or shortness of breath. No headache or blurry vision.    The following were reviewed and entered/updated in epic: Past Medical History  Diagnosis Date  . Allergy   . HX: breast cancer   . Hypertension   . Basal cell carcinoma   . Osteopenia   . Hyperlipidemia    Patient Active Problem List   Diagnosis Date Noted  . Renal artery  stenosis ([place]) 12/15/2013    Priority: Medium  . Hyperlipidemia 09/10/2006    Priority: Medium  . Essential hypertension 08/18/2006    Priority: Medium  . BREAST CANCER, HX OF 08/18/2006    Priority: Medium  . Skin cancer, basal cell 04/20/2015    Priority: Low  . Lumbar degenerative disc disease 05/23/2014    Priority: Low  . Arthritis of left hip 05/23/2014    Priority: Low  . Greater trochanteric bursitis of right hip 05/23/2014    Priority: Low  . Allergic rhinitis 08/18/2006    Priority: Low  . Osteopenia 08/18/2006    Priority: Low   Past Surgical History  Procedure Laterality Date  . Cholecystectomy  1993  . Abdominal hysterectomy  1983  . Breast surgery      mastectomy  . Vein surgery    . Colonoscopy      Family History  Problem Relation Age of Onset  . Lung cancer Father     Medications- reviewed and updated Current Outpatient Prescriptions  Medication Sig Dispense Refill  . acetaminophen (TYLENOL) 650 MG CR tablet Take 650 mg by mouth 2 (two) times daily. Reported on 02/27/2015    . amLODipine (NORVASC) 5 MG tablet TAKE 1 TABLET BY MOUTH ONCE DAILY 90 tablet 3  . atorvastatin (LIPITOR) 20 MG tablet TAKE 1 TABLET BY MOUTH DAILY AT 6PM 90 tablet 2  . labetalol (NORMODYNE) 100 MG tablet TAKE 1 TABLET BY MOUTH TWICE A DAY 180 tablet 3  . lisinopril (PRINIVIL,ZESTRIL) 40 MG tablet TAKE 1 TABLET (40 MG TOTAL) BY MOUTH DAILY. 90 tablet 3  . methyldopa (ALDOMET) 500 MG tablet TAKE 1 TABLET  BY MOUTH TWICE A DAY 180 tablet 3  . Multiple Vitamin (MULTIVITAMIN) tablet Take 1 tablet by mouth daily.       Allergies-reviewed and updated Allergies  Allergen Reactions  . Bacitracin-Polymyxin B     REACTION: rash  . Neosporin [Neomycin-Polymyxin-Gramicidin]     Social History   Social History  . Marital Status: Married    Spouse Name: N/A  . Number of Children: N/A  . Years of Education: N/A   Social History Main Topics  . Smoking status: Never Smoker   .  Smokeless tobacco: Never Used  . Alcohol Use: 0.5 oz/week    1 drink(s) per week  . Drug Use: No  . Sexual Activity: Not Asked   Other Topics Concern  . None   Social History Narrative   Married. Husband ill ([facility] pt- prostate cancer 2009-radiation/seed implant with colitis-worsening. Colitis, CMV, bronchitis and flu in feb 2015-SNF afterwards and came home 1 month later with op PT)      1 son-bachelor (patient here). No grandchildren.       Hobbies: ladies clubs previously before being home with husband, avid reader, doesn't enjoy outdoors.       Advanced Directives: Husband HCPOA. Plans for resuscitation.     Objective: BP 160/80 mmHg  Pulse 61  Temp(Src) 97.7 F (36.5 C) (Oral)  Resp 18  Ht 5\' 1"  (1.549 m)  Wt 147 lb (66.679 kg)  BMI 27.79 kg/m2  SpO2 96% Gen: NAD, resting comfortably HEENT: Mucous membranes are moist. Oropharynx normal CV: RRR no murmurs rubs or gallops Lungs: CTAB no crackles, wheeze, rhonchi Abdomen: soft/nontender/nondistended/normal bowel sounds. No rebound or guarding.  Ext: no edema Skin: warm, dry, no rash Neuro: grossly normal, moves all extremities, PERRLA  Assessment/Plan:  AWV completed- discussed recommended screenings anddocumented any personalized health advice and referrals for preventive counseling. See AVS as well which was given to patient.   Renal artery stenosis S: angioplasty in past on right renal artery in 1996. Had been concerned this issue could have been making BP difficult to control.  A/P: we will refer to cardiology for consideration of repeat imaging/catheterization  Hyperlipidemia S: well controlled on atorvastatin 10mg  in past. No myalgias.  Lab Results  Component Value Date   CHOL 135 04/12/2013   HDL 59.10 04/12/2013   LDLCALC 51 04/12/2013   LDLDIRECT 73.5 06/03/2012   TRIG 127.0 04/12/2013   CHOLHDL 2 04/12/2013   A/P: will update lipids today- looked great in past  Essential hypertension S:  controlled at home around 130/70- have verified home cuff in the past. On amlodipine 5mg , labetalol 100mg  BID, lisinopril 40mg , methyldopa 500mg  BID BP Readings from Last 3 Encounters:  04/20/15 160/80  02/27/15 150/70  08/04/14 170/76  A/P:Continue current meds:  Home #s look great and that cuff has been verified. Make sure renal artery stenosis not contributing  Osteopenia Takes MV with calcium/vit D. Last bone density 2013 with worst t score -1.6. Repeat at this time.   BREAST CANCER, HX OF Breast cancer history- diagnosed by GYN in 1980, mastectomy on right. Mammogram 04/2014 normal- repeats yearly.    6 months verbal. Return precautions advised.   Orders Placed This Encounter  Procedures  . DG Bone Density    Standing Status: Future     Number of Occurrences:      Standing Expiration Date: 06/19/2016    Order Specific Question:  Reason for Exam (SYMPTOM  OR DIAGNOSIS REQUIRED)    Answer:  osteopenia follow  up    Order Specific Question:  Preferred imaging location?    Answer:  [facility]  . CBC    [facility]  . Comprehensive metabolic panel    [facility]    Order Specific Question:  Has the patient fasted?    Answer:  No  . Lipid panel    [facility]    Order Specific Question:  Has the patient fasted?    Answer:  No  . TSH    [facility]  . Ambulatory referral to Cardiology    Referral Priority:  Routine    Referral Type:  Consultation    Referral Reason:  Specialty Services Required    Requested Specialty:  Cardiology    Number of Visits Requested:  1

## 2015-04-20 NOTE — Assessment & Plan Note (Signed)
S: well controlled on atorvastatin 10mg  in past. No myalgias.  Lab Results  Component Value Date   CHOL 135 04/12/2013   HDL 59.10 04/12/2013   LDLCALC 51 04/12/2013   LDLDIRECT 73.5 06/03/2012   TRIG 127.0 04/12/2013   CHOLHDL 2 04/12/2013   A/P: will update lipids today- looked great in past

## 2015-04-20 NOTE — Assessment & Plan Note (Signed)
Takes MV with calcium/vit D. Last bone density 2013 with worst t score -1.6. Repeat at this time.

## 2015-05-10 ENCOUNTER — Ambulatory Visit (INDEPENDENT_AMBULATORY_CARE_PROVIDER_SITE_OTHER)
Admission: RE | Admit: 2015-05-10 | Discharge: 2015-05-10 | Disposition: A | Discharge status: Home / Self Care | Payer: Medicare Other | Source: Ambulatory Visit | Attending: Family Medicine | Admitting: Family Medicine

## 2015-05-10 DIAGNOSIS — M8580 Other specified disorders of bone density and structure, unspecified site: Secondary | ICD-10-CM | POA: Diagnosis not present

## 2015-05-10 DIAGNOSIS — Z780 Asymptomatic menopausal state: Secondary | ICD-10-CM | POA: Diagnosis not present

## 2015-05-16 ENCOUNTER — Ambulatory Visit: Payer: Medicare Other | Admitting: Cardiology

## 2015-05-16 DIAGNOSIS — Z853 Personal history of malignant neoplasm of breast: Secondary | ICD-10-CM | POA: Diagnosis not present

## 2015-05-16 DIAGNOSIS — Z1231 Encounter for screening mammogram for malignant neoplasm of breast: Secondary | ICD-10-CM | POA: Diagnosis not present

## 2015-05-16 LAB — HM MAMMOGRAPHY: HM MAMMO: NORMAL (ref 0–4)

## 2015-05-18 ENCOUNTER — Encounter: Payer: Self-pay | Admitting: Family Medicine

## 2015-05-23 NOTE — Progress Notes (Signed)
Patient ID: Jane Cox, female   DOB: August 19, 1933, 82 y.o.   MRN: [id number]     Cardiology Office Note   Date:  05/30/2015   ID:  TRENEKA TOLEFREE, DOB [birth year], MRN [id number]  PCP:  Garret Reddish, MD  Cardiologist:   Jenkins Rouge, MD   Chief Complaint  Patient presents with  . Establish Care    ? renal artery stenosis      History of Present Illness: Jane Cox is a 82 y.o. female who presents for evaluation of HTN and RAS. No recent imaging studies to review Old notes Dr Leanne Chang indicated angioplasty of renal artery around 2000 BP in general has been well controlled on 4 drug Rx.  Some sodium in diet. Sedentary.  CRF also include elevated lipids on statin No history of renal failure  Patient is from [facility].  Seen by Dr Toniann Ket [phone] with diagnosis RAS and angioplasty RRA with no stent Saw Dr Lyndel Safe in 2002 with ? F/u duplex.  She has too much salt in her diet and is sedentary.  No secondary signs of uncontrolled HTN, normal renal function, no TIA/Stroke no LVH and vision ok    Lab Results  Component Value Date   CREATININE 0.66 04/20/2015   BUN 11 04/20/2015   NA 141 04/20/2015   K 3.8 04/20/2015   CL 105 04/20/2015   CO2 27 04/20/2015       Past Medical History  Diagnosis Date  . Allergy   . HX: breast cancer   . Hypertension   . Basal cell carcinoma   . Osteopenia   . Hyperlipidemia     Past Surgical History  Procedure Laterality Date  . Cholecystectomy  1993  . Abdominal hysterectomy  1983  . Breast surgery      mastectomy  . Vein surgery    . Colonoscopy       Current Outpatient Prescriptions  Medication Sig Dispense Refill  . acetaminophen (TYLENOL) 650 MG CR tablet Take 650 mg by mouth 2 (two) times daily. Reported on 02/27/2015    . amLODipine (NORVASC) 5 MG tablet TAKE 1 TABLET BY MOUTH ONCE DAILY 90 tablet 3  . atorvastatin (LIPITOR) 20 MG tablet TAKE 1 TABLET BY MOUTH DAILY AT 6PM 90 tablet 2  . labetalol (NORMODYNE)  100 MG tablet TAKE 1 TABLET BY MOUTH TWICE A DAY 180 tablet 3  . lisinopril (PRINIVIL,ZESTRIL) 40 MG tablet TAKE 1 TABLET (40 MG TOTAL) BY MOUTH DAILY. 90 tablet 3  . methyldopa (ALDOMET) 500 MG tablet TAKE 1 TABLET BY MOUTH TWICE A DAY 180 tablet 3  . Multiple Vitamin (MULTIVITAMIN) tablet Take 1 tablet by mouth daily.       No current facility-administered medications for this visit.    Allergies:   Bacitracin-polymyxin b and Neosporin    Social History:  The patient  reports that she has never smoked. She has never used smokeless tobacco. She reports that she drinks about 0.5 oz of alcohol per week. She reports that she does not use illicit drugs.   Family History:  The patient's family history includes Lung cancer in her father.    ROS:  Please see the history of present illness.   Otherwise, review of systems are positive for none.   All other systems are reviewed and negative.    PHYSICAL EXAM: VS:  There were no vitals taken for this visit. , BMI There is no weight on file to calculate BMI. Affect appropriate  Healthy:  appears stated age 70: normal Neck supple with no adenopathy JVP normal no bruits no thyromegaly Lungs clear with no wheezing and good diaphragmatic motion Heart:  S1/S2 no murmur, no rub, gallop or click PMI normal Abdomen: benighn, BS positve, no tenderness, no AAA no bruit.  No HSM or HJR Distal pulses intact with no bruits No edema Neuro non-focal Skin warm and dry No muscular weakness    EKG:   04/10/09  NSR no LVH normal ECG    Recent Labs: 04/20/2015: ALT 18; BUN 11; Creatinine, Ser 0.66; Hemoglobin 12.0; Platelets 252.0; Potassium 3.8; Sodium 141; TSH 2.91    Lipid Panel    Component Value Date/Time   CHOL 151 04/20/2015 0932   TRIG 205.0* 04/20/2015 0932   HDL 48.90 04/20/2015 0932   CHOLHDL 3 04/20/2015 0932   VLDL 41.0* 04/20/2015 0932   LDLCALC 51 04/12/2013 0945   LDLDIRECT 76.0 04/20/2015 0932      Wt Readings from Last 3  Encounters:  04/20/15 66.679 kg (147 lb)  02/27/15 65.318 kg (144 lb)  08/04/14 67.586 kg (149 lb)      Other studies Reviewed: Additional studies/ records that were reviewed today include: Notes Dr Leanne Chang and [place] labs Epic .    ASSESSMENT AND PLAN:  1.  HTN  Some white coat component home readings ok.  She will bring her BP cuff in for [place] to check. F/U 24 hr BP monitor and renal duplex. May need to add diuretic to BP regimen 2. RAS: Usually unilateral RAS does not cause issues I suspect she has long standing essential HTN with white coat Component f/u duplex  3. Elevated lipids  Labs with new primary  Cholesterol is at goal.  Continue current dose of statin and diet Rx.  No myalgias or side effects.  F/U  LFT's in 6 months. Lab Results  Component Value Date   LDLCALC 51 04/12/2013               Current medicines are reviewed at length with the patient today.  The patient does not have concerns regarding medicines.  The following changes have been made:  no change  Labs/ tests ordered today include: Renal Duplex and 24 hr BP monitor   No orders of the defined types were placed in this encounter.     Disposition:   FU with me next available after testing      Signed, Jenkins Rouge, MD  05/30/2015 4:20 PM    [facility] [place], [place], [place]  [address] Phone: [phone]; Fax: [phone]

## 2015-05-30 ENCOUNTER — Ambulatory Visit (INDEPENDENT_AMBULATORY_CARE_PROVIDER_SITE_OTHER): Payer: Medicare Other | Admitting: Cardiovascular Disease

## 2015-05-30 ENCOUNTER — Encounter: Payer: Self-pay | Admitting: Cardiovascular Disease

## 2015-05-30 VITALS — BP 180/70 | HR 65 | Resp 11

## 2015-05-30 DIAGNOSIS — I701 Atherosclerosis of renal artery: Secondary | ICD-10-CM

## 2015-05-30 DIAGNOSIS — I10 Essential (primary) hypertension: Secondary | ICD-10-CM

## 2015-05-30 DIAGNOSIS — Z7189 Other specified counseling: Secondary | ICD-10-CM | POA: Diagnosis not present

## 2015-05-30 DIAGNOSIS — Z7689 Persons encountering health services in other specified circumstances: Secondary | ICD-10-CM

## 2015-05-30 NOTE — Patient Instructions (Addendum)
Medication Instructions:  Your physician has recommended you make the following change in your medication:    Labwork: NONE  Testing/Procedures: Your physician has recommended that you wear an 24 hour BP event monitor. Event monitors are medical devices that record the blood pressure activity.  Your physician has requested that you have a renal artery duplex. During this test, an ultrasound is used to evaluate blood flow to the kidneys. Allow one hour for this exam. Do not eat after midnight the day before and avoid carbonated beverages. Take your medications as you usually do.  Follow-Up: Your physician wants you to follow-up next available after test complete with Dr. Johnsie Cancel.   If you need a refill on your cardiac medications before your next appointment, please call your pharmacy.

## 2015-06-09 ENCOUNTER — Ambulatory Visit (HOSPITAL_COMMUNITY)
Admission: RE | Admit: 2015-06-09 | Discharge: 2015-06-09 | Disposition: A | Discharge status: Home / Self Care | Payer: Medicare Other | Source: Ambulatory Visit | Attending: Internal Medicine | Admitting: Internal Medicine

## 2015-06-09 DIAGNOSIS — I10 Essential (primary) hypertension: Secondary | ICD-10-CM | POA: Diagnosis not present

## 2015-06-09 DIAGNOSIS — E785 Hyperlipidemia, unspecified: Secondary | ICD-10-CM | POA: Insufficient documentation

## 2015-06-09 DIAGNOSIS — I701 Atherosclerosis of renal artery: Secondary | ICD-10-CM | POA: Diagnosis not present

## 2015-06-09 IMAGING — CR DG LUMBAR SPINE COMPLETE 4+V
5 series · 5 of 5 positions shown · non-contrast
Comparison: None.

CLINICAL DATA: Bilateral lower back and hip pain 3 months. No known
injury.

EXAM:
LUMBAR SPINE - COMPLETE 4+ VIEW

[view not recorded (1 of 5)]
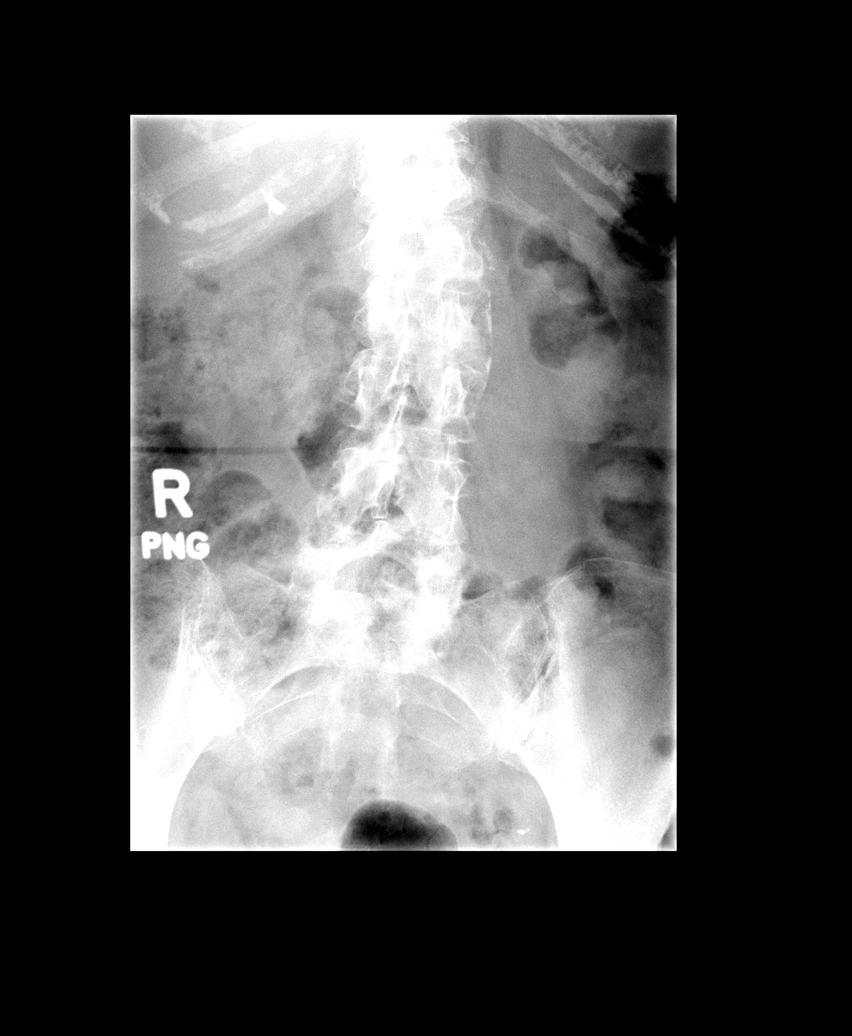

[view not recorded (2 of 5)]
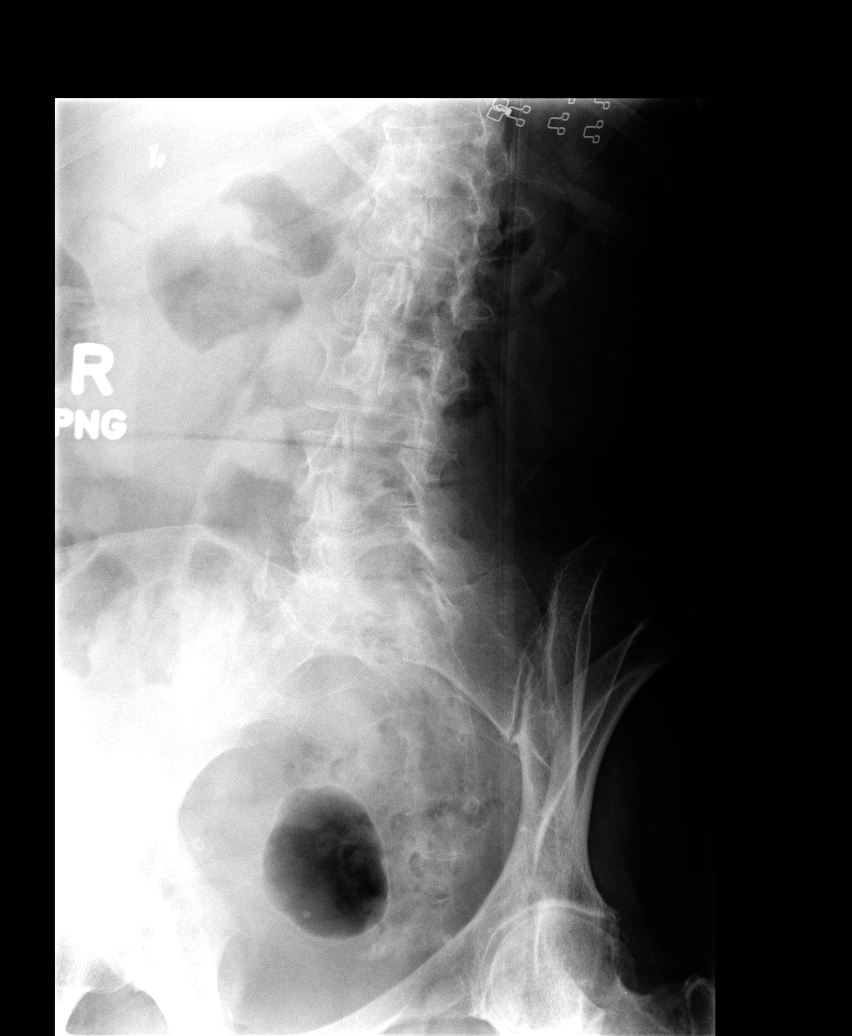

[view not recorded (3 of 5)]
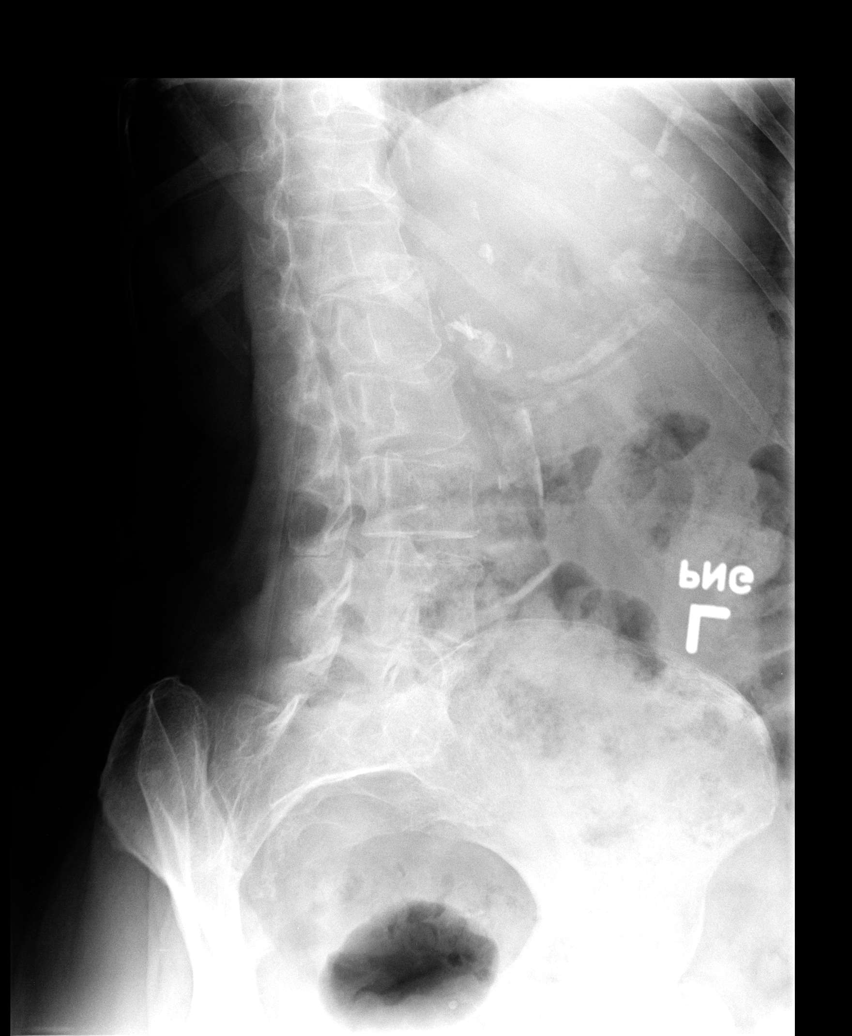

[view not recorded (4 of 5)]
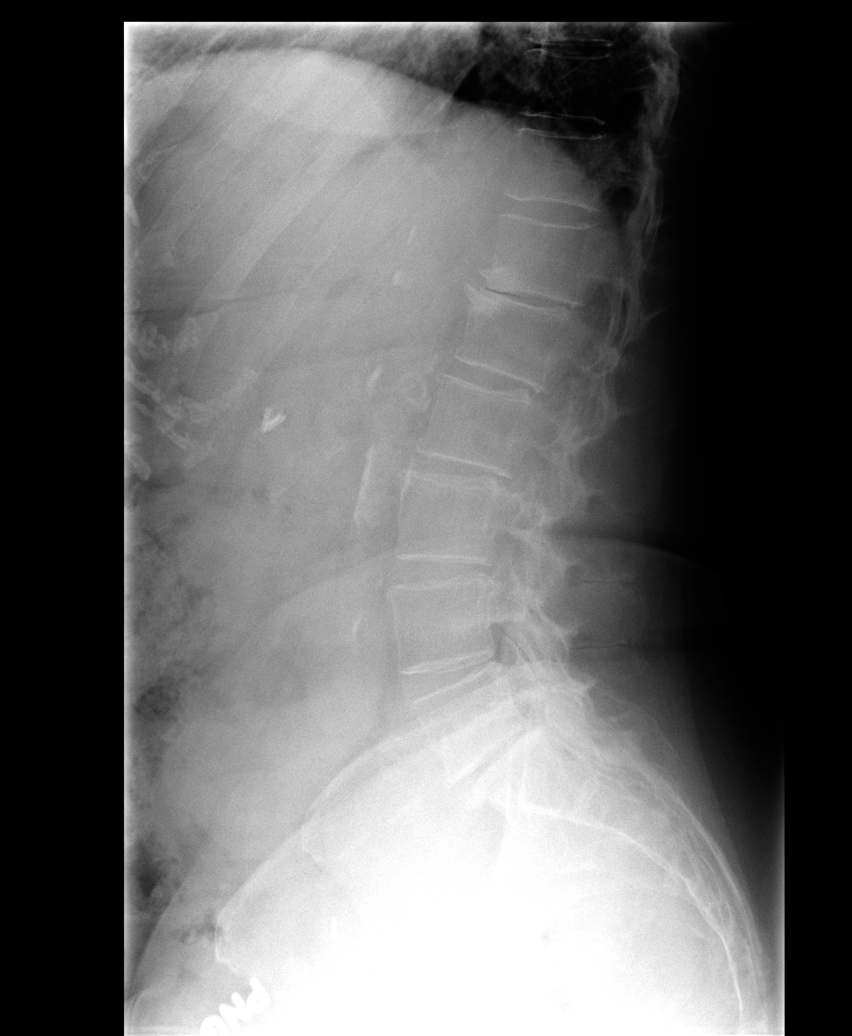

[view not recorded (5 of 5)]
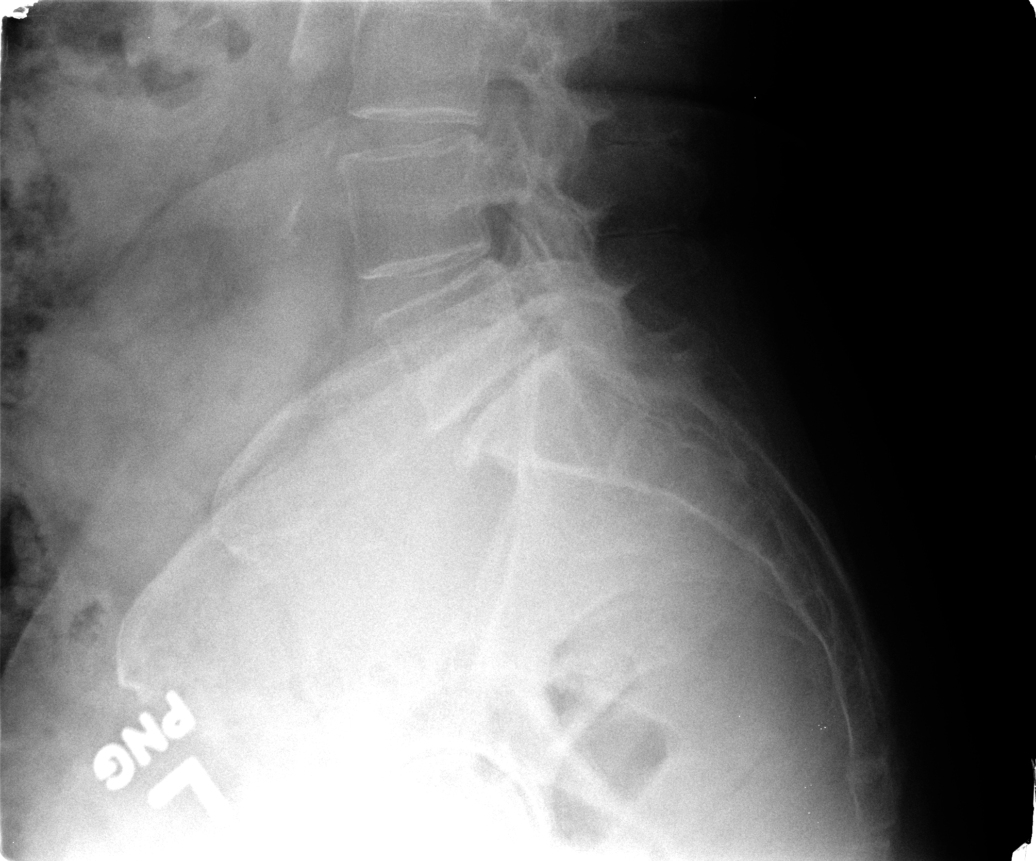

[5 of 5 positions shown; findings below may reference images not displayed]

FINDINGS: Disc space narrowing and endplate sclerosis at L5-S1 and T12-L1.
Mild degenerative facet disease at L4-5 and L5-S1. Normal alignment.
No fracture. SI joints are symmetric and unremarkable.

Aortic calcifications without aneurysm.  Prior cholecystectomy.
IMPRESSION: Degenerative disc and facet disease changes as above. No acute
findings.

## 2015-06-19 ENCOUNTER — Ambulatory Visit (INDEPENDENT_AMBULATORY_CARE_PROVIDER_SITE_OTHER): Payer: Medicare Other

## 2015-06-19 ENCOUNTER — Encounter: Payer: Self-pay | Admitting: *Deleted

## 2015-06-19 DIAGNOSIS — Z7689 Persons encountering health services in other specified circumstances: Secondary | ICD-10-CM

## 2015-06-19 DIAGNOSIS — I10 Essential (primary) hypertension: Secondary | ICD-10-CM | POA: Diagnosis not present

## 2015-06-19 NOTE — Progress Notes (Signed)
Patient ID: Jane Cox, female   DOB: [birth year], 81 y.o.   MRN: [id number] 24 hour ambulatory blood pressure monitor applied to patient.

## 2015-06-23 ENCOUNTER — Encounter: Payer: Self-pay | Admitting: Cardiovascular Disease

## 2015-06-23 ENCOUNTER — Ambulatory Visit (INDEPENDENT_AMBULATORY_CARE_PROVIDER_SITE_OTHER): Payer: Medicare Other | Admitting: Cardiovascular Disease

## 2015-06-23 VITALS — BP 150/74 | HR 60 | Ht 61.0 in | Wt 147.4 lb

## 2015-06-23 DIAGNOSIS — I10 Essential (primary) hypertension: Secondary | ICD-10-CM | POA: Diagnosis not present

## 2015-06-23 DIAGNOSIS — I701 Atherosclerosis of renal artery: Secondary | ICD-10-CM

## 2015-06-23 MED ORDER — HYDROCHLOROTHIAZIDE 12.5 MG PO CAPS: 12.5000 mg | ORAL_CAPSULE | Freq: Every day | ORAL | Status: DC

## 2015-06-23 NOTE — Progress Notes (Signed)
Patient ID: Jane Cox, female   DOB: [birth year], 82 y.o.   MRN: [id number]     Cardiology Office Note   Date:  06/23/2015   ID:  RALISHA INVERSO, DOB 1934/01/13, MRN [id number]  PCP:  Garret Reddish, MD  Cardiologist:   Jenkins Rouge, MD   No chief complaint on file.     History of Present Illness: Jane Cox is a 82 y.o. female who presents for evaluation of HTN and RAS. No recent imaging studies to review Old notes Dr Leanne Chang indicated angioplasty of renal artery around 2000 BP in general has been well controlled on 4 drug Rx.  Some sodium in diet. Sedentary.  CRF also include elevated lipids on statin No history of renal failure  Patient is from [facility].  Seen by Dr Toniann Ket [phone] with diagnosis RAS and angioplasty RRA with no stent Saw Dr Lyndel Safe in 2002 with ? F/u duplex.  She has too much salt in her diet and is sedentary.  No secondary signs of uncontrolled HTN, normal renal function, no TIA/Stroke no LVH and vision ok    06/19/15 [place] with no RAS 06/19/15 Amb BP monitor 152/59 average day   137/53 average night  Overall 149/57    Lab Results  Component Value Date   CREATININE 0.66 04/20/2015   BUN 11 04/20/2015   NA 141 04/20/2015   K 3.8 04/20/2015   CL 105 04/20/2015   CO2 27 04/20/2015       Past Medical History  Diagnosis Date  . Allergy   . HX: breast cancer   . Hypertension   . Basal cell carcinoma   . Osteopenia   . Hyperlipidemia     Past Surgical History  Procedure Laterality Date  . Cholecystectomy  1993  . Abdominal hysterectomy  1983  . Breast surgery      mastectomy  . Vein surgery    . Colonoscopy       Current Outpatient Prescriptions  Medication Sig Dispense Refill  . acetaminophen (TYLENOL) 650 MG CR tablet Take 650 mg by mouth 2 (two) times daily. Reported on 02/27/2015    . amLODipine (NORVASC) 5 MG tablet TAKE 1 TABLET BY MOUTH ONCE DAILY 90 tablet 3  . atorvastatin (LIPITOR) 20 MG tablet TAKE 1 TABLET BY MOUTH  DAILY AT 6PM 90 tablet 2  . labetalol (NORMODYNE) 100 MG tablet TAKE 1 TABLET BY MOUTH TWICE A DAY 180 tablet 3  . lisinopril (PRINIVIL,ZESTRIL) 40 MG tablet TAKE 1 TABLET (40 MG TOTAL) BY MOUTH DAILY. 90 tablet 3  . methyldopa (ALDOMET) 500 MG tablet TAKE 1 TABLET BY MOUTH TWICE A DAY 180 tablet 3  . Multiple Vitamin (MULTIVITAMIN) tablet Take 1 tablet by mouth daily.      . hydrochlorothiazide (MICROZIDE) 12.5 MG capsule Take 1 capsule (12.5 mg total) by mouth daily. 90 capsule 3   No current facility-administered medications for this visit.    Allergies:   Bacitracin-polymyxin b; Shellfish allergy; and Neosporin    Social History:  The patient  reports that she has never smoked. She has never used smokeless tobacco. She reports that she drinks about 0.5 oz of alcohol per week. She reports that she does not use illicit drugs.   Family History:  The patient's family history includes Lung cancer in her father.    ROS:  Please see the history of present illness.   Otherwise, review of systems are positive for none.   All other systems are  reviewed and negative.    PHYSICAL EXAM: VS:  BP 150/74 mmHg  Pulse 60  Ht 5\' 1"  (1.549 m)  Wt 66.86 kg (147 lb 6.4 oz)  BMI 27.87 kg/m2 , BMI Body mass index is 27.87 kg/(m^2). Affect appropriate Healthy:  appears stated age 60: normal Neck supple with no adenopathy JVP normal no bruits no thyromegaly Lungs clear with no wheezing and good diaphragmatic motion Heart:  S1/S2 no murmur, no rub, gallop or click PMI normal Abdomen: benighn, BS positve, no tenderness, no AAA no bruit.  No HSM or HJR Distal pulses intact with no bruits No edema Neuro non-focal Skin warm and dry No muscular weakness    EKG:   04/10/09  NSR no LVH normal ECG    Recent Labs: 04/20/2015: ALT 18; BUN 11; Creatinine, Ser 0.66; Hemoglobin 12.0; Platelets 252.0; Potassium 3.8; Sodium 141; TSH 2.91    Lipid Panel    Component Value Date/Time   CHOL 151  04/20/2015 0932   TRIG 205.0* 04/20/2015 0932   HDL 48.90 04/20/2015 0932   CHOLHDL 3 04/20/2015 0932   VLDL 41.0* 04/20/2015 0932   LDLCALC 51 04/12/2013 0945   LDLDIRECT 76.0 04/20/2015 0932      Wt Readings from Last 3 Encounters:  06/23/15 66.86 kg (147 lb 6.4 oz)  04/20/15 66.679 kg (147 lb)  02/27/15 65.318 kg (144 lb)      Other studies Reviewed: Additional studies/ records that were reviewed today include: Notes Dr Leanne Chang and [place] labs Epic .    ASSESSMENT AND PLAN:  1.  HTN  Some white coat component home readings high as was Amb BP monitor add low dose diuretic. She is concerned About going up on norvasc due to swelling her home BP monitor from [facility] checked and accurate 2. RAS: Usually unilateral RAS does not cause issues I suspect she has long standing essential HTN with white coat Component f/u duplex  3. Elevated lipids  Labs with new primary  Cholesterol is at goal.  Continue current dose of statin and diet Rx.  No myalgias or side effects.  F/U  LFT's in 6 months. Lab Results  Component Value Date   LDLCALC 51 04/12/2013               Current medicines are reviewed at length with the patient today.  The patient does not have concerns regarding medicines.  The following changes have been made:  no change  Labs/ tests ordered today include:    No orders of the defined types were placed in this encounter.     Disposition:   FU with me in a year      Signed, Jenkins Rouge, MD  06/23/2015 12:05 PM    [facility] [place], [place], [place]  [address] Phone: [phone]; Fax: [phone]

## 2015-06-23 NOTE — Patient Instructions (Addendum)
Medication Instructions:  Your physician has recommended you make the following change in your medication:  1-START Hydrochlorothiazide 12.5 mg by mouth daily   Lab work: NONE  Testing/Procedures: NONE  Follow-Up: Your physician wants you to follow-up in: Next Available with Dr. Johnsie Cancel.    If you need a refill on your cardiac medications before your next appointment, please call your pharmacy.

## 2015-07-18 ENCOUNTER — Ambulatory Visit: Payer: Medicare Other | Admitting: Cardiovascular Disease

## 2015-07-30 NOTE — Progress Notes (Signed)
Patient ID: Jane Cox, female   DOB: [birth year], 82 y.o.   MRN: [id number]     Cardiology Office Note   Date:  07/31/2015   ID:  Jane Cox, DOB 1933/06/22, MRN [id number]  PCP:  Garret Reddish, MD  Cardiologist:   Jenkins Rouge, MD   Chief Complaint  Patient presents with  . renal artery stenosis      History of Present Illness: Jane Cox is a 82 y.o. female who presents for evaluation of HTN and RAS. No recent imaging studies to review Old notes Dr Leanne Chang indicated angioplasty of renal artery around 2000 BP in general has been well controlled on 4 drug Rx.  Some sodium in diet. Sedentary.  CRF also include elevated lipids on statin No history of renal failure  Patient is from [facility].  Seen by Dr Toniann Ket [phone] with diagnosis RAS and angioplasty RRA with no stent Saw Dr Lyndel Safe in 2002 with ? F/u duplex.  She has too much salt in her diet and is sedentary.  No secondary signs of uncontrolled HTN, normal renal function, no TIA/Stroke no LVH and vision ok    06/19/15 [place] with no RAS 06/19/15 Amb BP monitor 152/59 average day   137/53 average night  Overall 149/57    Diuretic added for better control in April 2017 done well Home Readings excellent only needs to take 5x/week   Lab Results  Component Value Date   CREATININE 0.66 04/20/2015   BUN 11 04/20/2015   NA 141 04/20/2015   K 3.8 04/20/2015   CL 105 04/20/2015   CO2 27 04/20/2015       Past Medical History  Diagnosis Date  . Allergy   . HX: breast cancer   . Hypertension   . Basal cell carcinoma   . Osteopenia   . Hyperlipidemia     Past Surgical History  Procedure Laterality Date  . Cholecystectomy  1993  . Abdominal hysterectomy  1983  . Breast surgery      mastectomy  . Vein surgery    . Colonoscopy       Current Outpatient Prescriptions  Medication Sig Dispense Refill  . acetaminophen (TYLENOL) 650 MG CR tablet Take 650 mg by mouth 2 (two) times daily. Reported on  02/27/2015    . amLODipine (NORVASC) 5 MG tablet TAKE 1 TABLET BY MOUTH ONCE DAILY 90 tablet 3  . atorvastatin (LIPITOR) 20 MG tablet TAKE 1 TABLET BY MOUTH DAILY AT 6PM 90 tablet 2  . hydrochlorothiazide (MICROZIDE) 12.5 MG capsule Take 1 capsule (12.5 mg total) by mouth daily. 90 capsule 3  . labetalol (NORMODYNE) 100 MG tablet TAKE 1 TABLET BY MOUTH TWICE A DAY 180 tablet 3  . lisinopril (PRINIVIL,ZESTRIL) 40 MG tablet TAKE 1 TABLET (40 MG TOTAL) BY MOUTH DAILY. 90 tablet 3  . methyldopa (ALDOMET) 500 MG tablet TAKE 1 TABLET BY MOUTH TWICE A DAY 180 tablet 3  . Multiple Vitamin (MULTIVITAMIN) tablet Take 1 tablet by mouth daily.       No current facility-administered medications for this visit.    Allergies:   Bacitracin-polymyxin b; Shellfish allergy; and Neosporin    Social History:  The patient  reports that she has never smoked. She has never used smokeless tobacco. She reports that she drinks about 0.5 oz of alcohol per week. She reports that she does not use illicit drugs.   Family History:  The patient's family history includes Lung cancer in her father.  ROS:  Please see the history of present illness.   Otherwise, review of systems are positive for none.   All other systems are reviewed and negative.    PHYSICAL EXAM: VS:  BP 130/70 mmHg  Pulse 69  Ht 5\' 2"  (1.575 m)  Wt 146 lb 12.8 oz (66.588 kg)  BMI 26.84 kg/m2  SpO2 96% , BMI Body mass index is 26.84 kg/(m^2). Affect appropriate Healthy:  appears stated age 89: normal Neck supple with no adenopathy JVP normal no bruits no thyromegaly Lungs clear with no wheezing and good diaphragmatic motion Heart:  S1/S2 no murmur, no rub, gallop or click PMI normal Abdomen: benighn, BS positve, no tenderness, no AAA no bruit.  No HSM or HJR Distal pulses intact with no bruits No edema Neuro non-focal Skin warm and dry No muscular weakness    EKG:   04/10/09  NSR no LVH normal ECG    Recent Labs: 04/20/2015: ALT  18; BUN 11; Creatinine, Ser 0.66; Hemoglobin 12.0; Platelets 252.0; Potassium 3.8; Sodium 141; TSH 2.91    Lipid Panel    Component Value Date/Time   CHOL 151 04/20/2015 0932   TRIG 205.0* 04/20/2015 0932   HDL 48.90 04/20/2015 0932   CHOLHDL 3 04/20/2015 0932   VLDL 41.0* 04/20/2015 0932   LDLCALC 51 04/12/2013 0945   LDLDIRECT 76.0 04/20/2015 0932      Wt Readings from Last 3 Encounters:  07/31/15 146 lb 12.8 oz (66.588 kg)  06/23/15 147 lb 6.4 oz (66.86 kg)  04/20/15 147 lb (66.679 kg)      Other studies Reviewed: Additional studies/ records that were reviewed today include: Notes Dr Leanne Chang and [place] labs Epic .    ASSESSMENT AND PLAN:  1.  HTN  Better with diuretic   Lab Results  Component Value Date   CREATININE 0.66 04/20/2015   BUN 11 04/20/2015   NA 141 04/20/2015   K 3.8 04/20/2015   CL 105 04/20/2015   CO2 27 04/20/2015    2. RAS: Usually unilateral RAS does not cause issues I suspect she has long standing essential HTN with white coat Duplex 06/09/15 in our lab no significant RAS right 1-59% only  Component f/u duplex  3. Elevated lipids  Labs with new primary  Cholesterol is at goal.  Continue current dose of statin and diet Rx.  No myalgias or side effects.  F/U  LFT's in 6 months. Lab Results  Component Value Date   LDLCALC 51 04/12/2013              Current medicines are reviewed at length with the patient today.  The patient does not have concerns regarding medicines.  The following changes have been made:  no change  Labs/ tests ordered today include:    No orders of the defined types were placed in this encounter.     Disposition:   FU with me in a year      Signed, Jenkins Rouge, MD  07/31/2015 3:16 PM    [facility] [place], [place], [place]  [address] Phone: [phone]; Fax: [phone]

## 2015-07-31 ENCOUNTER — Ambulatory Visit (INDEPENDENT_AMBULATORY_CARE_PROVIDER_SITE_OTHER): Payer: Medicare Other | Admitting: Cardiovascular Disease

## 2015-07-31 ENCOUNTER — Encounter: Payer: Self-pay | Admitting: Cardiovascular Disease

## 2015-07-31 VITALS — BP 130/70 | HR 69 | Ht 62.0 in | Wt 146.8 lb

## 2015-07-31 DIAGNOSIS — I701 Atherosclerosis of renal artery: Secondary | ICD-10-CM

## 2015-07-31 NOTE — Patient Instructions (Addendum)

## 2015-08-24 ENCOUNTER — Other Ambulatory Visit: Payer: Self-pay

## 2015-08-24 ENCOUNTER — Telehealth: Payer: Self-pay | Admitting: Family Medicine

## 2015-08-24 MED ORDER — LISINOPRIL 40 MG PO TABS: ORAL_TABLET | ORAL | 3 refills | Status: DC

## 2015-08-24 NOTE — Telephone Encounter (Signed)
Prescription sent to pharmacy as requested. Voicemail left for patient to let her know it was sent.

## 2015-08-24 NOTE — Telephone Encounter (Signed)
Lisinopril 40 mg #90 w/refills send to [facility]

## 2015-09-07 ENCOUNTER — Other Ambulatory Visit: Payer: Self-pay | Admitting: Family Medicine

## 2015-09-25 ENCOUNTER — Ambulatory Visit (INDEPENDENT_AMBULATORY_CARE_PROVIDER_SITE_OTHER): Payer: Medicare Other | Admitting: Family Medicine

## 2015-09-25 DIAGNOSIS — Z23 Encounter for immunization: Secondary | ICD-10-CM | POA: Diagnosis not present

## 2015-09-26 DIAGNOSIS — H903 Sensorineural hearing loss, bilateral: Secondary | ICD-10-CM | POA: Diagnosis not present

## 2015-10-17 DIAGNOSIS — D2271 Melanocytic nevi of right lower limb, including hip: Secondary | ICD-10-CM | POA: Diagnosis not present

## 2015-10-17 DIAGNOSIS — D1801 Hemangioma of skin and subcutaneous tissue: Secondary | ICD-10-CM | POA: Diagnosis not present

## 2015-10-17 DIAGNOSIS — D3612 Benign neoplasm of peripheral nerves and autonomic nervous system, upper limb, including shoulder: Secondary | ICD-10-CM | POA: Diagnosis not present

## 2015-10-17 DIAGNOSIS — L853 Xerosis cutis: Secondary | ICD-10-CM | POA: Diagnosis not present

## 2015-10-17 DIAGNOSIS — Z85828 Personal history of other malignant neoplasm of skin: Secondary | ICD-10-CM | POA: Diagnosis not present

## 2015-10-17 DIAGNOSIS — L738 Other specified follicular disorders: Secondary | ICD-10-CM | POA: Diagnosis not present

## 2015-10-17 DIAGNOSIS — L821 Other seborrheic keratosis: Secondary | ICD-10-CM | POA: Diagnosis not present

## 2015-10-19 ENCOUNTER — Ambulatory Visit: Payer: Medicare Other | Admitting: Family Medicine

## 2015-10-24 ENCOUNTER — Encounter: Payer: Self-pay | Admitting: Family Medicine

## 2015-10-24 ENCOUNTER — Ambulatory Visit (INDEPENDENT_AMBULATORY_CARE_PROVIDER_SITE_OTHER): Payer: Medicare Other | Admitting: Family Medicine

## 2015-10-24 DIAGNOSIS — I701 Atherosclerosis of renal artery: Secondary | ICD-10-CM | POA: Diagnosis not present

## 2015-10-24 DIAGNOSIS — E785 Hyperlipidemia, unspecified: Secondary | ICD-10-CM

## 2015-10-24 DIAGNOSIS — I10 Essential (primary) hypertension: Secondary | ICD-10-CM

## 2015-10-24 NOTE — Assessment & Plan Note (Signed)
S: well controlled on atorvastatin 10mg . No myalgias.  Lab Results  Component Value Date   CHOL 151 04/20/2015   HDL 48.90 04/20/2015   LDLCALC 51 04/12/2013   LDLDIRECT 76.0 04/20/2015   TRIG 205.0 (H) 04/20/2015   CHOLHDL 3 04/20/2015   A/P: well controlled on atorvastatin 10mg . With noted narrowing of SMA-think best to keep LDL at or at least near 70.

## 2015-10-24 NOTE — Assessment & Plan Note (Signed)
S: Angioplasty in past. Largely stable early 2000s. 1-59% right renal artery stenosis on right remains. No stenosis on left. Cardiology yearly- had sent to consider catheterization vs. Imaging and opted for renal artery ultrasound with report 06/09/15 "Normal caliber abdominal aorta. 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 Asymmetrical kidney size, right smaller by 2.3 cm in length. 1-59% right renal artery stenosis, s/p remote PTA. Normal left renal artery . The IVC and renal veins are patent." A/P: fortunately should not be cause of resistant HTN- appears just to be white coat with home readings remaining controlled- see HTN

## 2015-10-24 NOTE — Patient Instructions (Signed)
No changes today, blood pressure looks great  iF you ever start to get dizzy with standing up or blood pressure gets even lower than it is now at home- may stop methyldopa

## 2015-10-24 NOTE — Progress Notes (Signed)
Pre visit review using our clinic review tool, if applicable. No additional management support is needed unless otherwise documented below in the visit note. 

## 2015-10-24 NOTE — Assessment & Plan Note (Signed)
S: controlled on Amlodipine 5mg , labetalol 100mg  BID, lisinopril 40mg , methyldopa 500mg  BID, hctz 12.5mg  at home with Home readings still 130/70 before diuretic now 125/58 this AM and has been as low as 115 without orthostatic symptoms. Home cuff has been verified previously. Improved control in office BP Readings from Last 3 Encounters:  10/24/15 (!) 144/70  07/31/15 130/70  06/23/15 (!) 150/74  A/P: appreciate cardiology recommendation of diuretic- if BP continues to look so excellent at home consider stopping or slowing methyldopa

## 2015-10-24 NOTE — Progress Notes (Signed)
Subjective:  Jane Cox is a 82 y.o. year old very pleasant female patient who presents for/with See problem oriented charting ROS- No chest pain or shortness of breath. No headache or blurry vision. .see any ROS included in HPI as well.   Past Medical History-  Patient Active Problem List   Diagnosis Date Noted  . Renal artery stenosis ([place]) 12/15/2013    Priority: Medium  . Hyperlipidemia 09/10/2006    Priority: Medium  . Essential hypertension 08/18/2006    Priority: Medium  . BREAST CANCER, HX OF 08/18/2006    Priority: Medium  . Skin cancer, basal cell 04/20/2015    Priority: Low  . Lumbar degenerative disc disease 05/23/2014    Priority: Low  . Arthritis of left hip 05/23/2014    Priority: Low  . Greater trochanteric bursitis of right hip 05/23/2014    Priority: Low  . Allergic rhinitis 08/18/2006    Priority: Low  . Osteopenia 08/18/2006    Priority: Low    Medications- reviewed and updated Current Outpatient Prescriptions  Medication Sig Dispense Refill  . acetaminophen (TYLENOL) 650 MG CR tablet Take 650 mg by mouth 2 (two) times daily. Reported on 02/27/2015    . amLODipine (NORVASC) 5 MG tablet TAKE 1 TABLET BY MOUTH ONCE DAILY 90 tablet 3  . atorvastatin (LIPITOR) 20 MG tablet TAKE 1 TABLET BY MOUTH DAILY AT 6PM 90 tablet 2  . hydrochlorothiazide (MICROZIDE) 12.5 MG capsule Take 1 capsule (12.5 mg total) by mouth daily. 90 capsule 3  . labetalol (NORMODYNE) 100 MG tablet TAKE 1 TABLET BY MOUTH TWICE A DAY 180 tablet 3  . lisinopril (PRINIVIL,ZESTRIL) 40 MG tablet TAKE 1 TABLET (40 MG TOTAL) BY MOUTH DAILY. 90 tablet 3  . methyldopa (ALDOMET) 500 MG tablet TAKE 1 TABLET BY MOUTH TWICE A DAY 180 tablet 3  . Multiple Vitamin (MULTIVITAMIN) tablet Take 1 tablet by mouth daily.       No current facility-administered medications for this visit.     Objective: BP (!) 144/70   Pulse 77   Temp 98.1 F (36.7 C) (Oral)   Wt 148 lb 6.4 oz (67.3 kg)   SpO2 95%    BMI 27.14 kg/m  Gen: NAD, resting comfortably CV: RRR no murmurs rubs or gallops Lungs: CTAB no crackles, wheeze, rhonchi Ext: no edema Skin: warm, dry  Assessment/Plan:  Renal artery stenosis (HCC) S: Angioplasty in past. Largely stable early 2000s. 1-59% right renal artery stenosis on right remains. No stenosis on left. Cardiology yearly- had sent to consider catheterization vs. Imaging and opted for renal artery ultrasound with report 06/09/15 "Normal caliber abdominal aorta. 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 Asymmetrical kidney size, right smaller by 2.3 cm in length. 1-59% right renal artery stenosis, s/p remote PTA. Normal left renal artery . The IVC and renal veins are patent." A/P: fortunately should not be cause of resistant HTN- appears just to be white coat with home readings remaining controlled- see HTN  Essential hypertension S: controlled on Amlodipine 5mg , labetalol 100mg  BID, lisinopril 40mg , methyldopa 500mg  BID, hctz 12.5mg  at home with Home readings still 130/70 before diuretic now 125/58 this AM and has been as low as 115 without orthostatic symptoms. Home cuff has been verified previously. Improved control in office BP Readings from Last 3 Encounters:  10/24/15 (!) 144/70  07/31/15 130/70  06/23/15 (!) 150/74  A/P: appreciate cardiology recommendation of diuretic- if BP continues to look so excellent at home consider stopping or  slowing methyldopa   Hyperlipidemia S: well controlled on atorvastatin 10mg . No myalgias.  Lab Results  Component Value Date   CHOL 151 04/20/2015   HDL 48.90 04/20/2015   LDLCALC 51 04/12/2013   LDLDIRECT 76.0 04/20/2015   TRIG 205.0 (H) 04/20/2015   CHOLHDL 3 04/20/2015   A/P: well controlled on atorvastatin 10mg . With noted narrowing of SMA-think best to keep LDL at or at least near 70.   6 months  Return precautions advised.  Garret Reddish, MD

## 2015-10-30 ENCOUNTER — Encounter: Payer: Self-pay | Admitting: Family Medicine

## 2015-11-21 DIAGNOSIS — H25011 Cortical age-related cataract, right eye: Secondary | ICD-10-CM | POA: Diagnosis not present

## 2015-11-21 DIAGNOSIS — H02839 Dermatochalasis of unspecified eye, unspecified eyelid: Secondary | ICD-10-CM | POA: Diagnosis not present

## 2015-11-21 DIAGNOSIS — I10 Essential (primary) hypertension: Secondary | ICD-10-CM | POA: Diagnosis not present

## 2015-11-21 DIAGNOSIS — H2511 Age-related nuclear cataract, right eye: Secondary | ICD-10-CM | POA: Diagnosis not present

## 2015-12-26 ENCOUNTER — Other Ambulatory Visit: Payer: Self-pay | Admitting: Family Medicine

## 2016-04-23 ENCOUNTER — Ambulatory Visit: Payer: Medicare Other | Admitting: Family Medicine

## 2016-05-16 ENCOUNTER — Ambulatory Visit (INDEPENDENT_AMBULATORY_CARE_PROVIDER_SITE_OTHER): Payer: Medicare Other | Admitting: Family Medicine

## 2016-05-16 ENCOUNTER — Encounter: Payer: Self-pay | Admitting: Family Medicine

## 2016-05-16 VITALS — BP 164/92 | HR 73 | Temp 98.0°F | Ht 62.0 in | Wt 145.4 lb

## 2016-05-16 DIAGNOSIS — E7800 Pure hypercholesterolemia, unspecified: Secondary | ICD-10-CM

## 2016-05-16 DIAGNOSIS — I701 Atherosclerosis of renal artery: Secondary | ICD-10-CM

## 2016-05-16 DIAGNOSIS — I10 Essential (primary) hypertension: Secondary | ICD-10-CM | POA: Diagnosis not present

## 2016-05-16 MED ORDER — METHYLDOPA 250 MG PO TABS: 250.0000 mg | ORAL_TABLET | Freq: Two times a day (BID) | ORAL | 3 refills | Status: DC

## 2016-05-16 MED ORDER — METHYLDOPA 250 MG PO TABS: 500.0000 mg | ORAL_TABLET | Freq: Two times a day (BID) | ORAL | 3 refills | Status: DC

## 2016-05-16 NOTE — Progress Notes (Signed)
Pre visit review using our clinic review tool, if applicable. No additional management support is needed unless otherwise documented below in the visit note. 

## 2016-05-16 NOTE — Patient Instructions (Addendum)
Ask [facility] about shingrix and what your cost would be. Send [place] a message with date of immunization if you get it at pharmacy.   Could also ask insurance about cost in office and we could give it here as well  Trial methyldopa 250mg  twice a day (reduction from 500mg  twice a day). Let me know if any blood pressures over 130/80.   No other changes

## 2016-05-16 NOTE — Progress Notes (Signed)
Subjective:  Jane Cox is a 83 y.o. year old very pleasant female patient who presents for/with See problem oriented charting ROS- No chest pain or shortness of breath. No headache or blurry vision.    Past Medical History-  Patient Active Problem List   Diagnosis Date Noted  . Renal artery stenosis ([place]) 12/15/2013    Priority: Medium  . Hyperlipidemia 09/10/2006    Priority: Medium  . Essential hypertension 08/18/2006    Priority: Medium  . BREAST CANCER, HX OF 08/18/2006    Priority: Medium  . Skin cancer, basal cell 04/20/2015    Priority: Low  . Lumbar degenerative disc disease 05/23/2014    Priority: Low  . Arthritis of left hip 05/23/2014    Priority: Low  . Greater trochanteric bursitis of right hip 05/23/2014    Priority: Low  . Allergic rhinitis 08/18/2006    Priority: Low  . Osteopenia 08/18/2006    Priority: Low    Medications- reviewed and updated Current Outpatient Prescriptions  Medication Sig Dispense Refill  . acetaminophen (TYLENOL) 650 MG CR tablet Take 650 mg by mouth 2 (two) times daily. Reported on 02/27/2015    . amLODipine (NORVASC) 5 MG tablet TAKE 1 TABLET BY MOUTH ONCE DAILY 90 tablet 3  . atorvastatin (LIPITOR) 20 MG tablet TAKE 1 TABLET BY MOUTH DAILY AT 6PM 90 tablet 1  . hydrochlorothiazide (MICROZIDE) 12.5 MG capsule Take 1 capsule (12.5 mg total) by mouth daily. 90 capsule 3  . labetalol (NORMODYNE) 100 MG tablet TAKE 1 TABLET BY MOUTH TWICE A DAY 180 tablet 3  . lisinopril (PRINIVIL,ZESTRIL) 40 MG tablet TAKE 1 TABLET (40 MG TOTAL) BY MOUTH DAILY. 90 tablet 3  . methyldopa (ALDOMET) 250 MG tablet Take 1 tablet (250 mg total) by mouth 2 (two) times daily. 180 tablet 3  . Multiple Vitamin (MULTIVITAMIN) tablet Take 1 tablet by mouth daily.       No current facility-administered medications for this visit.     Objective: BP (!) 164/92   Pulse 73   Temp 98 F (36.7 C) (Oral)   Ht 5\' 2"  (1.575 m)   Wt 145 lb 6.4 oz (66 kg)   SpO2  97%   BMI 26.59 kg/m  Gen: NAD, resting comfortably, appears stated age CV: RRR  Lungs: CTAB no  Abdomen: soft/nontender/nondistende Ext: no edema Skin: warm, dry  Assessment/Plan:  Essential hypertension S: controlled on amlodipine 5mg , labetalol 100mg  BID, lisinopril 40mg , methyldopa 500mg  BID, hctz 12.5mg  (takes 5 days of a week). Home readings- highest home # 125/66. This Am was 123/55. No orthostatic symptoms.  BP Readings from Last 3 Encounters:  05/16/16 (!) 164/92  10/24/15 (!) 144/70  07/31/15 130/70  A/P:Continue current meds:  Given excellent home control with prior verified home cuff. Given superb control there- will also trial to go to methyldopa 250mg  BID as on beers list  Hyperlipidemia S:  controlled on atorvastatin 10mg  with last LDL at 76. No myalgias. Narrowing of SMA - keeping LDL near 70 wise.  A/P: continue current medications  Renal artery stenosis (HCC) S: once again- not thought to be cause of HTN as uniilateral. Saw cardiology Dr. Johnsie Cancel 07/2015. Last year  1-59% right renal artery stenosis on right remains. No stenosis on left. A/P: encourage follow up with cardiology yearly as Dr. Johnsie Cancel mentioned in note. Doing well.     Return in about 4 months (around 09/15/2016) for follow up- or sooner if needed. come fasting and update labs. Declines  awv firmly  Meds ordered this encounter  Medications  . DISCONTD: methyldopa (ALDOMET) 250 MG tablet    Sig: Take 2 tablets (500 mg total) by mouth 2 (two) times daily.    Dispense:  180 tablet    Refill:  3  . methyldopa (ALDOMET) 250 MG tablet    Sig: Take 1 tablet (250 mg total) by mouth 2 (two) times daily.    Dispense:  180 tablet    Refill:  3    Return precautions advised.  Garret Reddish, MD

## 2016-05-17 NOTE — Assessment & Plan Note (Signed)
S:  controlled on atorvastatin 10mg  with last LDL at 76. No myalgias. Narrowing of SMA - keeping LDL near 70 wise.  A/P: continue current medications

## 2016-05-17 NOTE — Assessment & Plan Note (Signed)
S: controlled on amlodipine 5mg , labetalol 100mg  BID, lisinopril 40mg , methyldopa 500mg  BID, hctz 12.5mg  (takes 5 days of a week). Home readings- highest home # 125/66. This Am was 123/55. No orthostatic symptoms.  BP Readings from Last 3 Encounters:  05/16/16 (!) 164/92  10/24/15 (!) 144/70  07/31/15 130/70  A/P:Continue current meds:  Given excellent home control with prior verified home cuff. Given superb control there- will also trial to go to methyldopa 250mg  BID as on beers list

## 2016-05-17 NOTE — Assessment & Plan Note (Signed)
S: once again- not thought to be cause of HTN as uniilateral. Saw cardiology Dr. Johnsie Cancel 07/2015. Last year  1-59% right renal artery stenosis on right remains. No stenosis on left. A/P: encourage follow up with cardiology yearly as Dr. Johnsie Cancel mentioned in note. Doing well.

## 2016-05-28 DIAGNOSIS — Z853 Personal history of malignant neoplasm of breast: Secondary | ICD-10-CM | POA: Diagnosis not present

## 2016-05-28 DIAGNOSIS — Z1231 Encounter for screening mammogram for malignant neoplasm of breast: Secondary | ICD-10-CM | POA: Diagnosis not present

## 2016-05-28 LAB — HM MAMMOGRAPHY

## 2016-05-29 ENCOUNTER — Encounter: Payer: Self-pay | Admitting: Family Medicine

## 2016-06-14 ENCOUNTER — Encounter: Payer: Self-pay | Admitting: Family Medicine

## 2016-06-14 ENCOUNTER — Other Ambulatory Visit: Payer: Self-pay | Admitting: Family Medicine

## 2016-06-14 MED ORDER — METHYLDOPA 500 MG PO TABS: 500.0000 mg | ORAL_TABLET | Freq: Two times a day (BID) | ORAL | 3 refills | Status: DC

## 2016-07-30 DIAGNOSIS — C44619 Basal cell carcinoma of skin of left upper limb, including shoulder: Secondary | ICD-10-CM | POA: Diagnosis not present

## 2016-07-30 DIAGNOSIS — C44612 Basal cell carcinoma of skin of right upper limb, including shoulder: Secondary | ICD-10-CM | POA: Diagnosis not present

## 2016-07-30 DIAGNOSIS — D485 Neoplasm of uncertain behavior of skin: Secondary | ICD-10-CM | POA: Diagnosis not present

## 2016-07-30 DIAGNOSIS — L2089 Other atopic dermatitis: Secondary | ICD-10-CM | POA: Diagnosis not present

## 2016-07-30 DIAGNOSIS — Z85828 Personal history of other malignant neoplasm of skin: Secondary | ICD-10-CM | POA: Diagnosis not present

## 2016-07-30 DIAGNOSIS — L570 Actinic keratosis: Secondary | ICD-10-CM | POA: Diagnosis not present

## 2016-07-31 ENCOUNTER — Other Ambulatory Visit: Payer: Self-pay | Admitting: Family Medicine

## 2016-08-10 ENCOUNTER — Other Ambulatory Visit: Payer: Self-pay | Admitting: Cardiovascular Disease

## 2016-08-12 ENCOUNTER — Encounter: Payer: Self-pay | Admitting: *Deleted

## 2016-08-25 NOTE — Progress Notes (Signed)
Patient ID: Jane Cox, female   DOB: March 12, 1933, 83 y.o.   MRN: [id number]     Cardiology Office Note   Date:  08/26/2016   ID:  Jane Cox, DOB April 12, 1933, MRN [id number]  PCP:  Marin Olp, MD  Cardiologist:   Jenkins Rouge, MD   No chief complaint on file.     History of Present Illness: Jane Cox is a 83 y.o. female who presents for f/u of HTN and RAS. No recent imaging studies to review Old notes Dr Leanne Chang indicated angioplasty of renal artery around 2000 BP in general has been well controlled on 4 drug Rx.  Some sodium in diet. Sedentary.  CRF also include elevated lipids on statin No history of renal failure  Patient is from [facility].  Seen by Dr Toniann Ket [phone] with diagnosis RAS and angioplasty RRA with no stent Saw Dr Lyndel Safe in 2002 with ? F/u duplex.  She has too much salt in her diet and is sedentary.  No secondary signs of uncontrolled HTN, normal renal function, no TIA/Stroke no LVH and vision ok    06/19/15 [place] with no RAS 06/19/15 Amb BP monitor 152/59 average day   137/53 average night  Overall 149/57    Diuretic added for better control in April 2017 done well Home Readings excellent only needs to take 5x/week   Dr Yong Channel wanted her to stop/decrease her [place] BP at home more elevated when she went down to 250 bid She gets upset about stopping drug as she has been on it for decades  Lab Results  Component Value Date   CREATININE 0.66 04/20/2015   BUN 11 04/20/2015   NA 141 04/20/2015   K 3.8 04/20/2015   CL 105 04/20/2015   CO2 27 04/20/2015       Past Medical History:  Diagnosis Date  . Allergy   . Basal cell carcinoma   . HX: breast cancer   . Hyperlipidemia   . Hypertension   . Osteopenia     Past Surgical History:  Procedure Laterality Date  . ABDOMINAL HYSTERECTOMY  1983  . BREAST SURGERY     mastectomy  . CHOLECYSTECTOMY  1993  . COLONOSCOPY    . VEIN SURGERY       Current Outpatient Prescriptions    Medication Sig Dispense Refill  . acetaminophen (TYLENOL) 650 MG CR tablet Take 650 mg by mouth 2 (two) times daily. Reported on 02/27/2015    . amLODipine (NORVASC) 5 MG tablet TAKE 1 TABLET BY MOUTH ONCE DAILY 90 tablet 3  . atorvastatin (LIPITOR) 20 MG tablet Take 10 mg by mouth daily.    . hydrochlorothiazide (MICROZIDE) 12.5 MG capsule Take 12.5 mg by mouth daily. Pt takes 12.5 fives times a week    . labetalol (NORMODYNE) 100 MG tablet TAKE 1 TABLET BY MOUTH TWICE A DAY 180 tablet 3  . lisinopril (PRINIVIL,ZESTRIL) 40 MG tablet TAKE 1 TABLET (40 MG TOTAL) BY MOUTH DAILY. 90 tablet 1  . methyldopa (ALDOMET) 500 MG tablet Take 1 tablet (500 mg total) by mouth 2 (two) times daily. 180 tablet 3  . Multiple Vitamin (MULTIVITAMIN) tablet Take 1 tablet by mouth daily.       No current facility-administered medications for this visit.     Allergies:   Bacitracin-polymyxin b; Shellfish allergy; and Neosporin [neomycin-polymyxin-gramicidin]    Social History:  The patient  reports that she has never smoked. She has never used smokeless tobacco. She reports  that she drinks about 0.5 oz of alcohol per week . She reports that she does not use drugs.   Family History:  The patient's family history includes Lung cancer in her father.    ROS:  Please see the history of present illness.   Otherwise, review of systems are positive for none.   All other systems are reviewed and negative.    PHYSICAL EXAM: VS:  BP (!) 162/80   Pulse 67   Ht 5\' 2"  (1.575 m)   Wt 146 lb 9.6 oz (66.5 kg)   SpO2 97%   BMI 26.81 kg/m  , BMI Body mass index is 26.81 kg/m. Affect appropriate Healthy:  appears stated age 65: normal Neck supple with no adenopathy JVP normal no bruits no thyromegaly Lungs clear with no wheezing and good diaphragmatic motion Heart:  S1/S2 no murmur, no rub, gallop or click PMI normal Abdomen: benighn, BS positve, no tenderness, no AAA no bruit.  No HSM or HJR Distal pulses  intact with no bruits No edema Neuro non-focal Skin warm and dry No muscular weakness    EKG:   04/10/09  NSR no LVH normal ECG  08/26/16  SR rate 67 ICRBBB nonspecific ST changes    Recent Labs: No results found for requested labs within last 8760 hours.    Lipid Panel    Component Value Date/Time   CHOL 151 04/20/2015 0932   TRIG 205.0 (H) 04/20/2015 0932   HDL 48.90 04/20/2015 0932   CHOLHDL 3 04/20/2015 0932   VLDL 41.0 (H) 04/20/2015 0932   LDLCALC 51 04/12/2013 0945   LDLDIRECT 76.0 04/20/2015 0932      Wt Readings from Last 3 Encounters:  08/26/16 146 lb 9.6 oz (66.5 kg)  05/16/16 145 lb 6.4 oz (66 kg)  10/24/15 148 lb 6.4 oz (67.3 kg)      Other studies Reviewed: Additional studies/ records that were reviewed today include: Notes Dr Leanne Chang and [facility] labs Epic Renal duplex done 06/09/15  .    ASSESSMENT AND PLAN:  1.  HTN  She has done well on regimen including aldomet 500 bid and I see no reason to change Her home readings are fine  2. RAS: Usually unilateral RAS does not cause issues I suspect she has long standing essential HTN with white coat Duplex 06/09/15 in our lab no significant RAS right 1-59% only   3. Elevated lipids  Labs with new primary  Cholesterol is at goal.  Continue current dose of statin and diet Rx.  No myalgias or side effects.  F/U  LFT's in 6 months. Lab Results  Component Value Date   LDLCALC 51 04/12/2013            Current medicines are reviewed at length with the patient today.  The patient does not have concerns regarding medicines.  The following changes have been made:  no change  Labs/ tests ordered today include:    No orders of the defined types were placed in this encounter.    Disposition:   FU cardiology PRN     Signed, Jenkins Rouge, MD  08/26/2016 2:31 PM    [facility], [place], [place]  [address] Phone: [phone]; Fax: [phone]

## 2016-08-26 ENCOUNTER — Encounter: Payer: Self-pay | Admitting: Cardiovascular Disease

## 2016-08-26 ENCOUNTER — Ambulatory Visit (INDEPENDENT_AMBULATORY_CARE_PROVIDER_SITE_OTHER): Payer: Medicare Other | Admitting: Cardiovascular Disease

## 2016-08-26 VITALS — BP 162/80 | HR 67 | Ht 62.0 in | Wt 146.6 lb

## 2016-08-26 DIAGNOSIS — I701 Atherosclerosis of renal artery: Secondary | ICD-10-CM

## 2016-08-26 DIAGNOSIS — I10 Essential (primary) hypertension: Secondary | ICD-10-CM

## 2016-08-26 NOTE — Patient Instructions (Addendum)
Medication Instructions:  Your physician recommends that you continue on your current medications as directed. Please refer to the Current Medication list given to you today.  Labwork: NONE  Testing/Procedures: NONE  Follow-Up: Your physician wants you to follow-up as needed with  Dr. Nishan.    If you need a refill on your cardiac medications before your next appointment, please call your pharmacy.    

## 2016-08-27 NOTE — Addendum Note (Signed)
Addended by: Mendel Ryder on: 08/27/2016 03:30 PM   Modules accepted: Orders

## 2016-08-27 NOTE — Progress Notes (Signed)
Thanks Dr. Johnsie Cancel. We trialed it a few months ago and BP went up so I told her to go back to 500mg  BID- mychart message in may which she responded to. Didn't realize she was still upset about prior trial - thanks for comforting her.

## 2016-09-05 ENCOUNTER — Other Ambulatory Visit: Payer: Self-pay | Admitting: Family Medicine

## 2016-09-13 DIAGNOSIS — C44619 Basal cell carcinoma of skin of left upper limb, including shoulder: Secondary | ICD-10-CM | POA: Diagnosis not present

## 2016-09-13 DIAGNOSIS — Z85828 Personal history of other malignant neoplasm of skin: Secondary | ICD-10-CM | POA: Diagnosis not present

## 2016-09-13 DIAGNOSIS — C44612 Basal cell carcinoma of skin of right upper limb, including shoulder: Secondary | ICD-10-CM | POA: Diagnosis not present

## 2016-09-16 ENCOUNTER — Ambulatory Visit (INDEPENDENT_AMBULATORY_CARE_PROVIDER_SITE_OTHER): Payer: Medicare Other | Admitting: Family Medicine

## 2016-09-16 ENCOUNTER — Encounter: Payer: Self-pay | Admitting: Family Medicine

## 2016-09-16 DIAGNOSIS — E785 Hyperlipidemia, unspecified: Secondary | ICD-10-CM | POA: Diagnosis not present

## 2016-09-16 DIAGNOSIS — I701 Atherosclerosis of renal artery: Secondary | ICD-10-CM

## 2016-09-16 DIAGNOSIS — I10 Essential (primary) hypertension: Secondary | ICD-10-CM | POA: Diagnosis not present

## 2016-09-16 LAB — COMPREHENSIVE METABOLIC PANEL
ALK PHOS: 88 U/L (ref 39–117)
ALT: 15 U/L (ref 0–35)
AST: 13 U/L (ref 0–37)
Albumin: 4.3 g/dL (ref 3.5–5.2)
BILIRUBIN TOTAL: 1.2 mg/dL (ref 0.2–1.2)
BUN: 11 mg/dL (ref 6–23)
CO2: 28 meq/L (ref 19–32)
CREATININE: 0.67 mg/dL (ref 0.40–1.20)
Calcium: 10 mg/dL (ref 8.4–10.5)
Chloride: 103 mEq/L (ref 96–112)
GFR: 89.76 mL/min (ref >60.00)
GLUCOSE: 110 mg/dL — AB (ref 70–99)
Potassium: 4.3 mEq/L (ref 3.5–5.1)
Sodium: 140 mEq/L (ref 135–145)
TOTAL PROTEIN: 6.8 g/dL (ref 6.0–8.3)

## 2016-09-16 LAB — LIPID PANEL
CHOL/HDL RATIO: 3
Cholesterol: 125 mg/dL (ref 0–200)
HDL: 48.7 mg/dL (ref >39.00)
LDL Cholesterol: 48 mg/dL (ref 0–99)
NONHDL: 76.24
Triglycerides: 140 mg/dL (ref 0.0–149.0)
VLDL: 28 mg/dL (ref 0.0–40.0)

## 2016-09-16 LAB — CBC
HCT: 37.3 % (ref 36.0–46.0)
HEMOGLOBIN: 12.2 g/dL (ref 12.0–15.0)
MCHC: 32.7 g/dL (ref 30.0–36.0)
MCV: 89.6 fl (ref 78.0–100.0)
Platelets: 250 10*3/uL (ref 150.0–400.0)
RBC: 4.16 Mil/uL (ref 3.87–5.11)
RDW: 13.9 % (ref 11.5–15.5)
WBC: 8.5 10*3/uL (ref 4.0–10.5)

## 2016-09-16 NOTE — Assessment & Plan Note (Signed)
S:  controlled on atorvastatin 10mg . No myalgias.  A/P: update lipids today- she came fasting thaknfully

## 2016-09-16 NOTE — Assessment & Plan Note (Signed)
S: unilateral- unlikely to cause HTN issues. Followed by Dr. Johnsie Cancel A/P: continue BP and lipid control

## 2016-09-16 NOTE — Patient Instructions (Addendum)
Please stop by lab before you go  Blood pressure looks great back on full dose- thanks for trying- we will quit trying to reduce it at this point  No changes   Get your flu shot this fall as you always do

## 2016-09-16 NOTE — Progress Notes (Signed)
Subjective:  Jane Cox is a 83 y.o. year old very pleasant female patient who presents for/with See problem oriented charting ROS- No chest pain or shortness of breath. No headache or blurry vision.    Past Medical History-  Patient Active Problem List   Diagnosis Date Noted  . Renal artery stenosis ([place]) 12/15/2013    Priority: Medium  . Hyperlipidemia 09/10/2006    Priority: Medium  . Essential hypertension 08/18/2006    Priority: Medium  . BREAST CANCER, HX OF 08/18/2006    Priority: Medium  . Skin cancer, basal cell 04/20/2015    Priority: Low  . Lumbar degenerative disc disease 05/23/2014    Priority: Low  . Arthritis of left hip 05/23/2014    Priority: Low  . Greater trochanteric bursitis of right hip 05/23/2014    Priority: Low  . Allergic rhinitis 08/18/2006    Priority: Low  . Osteopenia 08/18/2006    Priority: Low    Medications- reviewed and updated Current Outpatient Prescriptions  Medication Sig Dispense Refill  . acetaminophen (TYLENOL) 650 MG CR tablet Take 650 mg by mouth 2 (two) times daily. Reported on 02/27/2015    . amLODipine (NORVASC) 5 MG tablet TAKE 1 TABLET BY MOUTH ONCE DAILY 90 tablet 3  . atorvastatin (LIPITOR) 20 MG tablet Take 10 mg by mouth daily.    . hydrochlorothiazide (MICROZIDE) 12.5 MG capsule Take 12.5 mg by mouth daily. Pt takes 12.5 fives times a week    . labetalol (NORMODYNE) 100 MG tablet TAKE 1 TABLET BY MOUTH TWICE A DAY 180 tablet 3  . lisinopril (PRINIVIL,ZESTRIL) 40 MG tablet TAKE 1 TABLET (40 MG TOTAL) BY MOUTH DAILY. 90 tablet 1  . methyldopa (ALDOMET) 500 MG tablet Take 1 tablet (500 mg total) by mouth 2 (two) times daily. 180 tablet 3  . Multiple Vitamin (MULTIVITAMIN) tablet Take 1 tablet by mouth daily.       No current facility-administered medications for this visit.     Objective: BP (!) 142/70 (BP Location: Left Arm, Patient Position: Sitting, Cuff Size: Normal)   Pulse 60   Temp 98 F (36.7 C) (Oral)   Wt  144 lb (65.3 kg)   SpO2 96%   BMI 26.34 kg/m  Gen: NAD, resting comfortably CV: RRR no murmurs rubs or gallops Lungs: CTAB no crackles, wheeze, rhonchi Abdomen: soft/nontender/nondistended/normal bowel sounds.  Ext: no edema Skin: warm, dry  Assessment/Plan:  taking tylenol twice a day for hips, back and left knee pain.   Patient Instructions  Please stop by lab before you go  Blood pressure looks great back on full dose- thanks for trying- we will quit trying to reduce it at this point  No changes   Get your flu shot this fall as you always do      Hyperlipidemia S:  controlled on atorvastatin 10mg . No myalgias.  A/P: update lipids today- she came fasting thaknfully  Essential hypertension S: controlled on amlodipine 5mg , labetalol 100mg  BID, lisinopril 40mg , methyldopa 500mg  BID, hctz 12.5mg  (5 days a week). Home readings remain excellent with average in 130s over 70s- only had a few readings with systolic just above 440- that is when she went back to full methyldopa dose of 500mg  BID from 250mg  BID- and all Bps since that time hav ebene 120s/60s. Home cuff verified previously BP Readings from Last 3 Encounters:  09/16/16 (!) 142/70  08/26/16 (!) 162/80  05/16/16 (!) 164/92  A/P: We discussed blood pressure goal of <140/90. Continue  current meds  Renal artery stenosis (HCC) S: unilateral- unlikely to cause HTN issues. Followed by Dr. Johnsie Cancel A/P: continue BP and lipid control   Return in about 6 months (around 03/19/2017) for follow up- or sooner if needed.  Orders Placed This Encounter  Procedures  . CBC    [place]  . Comprehensive metabolic panel    [place]    Order Specific Question:   Has the patient fasted?    Answer:   No  . Lipid panel    [place]    Order Specific Question:   Has the patient fasted?    Answer:   No   Return precautions advised.  Garret Reddish, MD

## 2016-09-16 NOTE — Assessment & Plan Note (Signed)
S: controlled on amlodipine 5mg , labetalol 100mg  BID, lisinopril 40mg , methyldopa 500mg  BID, hctz 12.5mg  (5 days a week). Home readings remain excellent with average in 130s over 70s- only had a few readings with systolic just above 336- that is when she went back to full methyldopa dose of 500mg  BID from 250mg  BID- and all Bps since that time hav ebene 120s/60s. Home cuff verified previously BP Readings from Last 3 Encounters:  09/16/16 (!) 142/70  08/26/16 (!) 162/80  05/16/16 (!) 164/92  A/P: We discussed blood pressure goal of <140/90. Continue current meds

## 2016-09-24 ENCOUNTER — Ambulatory Visit (INDEPENDENT_AMBULATORY_CARE_PROVIDER_SITE_OTHER): Payer: Medicare Other

## 2016-09-24 DIAGNOSIS — Z23 Encounter for immunization: Secondary | ICD-10-CM | POA: Diagnosis not present

## 2016-10-01 DIAGNOSIS — H903 Sensorineural hearing loss, bilateral: Secondary | ICD-10-CM | POA: Diagnosis not present

## 2016-10-06 ENCOUNTER — Other Ambulatory Visit: Payer: Self-pay | Admitting: Family Medicine

## 2016-10-17 DIAGNOSIS — Z85828 Personal history of other malignant neoplasm of skin: Secondary | ICD-10-CM | POA: Diagnosis not present

## 2016-10-17 DIAGNOSIS — L570 Actinic keratosis: Secondary | ICD-10-CM | POA: Diagnosis not present

## 2016-10-17 DIAGNOSIS — L7211 Pilar cyst: Secondary | ICD-10-CM | POA: Diagnosis not present

## 2016-10-17 DIAGNOSIS — D692 Other nonthrombocytopenic purpura: Secondary | ICD-10-CM | POA: Diagnosis not present

## 2016-10-17 DIAGNOSIS — D3612 Benign neoplasm of peripheral nerves and autonomic nervous system, upper limb, including shoulder: Secondary | ICD-10-CM | POA: Diagnosis not present

## 2016-10-17 DIAGNOSIS — D1801 Hemangioma of skin and subcutaneous tissue: Secondary | ICD-10-CM | POA: Diagnosis not present

## 2016-10-17 DIAGNOSIS — L821 Other seborrheic keratosis: Secondary | ICD-10-CM | POA: Diagnosis not present

## 2016-10-17 DIAGNOSIS — L84 Corns and callosities: Secondary | ICD-10-CM | POA: Diagnosis not present

## 2016-10-17 DIAGNOSIS — D2271 Melanocytic nevi of right lower limb, including hip: Secondary | ICD-10-CM | POA: Diagnosis not present

## 2016-10-17 DIAGNOSIS — L738 Other specified follicular disorders: Secondary | ICD-10-CM | POA: Diagnosis not present

## 2016-10-17 DIAGNOSIS — D3617 Benign neoplasm of peripheral nerves and autonomic nervous system of trunk, unspecified: Secondary | ICD-10-CM | POA: Diagnosis not present

## 2016-10-31 ENCOUNTER — Ambulatory Visit: Payer: Medicare Other | Admitting: Family Medicine

## 2016-11-09 ENCOUNTER — Other Ambulatory Visit: Payer: Self-pay | Admitting: Cardiovascular Disease

## 2016-11-18 ENCOUNTER — Other Ambulatory Visit: Payer: Self-pay

## 2016-11-18 MED ORDER — LISINOPRIL 40 MG PO TABS: ORAL_TABLET | ORAL | 1 refills | Status: DC

## 2017-01-14 ENCOUNTER — Telehealth: Payer: Self-pay | Admitting: Family Medicine

## 2017-01-14 ENCOUNTER — Other Ambulatory Visit: Payer: Self-pay | Admitting: Family Medicine

## 2017-01-14 NOTE — Telephone Encounter (Signed)
Please advise on the medication request below.

## 2017-01-14 NOTE — Telephone Encounter (Signed)
Copied from [phone]. Topic: Quick Communication - See Telephone Encounter >> Jan 14, 2017  2:57 PM Vernona Rieger wrote: CRM for notification. See Telephone encounter for:   01/14/17.  [facility] called and stated they sent over a fax on the 13th for AMLODIPINE 5mg . Please advise. Fax number is [phone], needs a 90 day supply

## 2017-01-15 NOTE — Telephone Encounter (Signed)
Prescription sent to pharmacy yesterday.

## 2017-02-10 ENCOUNTER — Telehealth: Payer: Self-pay | Admitting: Family Medicine

## 2017-02-10 NOTE — Telephone Encounter (Signed)
The patient requests to be notified when the prescription has been ordered.   MEDICATION: lisinopril (PRINIVIL,ZESTRIL) 40 MG tablet  PHARMACY:   [id number] - [place], [place] - [address]  IS THIS A 90 DAY SUPPLY : Yes  IS PATIENT OUT OF MEDICATION: No  IF NOT; HOW MUCH IS LEFT: 10 tablets  LAST APPOINTMENT DATE: @12 /18/2018  NEXT APPOINTMENT DATE:@4 /11/2017  OTHER COMMENTS:    **Let patient know to contact pharmacy at the end of the day to make sure medication is ready. **  ** Please notify patient to allow 48-72 hours to process**  **Encourage patient to contact the pharmacy for refills or they can request refills through [facility]**

## 2017-02-11 MED ORDER — LISINOPRIL 40 MG PO TABS: ORAL_TABLET | ORAL | 1 refills | Status: DC

## 2017-02-11 NOTE — Telephone Encounter (Signed)
Rx refill for Lisinopril 40 mg sent to [facility] as requested.

## 2017-02-27 ENCOUNTER — Other Ambulatory Visit: Payer: Self-pay

## 2017-02-27 MED ORDER — METHYLDOPA 500 MG PO TABS: 500.0000 mg | ORAL_TABLET | Freq: Two times a day (BID) | ORAL | 3 refills | Status: DC

## 2017-03-20 ENCOUNTER — Ambulatory Visit: Payer: Medicare Other | Admitting: Family Medicine

## 2017-04-08 DIAGNOSIS — H2513 Age-related nuclear cataract, bilateral: Secondary | ICD-10-CM | POA: Diagnosis not present

## 2017-04-08 DIAGNOSIS — H25043 Posterior subcapsular polar age-related cataract, bilateral: Secondary | ICD-10-CM | POA: Diagnosis not present

## 2017-04-08 DIAGNOSIS — H25013 Cortical age-related cataract, bilateral: Secondary | ICD-10-CM | POA: Diagnosis not present

## 2017-04-08 DIAGNOSIS — H02839 Dermatochalasis of unspecified eye, unspecified eyelid: Secondary | ICD-10-CM | POA: Diagnosis not present

## 2017-04-08 DIAGNOSIS — H2511 Age-related nuclear cataract, right eye: Secondary | ICD-10-CM | POA: Diagnosis not present

## 2017-05-08 ENCOUNTER — Encounter: Payer: Self-pay | Admitting: Family Medicine

## 2017-05-08 ENCOUNTER — Ambulatory Visit (INDEPENDENT_AMBULATORY_CARE_PROVIDER_SITE_OTHER): Payer: Medicare Other | Admitting: Family Medicine

## 2017-05-08 VITALS — BP 124/68 | HR 72 | Temp 98.2°F | Ht 62.0 in | Wt 141.6 lb

## 2017-05-08 DIAGNOSIS — I701 Atherosclerosis of renal artery: Secondary | ICD-10-CM

## 2017-05-08 DIAGNOSIS — I10 Essential (primary) hypertension: Secondary | ICD-10-CM

## 2017-05-08 DIAGNOSIS — E785 Hyperlipidemia, unspecified: Secondary | ICD-10-CM | POA: Diagnosis not present

## 2017-05-08 MED ORDER — HYDROCHLOROTHIAZIDE 12.5 MG PO CAPS: 12.5000 mg | ORAL_CAPSULE | Freq: Every day | ORAL | 2 refills | Status: DC

## 2017-05-08 MED ORDER — METHYLDOPA 500 MG PO TABS: 500.0000 mg | ORAL_TABLET | Freq: Two times a day (BID) | ORAL | 3 refills | Status: DC

## 2017-05-08 NOTE — Assessment & Plan Note (Signed)
S: controlled on amlodipine 5mg , labetolol 100mg  BID, lisinopril 40mg , methyldopa 500mg  BID, hctz 12.5mg  ( 5 days a week- cant tolerate going to bathroom more). Methyldopa apparently is no longer being manufactured per [facility].  BP Readings from Last 3 Encounters:  05/08/17 124/68  09/16/16 (!) 142/70  08/26/16 (!) 162/80  A/P: We discussed blood pressure goal of <140/90. Continue current meds if able- concern that we will not be able to locate methyldopa. may go up on labetalol or amlodipine if methyldopa cant be found for her

## 2017-05-08 NOTE — Progress Notes (Signed)
Subjective:  Jane Cox is a 84 y.o. year old very pleasant female patient who presents for/with See problem oriented charting ROS- No chest pain or shortness of breath. No headache or blurry vision.    Past Medical History-  Patient Active Problem List   Diagnosis Date Noted  . Renal artery stenosis ([place]) 12/15/2013    Priority: Medium  . Hyperlipidemia 09/10/2006    Priority: Medium  . Essential hypertension 08/18/2006    Priority: Medium  . BREAST CANCER, HX OF 08/18/2006    Priority: Medium  . Skin cancer, basal cell 04/20/2015    Priority: Low  . Lumbar degenerative disc disease 05/23/2014    Priority: Low  . Arthritis of left hip 05/23/2014    Priority: Low  . Greater trochanteric bursitis of right hip 05/23/2014    Priority: Low  . Allergic rhinitis 08/18/2006    Priority: Low  . Osteopenia 08/18/2006    Priority: Low    Medications- reviewed and updated Current Outpatient Medications  Medication Sig Dispense Refill  . acetaminophen (TYLENOL) 650 MG CR tablet Take 650 mg by mouth 2 (two) times daily. Reported on 02/27/2015    . amLODipine (NORVASC) 5 MG tablet TAKE 1 TABLET BY MOUTH ONCE DAILY 90 tablet 3  . atorvastatin (LIPITOR) 20 MG tablet Take 10 mg by mouth daily.    . hydrochlorothiazide (MICROZIDE) 12.5 MG capsule Take 1 capsule (12.5 mg total) by mouth daily. 90 capsule 2  . labetalol (NORMODYNE) 100 MG tablet TAKE 1 TABLET BY MOUTH TWICE A DAY 180 tablet 3  . lisinopril (PRINIVIL,ZESTRIL) 40 MG tablet TAKE 1 TABLET (40 MG TOTAL) BY MOUTH DAILY. 90 tablet 1  . methyldopa (ALDOMET) 500 MG tablet Take 1 tablet (500 mg total) by mouth 2 (two) times daily. 180 tablet 3  . Multiple Vitamin (MULTIVITAMIN) tablet Take 1 tablet by mouth daily.       No current facility-administered medications for this visit.     Objective: BP 124/68 (BP Location: Left Arm, Patient Position: Sitting, Cuff Size: Large)   Pulse 72   Temp 98.2 F (36.8 C) (Oral)   Ht 5\' 2"   (1.575 m)   Wt 141 lb 9.6 oz (64.2 kg)   SpO2 95%   BMI 25.90 kg/m  Gen: NAD, resting comfortably CV: RRR no murmurs rubs or gallops Lungs: CTAB no crackles, wheeze, rhonchi Abdomen: soft/nontender/nondistended/normal bowel sounds. Ext: no edema Skin: warm, dry Neuro: normal gait  Assessment/Plan:  Essential hypertension S: controlled on amlodipine 5mg , labetolol 100mg  BID, lisinopril 40mg , methyldopa 500mg  BID, hctz 12.5mg  ( 5 days a week- cant tolerate going to bathroom more). Methyldopa apparently is no longer being manufactured per [facility].  BP Readings from Last 3 Encounters:  05/08/17 124/68  09/16/16 (!) 142/70  08/26/16 (!) 162/80  A/P: We discussed blood pressure goal of <140/90. Continue current meds if able- concern that we will not be able to locate methyldopa. may go up on labetalol or amlodipine if methyldopa cant be found for her  Hyperlipidemia S: well controlled on atorvastatin 10mg  with LDL unde r70.   A/P: continue current rx   Renal artery stenosis ([facility]) S: renal artery stenosis but unilateral so should not cause BP issues A/P: no regular monitoring planned other than GFR yearly. Cardiology also has released her per her report  Future Appointments  Date Time Provider [place]  11/07/2017 10:00 AM Marin Olp, MD [facility]   Return in about 6 months (around 11/07/2017) for follow  up- or sooner if needed. Declines AWV firmly  Meds ordered this encounter  Medications  . methyldopa (ALDOMET) 500 MG tablet    Sig: Take 1 tablet (500 mg total) by mouth 2 (two) times daily.    Dispense:  180 tablet    Refill:  3  . hydrochlorothiazide (MICROZIDE) 12.5 MG capsule    Sig: Take 1 capsule (12.5 mg total) by mouth daily.    Dispense:  90 capsule    Refill:  2   Return precautions advised.  Garret Reddish, MD

## 2017-05-08 NOTE — Assessment & Plan Note (Signed)
S: well controlled on atorvastatin 10mg  with LDL unde r70.   A/P: continue current rx

## 2017-05-08 NOTE — Patient Instructions (Signed)
Try methyldopa at [place]  If this doesn't work- we may either try  1. Going up on labetalol 2. Going up on amlodipine

## 2017-05-08 NOTE — Assessment & Plan Note (Signed)
S: renal artery stenosis but unilateral so should not cause BP issues A/P: no regular monitoring planned other than GFR yearly. Cardiology also has released her per her report

## 2017-05-23 ENCOUNTER — Encounter: Payer: Self-pay | Admitting: Family Medicine

## 2017-05-23 MED ORDER — LABETALOL HCL 200 MG PO TABS: 200.0000 mg | ORAL_TABLET | Freq: Two times a day (BID) | ORAL | 5 refills | Status: DC

## 2017-06-09 DIAGNOSIS — H2511 Age-related nuclear cataract, right eye: Secondary | ICD-10-CM | POA: Diagnosis not present

## 2017-06-10 DIAGNOSIS — Z961 Presence of intraocular lens: Secondary | ICD-10-CM | POA: Diagnosis not present

## 2017-06-10 DIAGNOSIS — H2512 Age-related nuclear cataract, left eye: Secondary | ICD-10-CM | POA: Diagnosis not present

## 2017-06-19 ENCOUNTER — Ambulatory Visit (INDEPENDENT_AMBULATORY_CARE_PROVIDER_SITE_OTHER): Payer: Medicare Other | Admitting: Family Medicine

## 2017-06-19 ENCOUNTER — Encounter: Payer: Self-pay | Admitting: Family Medicine

## 2017-06-19 VITALS — BP 124/76 | HR 72 | Temp 98.0°F | Ht 62.0 in | Wt 140.4 lb

## 2017-06-19 DIAGNOSIS — I701 Atherosclerosis of renal artery: Secondary | ICD-10-CM

## 2017-06-19 DIAGNOSIS — I10 Essential (primary) hypertension: Secondary | ICD-10-CM

## 2017-06-19 NOTE — Assessment & Plan Note (Signed)
S: controlled on  amlodipine 5mg , labetalol 200mg  BID, lisinopril 40mg , hctz 12.5mg  - only takes diuretic 5 days week (needs break from polyuria). Methylodopa no longer available.  She had some side effects the first night which have largely resolved-the only side effect she continues to have is runny nose about 3 times a day-or at least she thinks this is a side effect-allergies certainly possibility.  She is also using 3 different eyedrops after her recent cataract surgery and has a follow-up cataract surgery for the other eye coming up.  Home #s all under 140/90 either on initial check or on repeat after 5 minutes if elevated BP Readings from Last 3 Encounters:  06/19/17 124/76. We verified home cuff today (readings systolic 161 vs 096 and diastolic 68 vs 64 on repeat- she admitted to some anxiety)  05/08/17 124/68  09/16/16 (!) 142/70  A/P: We discussed blood pressure goal of <140/90- at goal at home and on initial check here.  Home cuff verified today  Continue- amlodipine 5mg , labetalol 200mg  BID, lisinopril 40mg , hctz 12.5mg 

## 2017-06-19 NOTE — Progress Notes (Signed)
Subjective:  Jane Cox is a 84 y.o. year old very pleasant female patient who presents for/with See problem oriented charting ROS- No chest pain or shortness of breath. No headache or edema.  Has had some runny nose.    Past Medical History-  Patient Active Problem List   Diagnosis Date Noted  . Renal artery stenosis ([place]) 12/15/2013    Priority: Medium  . Hyperlipidemia 09/10/2006    Priority: Medium  . Essential hypertension 08/18/2006    Priority: Medium  . BREAST CANCER, HX OF 08/18/2006    Priority: Medium  . Skin cancer, basal cell 04/20/2015    Priority: Low  . Lumbar degenerative disc disease 05/23/2014    Priority: Low  . Arthritis of left hip 05/23/2014    Priority: Low  . Greater trochanteric bursitis of right hip 05/23/2014    Priority: Low  . Allergic rhinitis 08/18/2006    Priority: Low  . Osteopenia 08/18/2006    Priority: Low    Medications- reviewed and updated Current Outpatient Medications  Medication Sig Dispense Refill  . acetaminophen (TYLENOL) 650 MG CR tablet Take 650 mg by mouth 2 (two) times daily. Reported on 02/27/2015    . amLODipine (NORVASC) 5 MG tablet TAKE 1 TABLET BY MOUTH ONCE DAILY 90 tablet 3  . atorvastatin (LIPITOR) 20 MG tablet Take 10 mg by mouth daily.    . hydrochlorothiazide (MICROZIDE) 12.5 MG capsule Take 1 capsule (12.5 mg total) by mouth daily. 90 capsule 2  . labetalol (NORMODYNE) 200 MG tablet Take 1 tablet (200 mg total) by mouth 2 (two) times daily. 60 tablet 5  . lisinopril (PRINIVIL,ZESTRIL) 40 MG tablet TAKE 1 TABLET (40 MG TOTAL) BY MOUTH DAILY. 90 tablet 1  . Multiple Vitamin (MULTIVITAMIN) tablet Take 1 tablet by mouth daily.       Objective: BP 124/76 (BP Location: Left Arm, Patient Position: Sitting, Cuff Size: Normal)   Pulse 72   Temp 98 F (36.7 C) (Oral)   Ht 5\' 2"  (1.575 m)   Wt 140 lb 6.4 oz (63.7 kg)   SpO2 96%   BMI 25.68 kg/m  Gen: NAD, resting comfortably CV: RRR no murmurs rubs or  gallops Lungs: CTAB no crackles, wheeze, rhonchi Abdomen: soft/nontender/nondistended/normal bowel sounds. Ext: no edema Skin: warm, dry Neuro: Normal gait and speech  Assessment/Plan:  Essential hypertension S: controlled on  amlodipine 5mg , labetalol 200mg  BID, lisinopril 40mg , hctz 12.5mg  - only takes diuretic 5 days week (needs break from polyuria). Methylodopa no longer available.  She had some side effects the first night which have largely resolved-the only side effect she continues to have is runny nose about 3 times a day-or at least she thinks this is a side effect-allergies certainly possibility.  She is also using 3 different eyedrops after her recent cataract surgery and has a follow-up cataract surgery for the other eye coming up.  Home #s all under 140/90 either on initial check or on repeat after 5 minutes if elevated BP Readings from Last 3 Encounters:  06/19/17 124/76. We verified home cuff today (readings systolic 056 vs 979 and diastolic 68 vs 64 on repeat- she admitted to some anxiety)  05/08/17 124/68  09/16/16 (!) 142/70  A/P: We discussed blood pressure goal of <140/90- at goal at home and on initial check here.  Home cuff verified today  Continue- amlodipine 5mg , labetalol 200mg  BID, lisinopril 40mg , hctz 12.5mg    Future Appointments  Date Time Provider [place]  09/18/2017  2:30  PM Marin Olp, MD [facility]  11/07/2017 10:00 AM Marin Olp, MD [facility]  BP recheck and later CPE  Time Stamp The duration of face-to-face time during this visit was greater than 15 minutes. Greater than 50% of this time was spent in counseling, explanation of diagnosis, planning of further management, and/or coordination of care including discussion of BP goals, discussion of home cuff vs our cuff, concerns about side effects, concerns about surgery she has had in relation to how she is feeling  Lab/Order associations: Essential hypertension  No orders  of the defined types were placed in this encounter.   Return precautions advised.  Garret Reddish, MD

## 2017-06-19 NOTE — Patient Instructions (Addendum)
Health Maintenance Due  Topic Date Due  . MAMMOGRAM - Scheduled to have it in June 05/28/2017   2-3 month follow up  Blood pressure looks good on new regimen- your cuff when we checked it seems pretty accurate and home #s look great- no change today  We will reevaluate at next visit to see if we need to make any adjustments either due to blood pressure or side effects

## 2017-06-30 DIAGNOSIS — H2512 Age-related nuclear cataract, left eye: Secondary | ICD-10-CM | POA: Diagnosis not present

## 2017-07-15 DIAGNOSIS — Z1231 Encounter for screening mammogram for malignant neoplasm of breast: Secondary | ICD-10-CM | POA: Diagnosis not present

## 2017-07-15 DIAGNOSIS — Z853 Personal history of malignant neoplasm of breast: Secondary | ICD-10-CM | POA: Diagnosis not present

## 2017-07-15 LAB — HM MAMMOGRAPHY

## 2017-07-17 ENCOUNTER — Encounter: Payer: Self-pay | Admitting: Family Medicine

## 2017-08-19 ENCOUNTER — Ambulatory Visit (INDEPENDENT_AMBULATORY_CARE_PROVIDER_SITE_OTHER): Payer: Medicare Other | Admitting: Family Medicine

## 2017-08-19 ENCOUNTER — Encounter: Payer: Self-pay | Admitting: Family Medicine

## 2017-08-19 VITALS — BP 136/68 | HR 65 | Temp 98.4°F | Ht 62.0 in | Wt 140.8 lb

## 2017-08-19 DIAGNOSIS — I10 Essential (primary) hypertension: Secondary | ICD-10-CM

## 2017-08-19 DIAGNOSIS — I701 Atherosclerosis of renal artery: Secondary | ICD-10-CM

## 2017-08-19 DIAGNOSIS — E785 Hyperlipidemia, unspecified: Secondary | ICD-10-CM

## 2017-08-19 MED ORDER — LABETALOL HCL 100 MG PO TABS: 100.0000 mg | ORAL_TABLET | Freq: Two times a day (BID) | ORAL | 3 refills | Status: DC

## 2017-08-19 MED ORDER — AMLODIPINE BESYLATE 5 MG PO TABS: 5.0000 mg | ORAL_TABLET | Freq: Two times a day (BID) | ORAL | 3 refills | Status: DC

## 2017-08-19 NOTE — Assessment & Plan Note (Signed)
S: well controlled on atorvastatin 20mg  Lab Results  Component Value Date   CHOL 125 09/16/2016   HDL 48.70 09/16/2016   LDLCALC 48 09/16/2016   LDLDIRECT 76.0 04/20/2015   TRIG 140.0 09/16/2016   CHOLHDL 3 09/16/2016   A/P: continue current rx

## 2017-08-19 NOTE — Assessment & Plan Note (Signed)
S: controlled on amlodipine 5mg , labetalol 200mg  BID, lisinopril 40mg , hctz 12.5mg  (5 days a week) today.  Methyldopa no longer available.    Home cuff verified at last visit. Home #s at 130/60 on average.   Side effect of runny nose persists since going up to 200mg  of labetalol.  BP Readings from Last 3 Encounters:  08/19/17 136/68  06/19/17 124/76  05/08/17 124/68  A/P: Continue current medicines except 1. Increase amlodipine 5mg  to twice a day 2. Reduce labetalol to 100mg  twice a day from 200mg  to see if we can reduce runny nose side effect that she started having when we increased dose of labetalol 3. She will update me in 2-3 weeks with home #s through mychart- if remains at goal and side effects better- we will stick with this new regimen

## 2017-08-19 NOTE — Patient Instructions (Addendum)
Declines wellness visit  Please check with your pharmacy to see if they have the shingrix vaccine. If they do- please get this immunization and update [place] by phone call or mychart with dates you receive the vaccine  Continue current medicines except 1. Increase amlodipine 5mg  to twice a day 2. Reduce labetalol to 100mg  twice a day from 200mg  to see if we can reduce runny nose side effect that she started having when we increased dose of labetalol 3. She will update me in 2-3 weeks with home #s through mychart- if remains at goal and side effects better- we will stick with this new regimen

## 2017-08-19 NOTE — Progress Notes (Signed)
Subjective:  Jane Cox is a 84 y.o. year old very pleasant female patient who presents for/with See problem oriented charting ROS- has runny nose through most of day since higher dose labetalol. No chest pain or shortness of breath. No headache or blurry vision.    Past Medical History-  Patient Active Problem List   Diagnosis Date Noted  . Renal artery stenosis ([place]) 12/15/2013    Priority: Medium  . Hyperlipidemia 09/10/2006    Priority: Medium  . Essential hypertension 08/18/2006    Priority: Medium  . BREAST CANCER, HX OF 08/18/2006    Priority: Medium  . Skin cancer, basal cell 04/20/2015    Priority: Low  . Lumbar degenerative disc disease 05/23/2014    Priority: Low  . Arthritis of left hip 05/23/2014    Priority: Low  . Greater trochanteric bursitis of right hip 05/23/2014    Priority: Low  . Allergic rhinitis 08/18/2006    Priority: Low  . Osteopenia 08/18/2006    Priority: Low    Medications- reviewed and updated Current Outpatient Medications  Medication Sig Dispense Refill  . acetaminophen (TYLENOL) 650 MG CR tablet Take 650 mg by mouth 2 (two) times daily. Reported on 02/27/2015    . amLODipine (NORVASC) 5 MG tablet Take 1 tablet (5 mg total) by mouth 2 (two) times daily. 180 tablet 3  . atorvastatin (LIPITOR) 20 MG tablet Take 10 mg by mouth daily.    . hydrochlorothiazide (MICROZIDE) 12.5 MG capsule Take 1 capsule (12.5 mg total) by mouth daily. 90 capsule 2  . labetalol (NORMODYNE) 100 MG tablet Take 1 tablet (100 mg total) by mouth 2 (two) times daily. 180 tablet 3  . lisinopril (PRINIVIL,ZESTRIL) 40 MG tablet TAKE 1 TABLET (40 MG TOTAL) BY MOUTH DAILY. 90 tablet 1  . Multiple Vitamin (MULTIVITAMIN) tablet Take 1 tablet by mouth daily.       No current facility-administered medications for this visit.     Objective: BP 136/68 (BP Location: Left Arm, Patient Position: Sitting, Cuff Size: Normal)   Pulse 65   Temp 98.4 F (36.9 C) (Oral)   Ht 5\' 2"   (1.575 m)   Wt 140 lb 12.8 oz (63.9 kg)   SpO2 95%   BMI 25.75 kg/m  Gen: NAD, resting comfortably CV: RRR no murmurs rubs or gallops Lungs: CTAB no crackles, wheeze, rhonchi Abdomen: soft/nontender/nondistended/normal bowel sounds. overweight Ext: no edema Skin: warm, dry Neuro: speech  normal, moves all extremities  Assessment/Plan:  Essential hypertension S: controlled on amlodipine 5mg , labetalol 200mg  BID, lisinopril 40mg , hctz 12.5mg  (5 days a week) today.  Methyldopa no longer available.    Home cuff verified at last visit. Home #s at 130/60 on average.   Side effect of runny nose persists since going up to 200mg  of labetalol.  BP Readings from Last 3 Encounters:  08/19/17 136/68  06/19/17 124/76  05/08/17 124/68  A/P: Continue current medicines except 1. Increase amlodipine 5mg  to twice a day 2. Reduce labetalol to 100mg  twice a day from 200mg  to see if we can reduce runny nose side effect that she started having when we increased dose of labetalol 3. She will update me in 2-3 weeks with home #s through mychart- if remains at goal and side effects better- we will stick with this new regimen  Hyperlipidemia S: well controlled on atorvastatin 20mg  Lab Results  Component Value Date   CHOL 125 09/16/2016   HDL 48.70 09/16/2016   LDLCALC 48 09/16/2016  LDLDIRECT 76.0 04/20/2015   TRIG 140.0 09/16/2016   CHOLHDL 3 09/16/2016   A/P: continue current rx  Future Appointments  Date Time Provider [place]  11/07/2017 10:00 AM Marin Olp, MD [facility]    Meds ordered this encounter  Medications  . amLODipine (NORVASC) 5 MG tablet    Sig: Take 1 tablet (5 mg total) by mouth 2 (two) times daily.    Dispense:  180 tablet    Refill:  3  . labetalol (NORMODYNE) 100 MG tablet    Sig: Take 1 tablet (100 mg total) by mouth 2 (two) times daily.    Dispense:  180 tablet    Refill:  3    Return precautions advised.  Garret Reddish, MD

## 2017-09-17 ENCOUNTER — Encounter: Payer: Self-pay | Admitting: Family Medicine

## 2017-09-18 ENCOUNTER — Ambulatory Visit: Payer: Medicare Other | Admitting: Family Medicine

## 2017-10-06 DIAGNOSIS — Z23 Encounter for immunization: Secondary | ICD-10-CM | POA: Diagnosis not present

## 2017-10-13 ENCOUNTER — Other Ambulatory Visit: Payer: Self-pay

## 2017-10-13 MED ORDER — ATORVASTATIN CALCIUM 20 MG PO TABS: 10.0000 mg | ORAL_TABLET | Freq: Every day | ORAL | 1 refills | Status: DC

## 2017-11-03 ENCOUNTER — Ambulatory Visit (INDEPENDENT_AMBULATORY_CARE_PROVIDER_SITE_OTHER): Payer: Medicare Other | Admitting: Family Medicine

## 2017-11-03 ENCOUNTER — Encounter: Payer: Self-pay | Admitting: Family Medicine

## 2017-11-03 VITALS — BP 136/72 | HR 74 | Temp 98.4°F | Ht 62.0 in | Wt 139.2 lb

## 2017-11-03 DIAGNOSIS — I10 Essential (primary) hypertension: Secondary | ICD-10-CM

## 2017-11-03 DIAGNOSIS — R609 Edema, unspecified: Secondary | ICD-10-CM | POA: Diagnosis not present

## 2017-11-03 DIAGNOSIS — I701 Atherosclerosis of renal artery: Secondary | ICD-10-CM

## 2017-11-03 NOTE — Progress Notes (Signed)
Subjective:  Jane Cox is a 84 y.o. year old very pleasant female patient who presents for/with See problem oriented charting ROS- edema in ankles. No chest pain or shortness of breath. No fatigue.    Past Medical History-  Patient Active Problem List   Diagnosis Date Noted  . Renal artery stenosis ([place]) 12/15/2013    Priority: Medium  . Hyperlipidemia 09/10/2006    Priority: Medium  . Essential hypertension 08/18/2006    Priority: Medium  . BREAST CANCER, HX OF 08/18/2006    Priority: Medium  . Skin cancer, basal cell 04/20/2015    Priority: Low  . Lumbar degenerative disc disease 05/23/2014    Priority: Low  . Arthritis of left hip 05/23/2014    Priority: Low  . Greater trochanteric bursitis of right hip 05/23/2014    Priority: Low  . Allergic rhinitis 08/18/2006    Priority: Low  . Osteopenia 08/18/2006    Priority: Low    Medications- reviewed and updated Current Outpatient Medications  Medication Sig Dispense Refill  . acetaminophen (TYLENOL) 650 MG CR tablet Take 650 mg by mouth 2 (two) times daily. Reported on 02/27/2015    . amLODipine (NORVASC) 5 MG tablet Take 1 tablet (5 mg total) by mouth 2 (two) times daily. 180 tablet 3  . atorvastatin (LIPITOR) 20 MG tablet Take 0.5 tablets (10 mg total) by mouth daily. 45 tablet 1  . hydrochlorothiazide (MICROZIDE) 12.5 MG capsule Take 1 capsule (12.5 mg total) by mouth daily. 90 capsule 2  . labetalol (NORMODYNE) 100 MG tablet Take 1 tablet (100 mg total) by mouth 2 (two) times daily. 180 tablet 3  . lisinopril (PRINIVIL,ZESTRIL) 40 MG tablet TAKE 1 TABLET (40 MG TOTAL) BY MOUTH DAILY. 90 tablet 1  . Multiple Vitamin (MULTIVITAMIN) tablet Take 1 tablet by mouth daily.       No current facility-administered medications for this visit.     Objective: BP 136/72 (BP Location: Left Arm, Patient Position: Sitting, Cuff Size: Large)   Pulse 74   Temp 98.4 F (36.9 C) (Oral)   Ht 5\' 2"  (1.575 m)   Wt 139 lb 3.2 oz (63.1  kg)   SpO2 96%   BMI 25.46 kg/m  Gen: NAD, resting comfortably CV: RRR no murmurs rubs or gallops Lungs: CTAB no crackles, wheeze, rhonchi Abdomen: soft/nontender/nondistended/normal bowel sounds.  Ext: 1+ pretibial edema Skin: warm, dry  Assessment/Plan:  Essential hypertension S: controlled on amlodipine 5 mg BID, labetalol 100mg  BID, lisinopril 40mg , hctz 5 days a week. home #s 124/57. Some #s into 130s.   She comes in concerned about edema in her feet/legs- we discussed amlodipine could be culprit with recent increase. She has had symptoms for about a month BP Readings from Last 3 Encounters:  11/03/17 136/72  08/19/17 136/68  06/19/17 124/76  A/P:  blood pressure goal of <140/90. Continue current meds:  But reduce amlodipine to 5 mg daily instead of BID. I am hoping this keeps her at goal given excellent home #s. Recheck planned at visit next month  Discussed tsh if swelling not better- she was planning on fasting labs at that time anyway.   Future Appointments  Date Time Provider [place]  12/11/2017 10:45 AM Marin Olp, MD [facility]   Return precautions advised.  Garret Reddish, MD

## 2017-11-03 NOTE — Patient Instructions (Signed)
Reduce amlodipine to 5 mg just in the morning- drop the evening dose.   I am hoping this helps with the swelling issue. Continue all other medicines including diuretic 5 days a week.   See you 12/11/17 for recheck. If you see home blood pressures over 145/90 consistently please let me know

## 2017-11-04 NOTE — Assessment & Plan Note (Signed)
S: controlled on amlodipine 5 mg BID, labetalol 100mg  BID, lisinopril 40mg , hctz 5 days a week. home #s 124/57. Some #s into 130s.   She comes in concerned about edema in her feet/legs- we discussed amlodipine could be culprit with recent increase. She has had symptoms for about a month BP Readings from Last 3 Encounters:  11/03/17 136/72  08/19/17 136/68  06/19/17 124/76  A/P:  blood pressure goal of <140/90. Continue current meds:  But reduce amlodipine to 5 mg daily instead of BID. I am hoping this keeps her at goal given excellent home #s. Recheck planned at visit next month  Discussed tsh if swelling not better- she was planning on fasting labs at that time anyway.

## 2017-11-07 ENCOUNTER — Ambulatory Visit: Payer: Medicare Other | Admitting: Family Medicine

## 2017-11-15 ENCOUNTER — Other Ambulatory Visit: Payer: Self-pay | Admitting: Family Medicine

## 2017-11-20 ENCOUNTER — Other Ambulatory Visit: Payer: Self-pay

## 2017-12-11 ENCOUNTER — Encounter: Payer: Self-pay | Admitting: Family Medicine

## 2017-12-11 ENCOUNTER — Ambulatory Visit (INDEPENDENT_AMBULATORY_CARE_PROVIDER_SITE_OTHER): Payer: Medicare Other | Admitting: Family Medicine

## 2017-12-11 VITALS — BP 144/68 | HR 60 | Temp 97.8°F | Ht 62.0 in | Wt 138.0 lb

## 2017-12-11 DIAGNOSIS — I701 Atherosclerosis of renal artery: Secondary | ICD-10-CM | POA: Diagnosis not present

## 2017-12-11 DIAGNOSIS — I10 Essential (primary) hypertension: Secondary | ICD-10-CM | POA: Diagnosis not present

## 2017-12-11 DIAGNOSIS — M7989 Other specified soft tissue disorders: Secondary | ICD-10-CM | POA: Diagnosis not present

## 2017-12-11 DIAGNOSIS — E785 Hyperlipidemia, unspecified: Secondary | ICD-10-CM | POA: Diagnosis not present

## 2017-12-11 LAB — LIPID PANEL
CHOL/HDL RATIO: 3
Cholesterol: 132 mg/dL (ref 0–200)
HDL: 52.7 mg/dL (ref >39.00)
LDL CALC: 54 mg/dL (ref 0–99)
NONHDL: 79.36
Triglycerides: 128 mg/dL (ref 0.0–149.0)
VLDL: 25.6 mg/dL (ref 0.0–40.0)

## 2017-12-11 LAB — COMPREHENSIVE METABOLIC PANEL
ALT: 14 U/L (ref 0–35)
AST: 14 U/L (ref 0–37)
Albumin: 4.6 g/dL (ref 3.5–5.2)
Alkaline Phosphatase: 81 U/L (ref 39–117)
BUN: 11 mg/dL (ref 6–23)
CHLORIDE: 104 meq/L (ref 96–112)
CO2: 27 meq/L (ref 19–32)
Calcium: 9.8 mg/dL (ref 8.4–10.5)
Creatinine, Ser: 0.68 mg/dL (ref 0.40–1.20)
GFR: 87.96 mL/min (ref >60.00)
GLUCOSE: 112 mg/dL — AB (ref 70–99)
Potassium: 4 mEq/L (ref 3.5–5.1)
Sodium: 138 mEq/L (ref 135–145)
Total Bilirubin: 1.3 mg/dL — ABNORMAL HIGH (ref 0.2–1.2)
Total Protein: 6.9 g/dL (ref 6.0–8.3)

## 2017-12-11 LAB — CBC
HCT: 37 % (ref 36.0–46.0)
HEMOGLOBIN: 12.3 g/dL (ref 12.0–15.0)
MCHC: 33.3 g/dL (ref 30.0–36.0)
MCV: 86.8 fl (ref 78.0–100.0)
PLATELETS: 258 10*3/uL (ref 150.0–400.0)
RBC: 4.26 Mil/uL (ref 3.87–5.11)
RDW: 13.8 % (ref 11.5–15.5)
WBC: 7.6 10*3/uL (ref 4.0–10.5)

## 2017-12-11 LAB — TSH: TSH: 1.12 u[IU]/mL (ref 0.35–4.50)

## 2017-12-11 NOTE — Patient Instructions (Addendum)
Please stop by lab before you go  If D dimer is elevated- we would get a scan on your leg to make sure no blood clot but I really think this is simply a Bakers cyst causing swelling behind knee.    I like your idea of seeing Dr. Tamala Julian if this is not a blood clot. I dont feel great about putting you on antiinflammatories with where your blood pressure runs so may be more worthwhile to consider injection

## 2017-12-11 NOTE — Assessment & Plan Note (Signed)
S:  controlled on atorvastatin 10mg  last year Lab Results  Component Value Date   CHOL 125 09/16/2016   HDL 48.70 09/16/2016   LDLCALC 48 09/16/2016   LDLDIRECT 76.0 04/20/2015   TRIG 140.0 09/16/2016   CHOLHDL 3 09/16/2016   A/P: update lipid panel. She would like to have thyroid checked to make sure not contributing

## 2017-12-11 NOTE — Assessment & Plan Note (Signed)
S: controlled on amlodipine 5 mg BID--> down to daily since edema worsened, hctz 12.5mg  daily, labetalol 100mg  BID, lisinopril 40mg - but she did not yet take her hctz today as states would not have been tolerable going out.   Home #s looking good ranging from 124-139/52-56. She stopped amlodipine 5mg  at night due to edema in legs BP Readings from Last 3 Encounters:  12/11/17 (!) 144/68  11/03/17 136/72  08/19/17 136/68  A/P: We discussed blood pressure goal of <140/90. Continue current meds:  But take hctz as soon as she gets home

## 2017-12-11 NOTE — Assessment & Plan Note (Signed)
S:  unilateral renal artery stenosis. Per cardiology should not cause high BP. Sees cardiology in the past- she was released from them Dr. Johnsie Cancel on 08/26/16 for prn follow up A/P: presumed stable- no significant reason to monitor regularly

## 2017-12-11 NOTE — Progress Notes (Signed)
Subjective:  Jane Cox is a 84 y.o. year old very pleasant female patient who presents for/with See problem oriented charting ROS- No chest pain or shortness of breath. No headache or blurry vision.    Past Medical History-  Patient Active Problem List   Diagnosis Date Noted  . Renal artery stenosis ([place]) 12/15/2013    Priority: Medium  . Hyperlipidemia 09/10/2006    Priority: Medium  . Essential hypertension 08/18/2006    Priority: Medium  . BREAST CANCER, HX OF 08/18/2006    Priority: Medium  . Skin cancer, basal cell 04/20/2015    Priority: Low  . Lumbar degenerative disc disease 05/23/2014    Priority: Low  . Arthritis of left hip 05/23/2014    Priority: Low  . Greater trochanteric bursitis of right hip 05/23/2014    Priority: Low  . Allergic rhinitis 08/18/2006    Priority: Low  . Osteopenia 08/18/2006    Priority: Low    Medications- reviewed and updated Current Outpatient Medications  Medication Sig Dispense Refill  . acetaminophen (TYLENOL) 650 MG CR tablet Take 650 mg by mouth 2 (two) times daily. Reported on 02/27/2015    . amLODipine (NORVASC) 5 MG tablet Take 1 tablet (5 mg total) by mouth 2 (two) times daily. (Patient taking differently: Take 5 mg by mouth daily. ) 180 tablet 3  . atorvastatin (LIPITOR) 20 MG tablet Take 0.5 tablets (10 mg total) by mouth daily. 45 tablet 1  . hydrochlorothiazide (MICROZIDE) 12.5 MG capsule Take 1 capsule (12.5 mg total) by mouth daily. 90 capsule 2  . labetalol (NORMODYNE) 100 MG tablet Take 1 tablet (100 mg total) by mouth 2 (two) times daily. 180 tablet 3  . lisinopril (PRINIVIL,ZESTRIL) 40 MG tablet TAKE 1 TABLET (40 MG TOTAL) BY MOUTH DAILY. 90 tablet 1  . Multiple Vitamin (MULTIVITAMIN) tablet Take 1 tablet by mouth daily.       No current facility-administered medications for this visit.     Objective: BP (!) 144/68 Comment: no diuretic yet today  Pulse 60   Temp 97.8 F (36.6 C) (Oral)   Ht 5\' 2"  (1.575 m)    Wt 138 lb (62.6 kg)   SpO2 97%   BMI 25.24 kg/m  Gen: NAD, resting comfortably CV: RRR no murmurs rubs or gallops Lungs: CTAB no crackles, wheeze, rhonchi Abdomen: soft/nontender/nondistended/normal bowel sounds.  Ext: Trace edema on left ankle, trace edema on right ankle but greater than left.  Patient does have a mild effusion on the right knee.  Appears to have Baker's cyst with bulge behind right knee as well. Skin: warm, dry  Assessment/Plan:  Right leg edema/swelling behind right knee S: right leg/knee has been more swollen for a few weeks. Stable since it started. Some pain behind the knee.  Denies history of arthritis.  Denies recent prolonged travel.  Did have a period where she had to sit at the hospital for a very long time in recent months. A/P: suspect bakers cyst but will get d-dimer to see if we can rule out blood clot without ultrasound- would have to get ultrasound if positive. She wants to see Dr. Tamala Julian if d dimer is negative.   Essential hypertension S: controlled on amlodipine 5 mg BID--> down to daily since edema worsened, hctz 12.5mg  daily, labetalol 100mg  BID, lisinopril 40mg - but she did not yet take her hctz today as states would not have been tolerable going out.   Home #s looking good ranging from 124-139/52-56. She stopped  amlodipine 5mg  at night due to edema in legs BP Readings from Last 3 Encounters:  12/11/17 (!) 144/68  11/03/17 136/72  08/19/17 136/68  A/P: We discussed blood pressure goal of <140/90. Continue current meds:  But take hctz as soon as she gets home  Hyperlipidemia S:  controlled on atorvastatin 10mg  last year Lab Results  Component Value Date   CHOL 125 09/16/2016   HDL 48.70 09/16/2016   LDLCALC 48 09/16/2016   LDLDIRECT 76.0 04/20/2015   TRIG 140.0 09/16/2016   CHOLHDL 3 09/16/2016   A/P: update lipid panel. She would like to have thyroid checked to make sure not contributing  Renal artery stenosis ([facility]) S:  unilateral renal  artery stenosis. Per cardiology should not cause high BP. Sees cardiology in the past- she was released from them Dr. Johnsie Cancel on 08/26/16 for prn follow up A/P: presumed stable- no significant reason to monitor regularly  Osteopenia S: Patient's last bone density was April 2017-did have osteopenia but mild.  We recommended vitamin D 800 units daily-she has been compliant with this- does with MV.   A/P: suspect stable- offered DEXA repeat to evaluate. She wants to wait until next year for consideration of DEXA- states wouldn't take fosamax anyway so may not be worth retesting unless a fracture.   Discussed 6 month follow up   Lab/Order associations: Right leg swelling - Plan: TSH, D-Dimer, Quantitative  Hyperlipidemia, unspecified hyperlipidemia type - Plan: CBC, Comprehensive metabolic panel, Lipid panel, TSH  Essential hypertension  Renal artery stenosis (HCC)  Return precautions advised.  Garret Reddish, MD

## 2017-12-11 NOTE — Assessment & Plan Note (Signed)
S: Patient's last bone density was April 2017-did have osteopenia but mild.  We recommended vitamin D 800 units daily-she has been compliant with this- does with MV.   A/P: suspect stable- offered DEXA repeat to evaluate. She wants to wait until next year for consideration of DEXA- states wouldn't take fosamax anyway so may not be worth retesting unless a fracture.

## 2017-12-12 LAB — D-DIMER, QUANTITATIVE: D-Dimer, Quant: 0.46 mcg/mL FEU (ref <0.50)

## 2017-12-13 ENCOUNTER — Encounter: Payer: Self-pay | Admitting: Family Medicine

## 2018-01-07 DIAGNOSIS — C44612 Basal cell carcinoma of skin of right upper limb, including shoulder: Secondary | ICD-10-CM | POA: Diagnosis not present

## 2018-01-07 DIAGNOSIS — L858 Other specified epidermal thickening: Secondary | ICD-10-CM | POA: Diagnosis not present

## 2018-01-07 DIAGNOSIS — D485 Neoplasm of uncertain behavior of skin: Secondary | ICD-10-CM | POA: Diagnosis not present

## 2018-01-07 DIAGNOSIS — Z85828 Personal history of other malignant neoplasm of skin: Secondary | ICD-10-CM | POA: Diagnosis not present

## 2018-01-12 NOTE — Progress Notes (Signed)
[facility] [name], Jane Cox [address] Phone: [phone] Subjective:    I'm seeing this patient by the request  of:  Jane Olp, MD   CC: Knee pain and swelling  BSJ:[id number]  Jane Cox is a 84 y.o. female coming in with complaint of right knee pain. Swelling. Blood pressure medication that she has been taking for about 40 years has been discontinued. Taking amlodipine that caused leg pain. Posterior baker's cyst. No antiinflammatories. Had blood work done (d dimer). When knee is most painful she "walks funny" causing pain to radiate to right hip. Skin on her leg is itchy and burning.   Onset- Chronic Location- posterior right knee  Character- moderate pain Aggravating factors- night is worse Therapies tried- ibuprofen Severity-7 out of 10   Reviewing patient's chart patient was initially seen by primary care provider.  Patient was sent and did have a d-dimer that was unremarkable.  Also reviewing the chart possible history of renal artery stenosis post most recent blood imaging was unremarkable.  Past Medical History:  Diagnosis Date  . Allergy   . Basal cell carcinoma   . HX: breast cancer   . Hyperlipidemia   . Hypertension   . Osteopenia    Past Surgical History:  Procedure Laterality Date  . ABDOMINAL HYSTERECTOMY  1983  . BREAST SURGERY     mastectomy  . CHOLECYSTECTOMY  1993  . COLONOSCOPY    . VEIN SURGERY     Social History   Socioeconomic History  . Marital status: Married    Spouse name: Not on file  . Number of children: Not on file  . Years of education: Not on file  . Highest education level: Not on file  Occupational History  . Not on file  Social Needs  . Financial resource strain: Not on file  . Food insecurity:    Worry: Not on file    Inability: Not on file  . Transportation needs:    Medical: Not on file    Non-medical: Not on file  Tobacco Use  . Smoking status: Never Smoker  .  Smokeless tobacco: Never Used  Substance and Sexual Activity  . Alcohol use: Yes    Alcohol/week: 1.0 standard drinks    Types: 1 drink(s) per week  . Drug use: No  . Sexual activity: Not on file  Lifestyle  . Physical activity:    Days per week: Not on file    Minutes per session: Not on file  . Stress: Not on file  Relationships  . Social connections:    Talks on phone: Not on file    Gets together: Not on file    Attends religious service: Not on file    Active member of club or organization: Not on file    Attends meetings of clubs or organizations: Not on file    Relationship status: Not on file  Other Topics Concern  . Not on file  Social History Narrative   Married. Husband ill ([facility] pt- prostate cancer 2009-radiation/seed implant with colitis-worsening. Colitis, CMV, bronchitis and flu in feb 2015-SNF afterwards and came home 1 month later with op PT)      1 son-bachelor (patient here). No grandchildren.       Hobbies: ladies clubs previously before being home with husband, avid reader, doesn't enjoy outdoors.       Advanced Directives: Husband HCPOA. Plans for resuscitation.    Allergies  Allergen Reactions  .  Bacitracin-Polymyxin B     REACTION: rash  . Shellfish Allergy Nausea And Vomiting  . Neosporin [Neomycin-Polymyxin-Gramicidin] Rash    Also gets blisters   Family History  Problem Relation Age of Onset  . Lung cancer Father      Current Outpatient Medications (Cardiovascular):  .  atorvastatin (LIPITOR) 20 MG tablet, Take 0.5 tablets (10 mg total) by mouth daily. .  hydrochlorothiazide (MICROZIDE) 12.5 MG capsule, Take 1 capsule (12.5 mg total) by mouth daily. Marland Kitchen  labetalol (NORMODYNE) 100 MG tablet, Take 1 tablet (100 mg total) by mouth 2 (two) times daily. Marland Kitchen  lisinopril (PRINIVIL,ZESTRIL) 40 MG tablet, TAKE 1 TABLET (40 MG TOTAL) BY MOUTH DAILY.   Current Outpatient Medications (Analgesics):  .  acetaminophen (TYLENOL) 650 MG CR tablet, Take  650 mg by mouth 2 (two) times daily. Reported on 02/27/2015   Current Outpatient Medications (Other):  Marland Kitchen  Multiple Vitamin (MULTIVITAMIN) tablet, Take 1 tablet by mouth daily.      Past medical history, social, surgical and family history all reviewed in electronic medical record.  No pertanent information unless stated regarding to the chief complaint.   Review of Systems:  No headache, visual changes, nausea, vomiting, diarrhea, constipation, dizziness, abdominal pain, skin rash, fevers, chills, night sweats, weight loss, swollen lymph nodes, body aches, joint swelling, muscle aches, chest pain, shortness of breath, mood changes.   Objective  Blood pressure (!) 150/64, pulse 78, height 5\' 2"  (1.575 m), weight 138 lb (62.6 kg), SpO2 97 %.    General: No apparent distress alert and oriented x3 mood and affect normal, dressed appropriately.  HEENT: Pupils equal, extraocular movements intact  Respiratory: Patient's speak in full sentences and does not appear short of breath  Cardiovascular: Trace lower extremity edema right greater than left, non tender, no erythema  Skin: Warm dry intact with no signs of infection or rash on extremities or on axial skeleton.  Abdomen: Soft nontender  Neuro: Cranial nerves II through XII are intact, neurovascularly intact in all extremities with 2+ DTRs and 2+ pulses.  Lymph: No lymphadenopathy of posterior or anterior cervical chain or axillae bilaterally.  Gait antalgic MSK:  Non tender with full range of motion and good stability and symmetric strength and tone of shoulders, elbows, wrist, hip, and ankles bilaterally.  Knee: Right knee valgus deformity noted.  Abnormal thigh to calf ratio.  Tender to palpation over medial and PF joint line.  Tender in the right calf as well mostly on the posterior aspect. ROM full in flexion and extension and lower leg rotation. instability with valgus force.  painful patellar compression. Patellar glide with  moderate crepitus. Patellar and quadriceps tendons unremarkable. Hamstring and quadriceps strength is normal. Contralateral knee shows mild arthritic changes  Limited musculoskeletal ultrasound was performed and interpreted by Lyndal Pulley  Limited ultrasound of the posterior aspect of the knee shows that patient likely has a Baker's cyst but appears to be ruptured at that time.  Patient has a couple different veins seem to be engorged with multiple varicose veins Impression: Baker's cyst likely ruptured but cannot rule out DVT   Impression and Recommendations:     This case required medical decision making of moderate complexity. The above documentation has been reviewed and is accurate and complete Lyndal Pulley, DO       Note: This dictation was prepared with Dragon dictation along with smaller phrase technology. Any transcriptional errors that result from this process are unintentional.

## 2018-01-13 ENCOUNTER — Encounter: Payer: Self-pay | Admitting: Family Medicine

## 2018-01-13 ENCOUNTER — Ambulatory Visit (INDEPENDENT_AMBULATORY_CARE_PROVIDER_SITE_OTHER): Payer: Medicare Other | Admitting: Family Medicine

## 2018-01-13 ENCOUNTER — Ambulatory Visit: Payer: Self-pay

## 2018-01-13 VITALS — BP 150/64 | HR 78 | Ht 62.0 in | Wt 138.0 lb

## 2018-01-13 DIAGNOSIS — G8929 Other chronic pain: Secondary | ICD-10-CM

## 2018-01-13 DIAGNOSIS — M25561 Pain in right knee: Secondary | ICD-10-CM | POA: Diagnosis not present

## 2018-01-13 DIAGNOSIS — M660 Rupture of popliteal cyst: Secondary | ICD-10-CM

## 2018-01-13 DIAGNOSIS — Z96651 Presence of right artificial knee joint: Secondary | ICD-10-CM

## 2018-01-13 DIAGNOSIS — I701 Atherosclerosis of renal artery: Secondary | ICD-10-CM

## 2018-01-13 DIAGNOSIS — M79604 Pain in right leg: Secondary | ICD-10-CM | POA: Diagnosis not present

## 2018-01-13 NOTE — Patient Instructions (Signed)
Good to see you  We will get a doppler of the leg and they will clal you  A knee compression sleeve may help  Ice 20 minutes 2 times daily. Usually after activity and before bed. pennsaid pinkie amount topically 2 times daily as needed.   I think the baker cyst may have ruptured and may take some time to get the swelling out.  See me again in 4-6 weeks

## 2018-01-13 NOTE — Assessment & Plan Note (Signed)
Patient likely has a ruptured Baker's cyst.  Will get Doppler to rule out any DVT secondary to a couple noncompressible veins redness is noticed on exam.  Patient did have varicose vein surgery likely contributed.  Patient will do more of a compression.  We discussed that we can follow-up and possible aspiration in the future.  Mild to moderate underlying arthritis could also be contributing.  Topical anti-inflammatories given.  Follow-up again in 4 to 6 weeks

## 2018-01-14 ENCOUNTER — Ambulatory Visit (HOSPITAL_COMMUNITY)
Admission: RE | Admit: 2018-01-14 | Discharge: 2018-01-14 | Disposition: A | Discharge status: Home / Self Care | Payer: Medicare Other | Source: Ambulatory Visit | Attending: Cardiovascular Disease | Admitting: Cardiovascular Disease

## 2018-01-14 DIAGNOSIS — M79604 Pain in right leg: Secondary | ICD-10-CM | POA: Diagnosis not present

## 2018-01-29 ENCOUNTER — Other Ambulatory Visit: Payer: Self-pay | Admitting: Family Medicine

## 2018-02-18 ENCOUNTER — Ambulatory Visit: Payer: Medicare Other | Admitting: Family Medicine

## 2018-02-20 DIAGNOSIS — C44612 Basal cell carcinoma of skin of right upper limb, including shoulder: Secondary | ICD-10-CM | POA: Diagnosis not present

## 2018-02-20 DIAGNOSIS — Z85828 Personal history of other malignant neoplasm of skin: Secondary | ICD-10-CM | POA: Diagnosis not present

## 2018-06-11 ENCOUNTER — Ambulatory Visit: Payer: Medicare Other | Admitting: Family Medicine

## 2018-06-12 ENCOUNTER — Other Ambulatory Visit: Payer: Self-pay | Admitting: Family Medicine

## 2018-07-09 ENCOUNTER — Other Ambulatory Visit: Payer: Self-pay | Admitting: Family Medicine

## 2018-07-10 NOTE — Telephone Encounter (Signed)
Last OV 12/11/17  Last refill 01/30/18 #90/1 Next OV not scheduled

## 2018-07-14 NOTE — Telephone Encounter (Signed)
Called and spoke with patient. She said that she is not familiar with technology and will need to call back when her son is present to schedule a virtual visit.

## 2018-07-21 ENCOUNTER — Ambulatory Visit (INDEPENDENT_AMBULATORY_CARE_PROVIDER_SITE_OTHER): Payer: Medicare Other | Admitting: Family Medicine

## 2018-07-21 ENCOUNTER — Encounter: Payer: Self-pay | Admitting: Family Medicine

## 2018-07-21 VITALS — BP 133/56 | Ht 62.0 in | Wt 140.0 lb

## 2018-07-21 DIAGNOSIS — I10 Essential (primary) hypertension: Secondary | ICD-10-CM | POA: Diagnosis not present

## 2018-07-21 DIAGNOSIS — E663 Overweight: Secondary | ICD-10-CM | POA: Diagnosis not present

## 2018-07-21 DIAGNOSIS — E785 Hyperlipidemia, unspecified: Secondary | ICD-10-CM

## 2018-07-21 NOTE — Progress Notes (Signed)
Phone [phone]   Subjective:  Virtual visit via phonenote Chief Complaint  Patient presents with  . Hypertension    This visit type was conducted due to national recommendations for restrictions regarding the COVID-19 Pandemic (e.g. social distancing).  This format is felt to be most appropriate for this patient at this time balancing risks to patient and risks to population by having him in for in person visit.  All issues noted in this document were discussed and addressed.  No physical exam was performed (except for noted visual exam or audio findings with Telehealth visits).  The patient has consented to conduct a Telehealth visit and understands insurance will be billed.   Our team/I connected with Jane Cox at  3:40 PM EDT by phone (patient did not have equipment for webex) and verified that I am speaking with the correct person using two identifiers.  Location patient: [facility] Location provider: [facility], office Persons participating in the virtual visit:  patient  Time on phone: 7 minutes Counseling provided about covid 19, blood pressure  Our team/I discussed the limitations of evaluation and management by telemedicine and the availability of in person appointments. In light of current covid-19 pandemic, patient also understands that we are trying to protect them by minimizing in office contact if at all possible.  The patient expressed consent for telemedicine visit and agreed to proceed. Patient understands insurance will be billed.   ROS- No fever, chills, cough, congestion, runny nose, shortness of breath, fatigue, body aches, sore throat, headache, nausea, vomiting, diarrhea, or new loss of taste or smell. No known contacts with covid 19 or someone being tested for covid 19.   Past Medical History-  Patient Active Problem List   Diagnosis Date Noted  . Renal artery stenosis ([place]) 12/15/2013    Priority: Medium  . Hyperlipidemia 09/10/2006    Priority: Medium   . Essential hypertension 08/18/2006    Priority: Medium  . BREAST CANCER, HX OF 08/18/2006    Priority: Medium  . Skin cancer, basal cell 04/20/2015    Priority: Low  . Lumbar degenerative disc disease 05/23/2014    Priority: Low  . Arthritis of left hip 05/23/2014    Priority: Low  . Greater trochanteric bursitis of right hip 05/23/2014    Priority: Low  . Allergic rhinitis 08/18/2006    Priority: Low  . Osteopenia 08/18/2006    Priority: Low  . Ruptured Bakers cyst 01/13/2018    Medications- reviewed and updated Current Outpatient Medications  Medication Sig Dispense Refill  . acetaminophen (TYLENOL) 650 MG CR tablet Take 650 mg by mouth 2 (two) times daily. Reported on 02/27/2015    . amLODipine (NORVASC) 5 MG tablet Take 5 mg by mouth daily.     Marland Kitchen atorvastatin (LIPITOR) 20 MG tablet TAKE 1/2 TABLET BY MOUTH DAILY 45 tablet 1  . hydrochlorothiazide (MICROZIDE) 12.5 MG capsule Take 1 capsule (12.5 mg total) by mouth daily. 90 capsule 2  . labetalol (NORMODYNE) 100 MG tablet Take 1 tablet (100 mg total) by mouth 2 (two) times daily. 180 tablet 3  . lisinopril (ZESTRIL) 40 MG tablet TAKE 1 TABLET BY MOUTH EVERY DAY 90 tablet 0  . Multiple Vitamin (MULTIVITAMIN) tablet Take 1 tablet by mouth daily.       No current facility-administered medications for this visit.      Objective:  BP (!) 133/56 Comment: per pt  Ht 5\' 2"  (1.575 m)   Wt 140 lb (63.5 kg)   BMI  25.61 kg/m  self reported vitals  Nonlabored voice, normal speech     Assessment and Plan   #hypertension S: controlled on amlodipine 5 mg, hydrochlorothiazide 12.5 mg, labetalolol 100mg  BID, lisinopril 40mg . Home readings 130s over 50s or 60s BP Readings from Last 3 Encounters:  07/21/18 (!) 133/56  01/13/18 (!) 150/64  12/11/17 (!) 144/68  A/P: Stable. Continue current medications.  - swelling from prior visit went down on its own. Had ultrasound and no bakers cyst or blood clot  #hyperlipidemia S: well  controlled on atorvastatin 20mg  with no recent issues such as myalgias Lab Results  Component Value Date   CHOL 132 12/11/2017   HDL 52.70 12/11/2017   LDLCALC 54 12/11/2017   LDLDIRECT 76.0 04/20/2015   TRIG 128.0 12/11/2017   CHOLHDL 3 12/11/2017   A/P: Stable. Continue current medications.   # overweight S:exercise has been limited as not able to exercise at the mall- trying to stay active at home.  Tries to do chicken/fish mainly for protein- and gets good mix of fruits/veggies Wt Readings from Last 3 Encounters:  07/21/18 140 lb (63.5 kg)  01/13/18 138 lb (62.6 kg)  12/11/17 138 lb (62.6 kg)  A/P: weight largely stable which is good considering covid 19- Encouraged need for healthy eating, regular exercise, weight loss.    4-6 months and update labs- can come fasting  Lab/Order associations:   ICD-10-CM   1. Essential hypertension  I10   2. Hyperlipidemia, unspecified hyperlipidemia type  E78.5   3. Overweight  E66.3    Return precautions advised.  Jane Reddish, MD

## 2018-07-21 NOTE — Patient Instructions (Signed)
Health Maintenance Due  Topic Date Due  . MAMMOGRAM  07/16/2018    Depression screen [facility] 2/9 06/19/2017 05/16/2016 04/20/2015  Decreased Interest 0 0 0  Down, Depressed, Hopeless 0 0 0  PHQ - 2 Score 0 0 0

## 2018-07-28 ENCOUNTER — Other Ambulatory Visit: Payer: Self-pay | Admitting: Family Medicine

## 2018-08-31 ENCOUNTER — Other Ambulatory Visit: Payer: Self-pay

## 2018-09-01 DIAGNOSIS — Z853 Personal history of malignant neoplasm of breast: Secondary | ICD-10-CM | POA: Diagnosis not present

## 2018-09-01 DIAGNOSIS — Z1231 Encounter for screening mammogram for malignant neoplasm of breast: Secondary | ICD-10-CM | POA: Diagnosis not present

## 2018-09-01 LAB — HM MAMMOGRAPHY

## 2018-09-03 ENCOUNTER — Other Ambulatory Visit: Payer: Self-pay | Admitting: Family Medicine

## 2018-09-04 ENCOUNTER — Encounter: Payer: Self-pay | Admitting: Family Medicine

## 2018-09-11 DIAGNOSIS — L7211 Pilar cyst: Secondary | ICD-10-CM | POA: Diagnosis not present

## 2018-09-11 DIAGNOSIS — Z85828 Personal history of other malignant neoplasm of skin: Secondary | ICD-10-CM | POA: Diagnosis not present

## 2018-09-11 DIAGNOSIS — L821 Other seborrheic keratosis: Secondary | ICD-10-CM | POA: Diagnosis not present

## 2018-09-11 DIAGNOSIS — C44319 Basal cell carcinoma of skin of other parts of face: Secondary | ICD-10-CM | POA: Diagnosis not present

## 2018-09-11 DIAGNOSIS — L438 Other lichen planus: Secondary | ICD-10-CM | POA: Diagnosis not present

## 2018-09-11 DIAGNOSIS — C44612 Basal cell carcinoma of skin of right upper limb, including shoulder: Secondary | ICD-10-CM | POA: Diagnosis not present

## 2018-09-11 DIAGNOSIS — D3612 Benign neoplasm of peripheral nerves and autonomic nervous system, upper limb, including shoulder: Secondary | ICD-10-CM | POA: Diagnosis not present

## 2018-09-11 DIAGNOSIS — D485 Neoplasm of uncertain behavior of skin: Secondary | ICD-10-CM | POA: Diagnosis not present

## 2018-09-28 ENCOUNTER — Telehealth: Payer: Self-pay | Admitting: Family Medicine

## 2018-09-28 NOTE — Telephone Encounter (Signed)
I called the patient to schedule AWV with Courtney, but there was no answer and no option to leave a message. VDM (Dee-Dee) °

## 2018-10-12 ENCOUNTER — Other Ambulatory Visit: Payer: Self-pay | Admitting: Family Medicine

## 2018-10-14 DIAGNOSIS — C44319 Basal cell carcinoma of skin of other parts of face: Secondary | ICD-10-CM | POA: Diagnosis not present

## 2018-10-14 DIAGNOSIS — Z85828 Personal history of other malignant neoplasm of skin: Secondary | ICD-10-CM | POA: Diagnosis not present

## 2018-10-27 ENCOUNTER — Ambulatory Visit: Payer: Medicare Other

## 2018-11-03 ENCOUNTER — Encounter: Payer: Self-pay | Admitting: Family Medicine

## 2018-11-03 ENCOUNTER — Other Ambulatory Visit: Payer: Self-pay

## 2018-11-03 ENCOUNTER — Ambulatory Visit (INDEPENDENT_AMBULATORY_CARE_PROVIDER_SITE_OTHER): Payer: Medicare Other

## 2018-11-03 DIAGNOSIS — Z23 Encounter for immunization: Secondary | ICD-10-CM | POA: Diagnosis not present

## 2018-11-17 ENCOUNTER — Other Ambulatory Visit: Payer: Self-pay | Admitting: Family Medicine

## 2018-12-22 ENCOUNTER — Other Ambulatory Visit: Payer: Self-pay

## 2019-01-11 ENCOUNTER — Other Ambulatory Visit: Payer: Self-pay

## 2019-01-12 ENCOUNTER — Encounter: Payer: Self-pay | Admitting: Family Medicine

## 2019-01-12 ENCOUNTER — Ambulatory Visit (INDEPENDENT_AMBULATORY_CARE_PROVIDER_SITE_OTHER): Payer: Medicare Other | Admitting: Family Medicine

## 2019-01-12 VITALS — BP 138/68 | HR 74 | Temp 98.0°F | Ht 62.0 in | Wt 140.8 lb

## 2019-01-12 DIAGNOSIS — E785 Hyperlipidemia, unspecified: Secondary | ICD-10-CM | POA: Diagnosis not present

## 2019-01-12 DIAGNOSIS — M8580 Other specified disorders of bone density and structure, unspecified site: Secondary | ICD-10-CM

## 2019-01-12 DIAGNOSIS — I10 Essential (primary) hypertension: Secondary | ICD-10-CM

## 2019-01-12 DIAGNOSIS — I701 Atherosclerosis of renal artery: Secondary | ICD-10-CM

## 2019-01-12 DIAGNOSIS — E663 Overweight: Secondary | ICD-10-CM

## 2019-01-12 MED ORDER — AMLODIPINE BESYLATE 5 MG PO TABS: 5.0000 mg | ORAL_TABLET | Freq: Every day | ORAL | 3 refills | Status: DC

## 2019-01-12 NOTE — Progress Notes (Signed)
Phone [phone] In person visit   Subjective:   Jane Cox is a 85 y.o. year old very pleasant female patient who presents for/with See problem oriented charting Chief Complaint  Patient presents with  . Follow-up  . Hypertension  . Hyperlipidemia   ROS-no fever/chills/cough/congestion reported  This visit occurred during the SARS-CoV-2 public health emergency.  Safety protocols were in place, including screening questions prior to the visit, additional usage of staff PPE, and extensive cleaning of exam room while observing appropriate contact time as indicated for disinfecting solutions.   Past Medical History-  Patient Active Problem List   Diagnosis Date Noted  . Renal artery stenosis ([place]) 12/15/2013    Priority: Medium  . Hyperlipidemia 09/10/2006    Priority: Medium  . Essential hypertension 08/18/2006    Priority: Medium  . BREAST CANCER, HX OF 08/18/2006    Priority: Medium  . Skin cancer, basal cell 04/20/2015    Priority: Low  . Lumbar degenerative disc disease 05/23/2014    Priority: Low  . Arthritis of left hip 05/23/2014    Priority: Low  . Greater trochanteric bursitis of right hip 05/23/2014    Priority: Low  . Allergic rhinitis 08/18/2006    Priority: Low  . Osteopenia 08/18/2006    Priority: Low  . Ruptured Bakers cyst 01/13/2018    Medications- reviewed and updated Current Outpatient Medications  Medication Sig Dispense Refill  . acetaminophen (TYLENOL) 650 MG CR tablet Take 650 mg by mouth 2 (two) times daily. Reported on 02/27/2015    . amLODipine (NORVASC) 5 MG tablet Take 1 tablet (5 mg total) by mouth daily. 90 tablet 3  . atorvastatin (LIPITOR) 20 MG tablet TAKE 1/2 TABLET BY MOUTH EVERY DAY 45 tablet 1  . hydrochlorothiazide (MICROZIDE) 12.5 MG capsule TAKE 1 CAPSULE BY MOUTH EVERY DAY 90 capsule 1  . labetalol (NORMODYNE) 100 MG tablet TAKE 1 TABLET BY MOUTH TWICE A DAY 180 tablet 3  . lisinopril (ZESTRIL) 40 MG tablet TAKE 1 TABLET  BY MOUTH EVERY DAY 90 tablet 1  . Multiple Vitamin (MULTIVITAMIN) tablet Take 1 tablet by mouth daily.       No current facility-administered medications for this visit.     Objective:  BP 138/68   Pulse 74   Temp 98 F (36.7 C)   Ht 5\' 2"  (1.575 m)   Wt 140 lb 12.8 oz (63.9 kg)   SpO2 97%   BMI 25.75 kg/m  Gen: NAD, resting comfortably CV: RRR no murmurs rubs or gallops Lungs: CTAB no crackles, wheeze, rhonchi Abdomen: soft/nontender/nondistended/normal bowel sounds.   Ext: no edema Skin: warm, dry     Assessment and Plan   #hypertension #Renal artery stenosis S: compliant with amlodipine 5mg , lisinopril 40mg , HCTZ 12.5mg  and labetalol 100mg . Pt states she is only taking amlodipine 1 time a day however, the RX is for BID. Pt took BP at home around 11am and it was 135/59 this morning.  Has unilateral renal artery stenosis-had angioplasty in the past stable on 06/09/2015 with no critical stenosis and renal arteries per Dr. Johnsie Cancel. Was released by cardiology BP Readings from Last 3 Encounters:  01/12/19 138/68  07/21/18 (!) 133/56  01/13/18 (!) 150/64  A/P: Hypertension well-controlled on repeat.  Renal artery stenosis likely stable-continue risk factor modification with control of blood pressure and cholesterol  #hyperlipidemia S: compliant with  atorvastatin 20mg .  Lab Results  Component Value Date   CHOL 132 12/11/2017   HDL 52.70 12/11/2017  LDLCALC 54 12/11/2017   LDLDIRECT 76.0 04/20/2015   TRIG 128.0 12/11/2017   CHOLHDL 3 12/11/2017   A/P: Hopefully controlled-update lipid panel today.  Continue current medication for now    # overweight S: Mild elevation of BMI.  Patient wishes she could walk at the mall again-and found that helpful in the past- she does try to do some stairs at home-she has to be very careful due to her husband's health.  Tries eat reasonably balanced diet.  Mainly does fish/chicken for protein.  Wt Readings from Last 3 Encounters:   01/12/19 140 lb 12.8 oz (63.9 kg)  07/21/18 140 lb (63.5 kg)  01/13/18 138 lb (62.6 kg)  A/P: congratulated patient on weight stability (only up 2 lbs with covid)  -Encouraged need for continued healthy eating, regular exercise.    #mohs surgery earlier this year for basal cell- only had to do 1 layer.   # Osteopenia- mild osteopenia note din the past- due for repeat DEXA- we opted to push back 1 year- she would prefer to have covid vaccine before getting bone density tested. Takes MV with vitamin D  Recommended follow up: Return in about 6 months (around 07/13/2019) for follow up- or sooner if needed.  Lab/Order associations:   ICD-10-CM   1. Hyperlipidemia, unspecified hyperlipidemia type  E78.5 CBC with Differential/Platelet    Comprehensive metabolic panel    Lipid panel  2. Essential hypertension  I10   3. Renal artery stenosis (HCC)  I70.1   4. Osteopenia, unspecified location  M85.80   5. Overweight  E66.3     Meds ordered this encounter  Medications  . amLODipine (NORVASC) 5 MG tablet    Sig: Take 1 tablet (5 mg total) by mouth daily.    Dispense:  90 tablet    Refill:  3   Return precautions advised.  Garret Reddish, MD

## 2019-01-12 NOTE — Patient Instructions (Addendum)
Please stop by lab before you go If you do not have mychart- we will call you about results within 5 business days of [place] receiving them.  If you have mychart- we will send your results within 3 business days of [place] receiving them.  If abnormal or we want to clarify a result, we will call or mychart you to make sure you receive the message.  If you have questions or concerns or don't hear within 5-7 days, please send [place] a message or call us.   Recommended follow up: Return in about 6 months (around 07/13/2019) for follow up- or sooner if needed. Virtual is fine with me.

## 2019-01-13 LAB — LIPID PANEL
Cholesterol: 143 mg/dL (ref 0–200)
HDL: 50.3 mg/dL (ref >39.00)
NonHDL: 92.66
Total CHOL/HDL Ratio: 3
Triglycerides: 278 mg/dL — ABNORMAL HIGH (ref 0.0–149.0)
VLDL: 55.6 mg/dL — ABNORMAL HIGH (ref 0.0–40.0)

## 2019-01-13 LAB — COMPREHENSIVE METABOLIC PANEL
ALT: 18 U/L (ref 0–35)
AST: 15 U/L (ref 0–37)
Albumin: 4.7 g/dL (ref 3.5–5.2)
Alkaline Phosphatase: 100 U/L (ref 39–117)
BUN: 12 mg/dL (ref 6–23)
CO2: 27 mEq/L (ref 19–32)
Calcium: 10.2 mg/dL (ref 8.4–10.5)
Chloride: 103 mEq/L (ref 96–112)
Creatinine, Ser: 0.74 mg/dL (ref 0.40–1.20)
GFR: 74.87 mL/min (ref >60.00)
Glucose, Bld: 124 mg/dL — ABNORMAL HIGH (ref 70–99)
Potassium: 3.9 mEq/L (ref 3.5–5.1)
Sodium: 139 mEq/L (ref 135–145)
Total Bilirubin: 0.8 mg/dL (ref 0.2–1.2)
Total Protein: 6.6 g/dL (ref 6.0–8.3)

## 2019-01-13 LAB — CBC WITH DIFFERENTIAL/PLATELET
Basophils Absolute: 0.1 10*3/uL (ref 0.0–0.1)
Basophils Relative: 0.8 % (ref 0.0–3.0)
Eosinophils Absolute: 0.1 10*3/uL (ref 0.0–0.7)
Eosinophils Relative: 1.9 % (ref 0.0–5.0)
HCT: 35.8 % — ABNORMAL LOW (ref 36.0–46.0)
Hemoglobin: 11.8 g/dL — ABNORMAL LOW (ref 12.0–15.0)
Lymphocytes Relative: 30.7 % (ref 12.0–46.0)
Lymphs Abs: 2.1 10*3/uL (ref 0.7–4.0)
MCHC: 33 g/dL (ref 30.0–36.0)
MCV: 87.8 fl (ref 78.0–100.0)
Monocytes Absolute: 0.7 10*3/uL (ref 0.1–1.0)
Monocytes Relative: 9.4 % (ref 3.0–12.0)
Neutro Abs: 3.9 10*3/uL (ref 1.4–7.7)
Neutrophils Relative %: 57.2 % (ref 43.0–77.0)
Platelets: 266 10*3/uL (ref 150.0–400.0)
RBC: 4.08 Mil/uL (ref 3.87–5.11)
RDW: 14.5 % (ref 11.5–15.5)
WBC: 6.9 10*3/uL (ref 4.0–10.5)

## 2019-01-13 LAB — LDL CHOLESTEROL, DIRECT: Direct LDL: 63 mg/dL

## 2019-02-24 ENCOUNTER — Ambulatory Visit: Payer: Medicare Other

## 2019-03-05 ENCOUNTER — Ambulatory Visit: Payer: Medicare Other | Attending: Internal Medicine

## 2019-03-05 DIAGNOSIS — Z23 Encounter for immunization: Secondary | ICD-10-CM | POA: Insufficient documentation

## 2019-03-05 NOTE — Progress Notes (Signed)
   Covid-19 Vaccination Clinic  Name:  Jane Cox    MRN: [id number] DOB: January 01, 1934  03/05/2019  [name] was observed post Covid-19 immunization for 15 minutes without incidence. She was provided with Vaccine Information Sheet and instruction to access the V-Safe system.   [name] was instructed to call 911 with any severe reactions post vaccine: Marland Kitchen Difficulty breathing  . Swelling of your face and throat  . A fast heartbeat  . A bad rash all over your body  . Dizziness and weakness    Immunizations Administered    Name Date Dose VIS Date Route   Pfizer COVID-19 Vaccine 03/05/2019  9:00 AM 0.3 mL 01/08/2019 Intramuscular   Manufacturer: [place]   Lot: [id number]   [place]: [id number]

## 2019-03-08 ENCOUNTER — Other Ambulatory Visit: Payer: Self-pay | Admitting: Family Medicine

## 2019-03-14 ENCOUNTER — Other Ambulatory Visit: Payer: Self-pay | Admitting: Family Medicine

## 2019-03-30 ENCOUNTER — Ambulatory Visit: Payer: Medicare Other | Attending: Internal Medicine

## 2019-03-30 DIAGNOSIS — Z23 Encounter for immunization: Secondary | ICD-10-CM | POA: Insufficient documentation

## 2019-03-30 NOTE — Progress Notes (Signed)
   Covid-19 Vaccination Clinic  Name:  Jane Cox    MRN: [id number] DOB: 29-Jan-1934  03/30/2019  Jane Cox was observed post Covid-19 immunization for 15 minutes without incident. She was provided with Vaccine Information Sheet and instruction to access the V-Safe system.   Jane Cox was instructed to call 911 with any severe reactions post vaccine: Marland Kitchen Difficulty breathing  . Swelling of face and throat  . A fast heartbeat  . A bad rash all over body  . Dizziness and weakness   Immunizations Administered    Name Date Dose VIS Date Route   Pfizer COVID-19 Vaccine 03/30/2019  9:33 AM 0.3 mL 01/08/2019 Intramuscular   Manufacturer: [place]   Lot: [id number]   [place]: [id number]

## 2019-04-06 ENCOUNTER — Telehealth: Payer: Self-pay | Admitting: Family Medicine

## 2019-04-06 NOTE — Telephone Encounter (Signed)
Left message for patient to call back and schedule Medicare Annual Wellness Visit (AWV) either virtually/audio only OR in office. Whatever the patients preference is.  Last AWV 3.23.17; please schedule at anytime with [facility]-Nurse Health Advisor at [facility].

## 2019-06-12 ENCOUNTER — Other Ambulatory Visit: Payer: Self-pay | Admitting: Family Medicine

## 2019-06-18 ENCOUNTER — Ambulatory Visit: Payer: Medicare Other | Admitting: Podiatry

## 2019-07-08 ENCOUNTER — Ambulatory Visit (INDEPENDENT_AMBULATORY_CARE_PROVIDER_SITE_OTHER): Payer: Medicare Other | Admitting: Podiatry

## 2019-07-08 ENCOUNTER — Other Ambulatory Visit: Payer: Self-pay

## 2019-07-08 DIAGNOSIS — M21962 Unspecified acquired deformity of left lower leg: Secondary | ICD-10-CM

## 2019-07-08 DIAGNOSIS — M21961 Unspecified acquired deformity of right lower leg: Secondary | ICD-10-CM | POA: Diagnosis not present

## 2019-07-08 NOTE — Progress Notes (Signed)
  Subjective:  Patient ID: Jane Cox, female    DOB: 1933/09/27,  MRN: [id number]  Chief Complaint  Patient presents with  . Callouses    Bilateral plantar forefoot painful callouses 50mo duration    86 y.o. female presents with the above complaint. History confirmed with patient.   Objective:  Physical Exam: warm, good capillary refill, no trophic changes or ulcerative lesions, normal DP and PT pulses and normal sensory exam. Left Foot: HPK submet 2  Right Foot: HPK submet 1   Assessment:   1. Metatarsal deformity, left   2. Metatarsal deformity, right      Plan:  Patient was evaluated and treated and all questions answered.  Metatarsal deformities with deep hyperkeratoses -Educated on etiology -Courtesy pared lesions.  No follow-ups on file.

## 2019-07-13 ENCOUNTER — Encounter: Payer: Self-pay | Admitting: Family Medicine

## 2019-07-13 ENCOUNTER — Ambulatory Visit (INDEPENDENT_AMBULATORY_CARE_PROVIDER_SITE_OTHER): Payer: Medicare Other | Admitting: Family Medicine

## 2019-07-13 ENCOUNTER — Other Ambulatory Visit: Payer: Self-pay

## 2019-07-13 VITALS — BP 144/60 | HR 69 | Temp 97.6°F | Ht 62.0 in | Wt 142.4 lb

## 2019-07-13 DIAGNOSIS — E785 Hyperlipidemia, unspecified: Secondary | ICD-10-CM | POA: Diagnosis not present

## 2019-07-13 DIAGNOSIS — I701 Atherosclerosis of renal artery: Secondary | ICD-10-CM

## 2019-07-13 DIAGNOSIS — M85852 Other specified disorders of bone density and structure, left thigh: Secondary | ICD-10-CM | POA: Diagnosis not present

## 2019-07-13 DIAGNOSIS — I10 Essential (primary) hypertension: Secondary | ICD-10-CM | POA: Diagnosis not present

## 2019-07-13 NOTE — Patient Instructions (Addendum)
Please stop by lab before you go If you have mychart- we will send your results within 3 business days of [place] receiving them.  If you do not have mychart- we will call you about results within 5 business days of [place] receiving them.    Schedule your bone density test at check out desk. You may also call directly to X-ray at [phone] to schedule an appointment that is convenient for you.  - located [address] across the street from [place] - in the basement - you do need an appointment for the bone density tests.   No change stoday unless labs lead [place] to make changes.

## 2019-07-13 NOTE — Progress Notes (Signed)
Phone [phone] In person visit   Subjective:   Jane Cox is a 86 y.o. year old very pleasant female patient who presents for/with See problem oriented charting Chief Complaint  Patient presents with  . Follow-up  . Hypertension  . Hyperlipidemia   This visit occurred during the SARS-CoV-2 public health emergency.  Safety protocols were in place, including screening questions prior to the visit, additional usage of staff PPE, and extensive cleaning of exam room while observing appropriate contact time as indicated for disinfecting solutions.   Past Medical History-  Patient Active Problem List   Diagnosis Date Noted  . Renal artery stenosis ([place]) 12/15/2013    Priority: Medium  . Hyperlipidemia 09/10/2006    Priority: Medium  . Essential hypertension 08/18/2006    Priority: Medium  . BREAST CANCER, HX OF 08/18/2006    Priority: Medium  . Skin cancer, basal cell 04/20/2015    Priority: Low  . Lumbar degenerative disc disease 05/23/2014    Priority: Low  . Arthritis of left hip 05/23/2014    Priority: Low  . Greater trochanteric bursitis of right hip 05/23/2014    Priority: Low  . Allergic rhinitis 08/18/2006    Priority: Low  . Osteopenia 08/18/2006    Priority: Low  . Ruptured Bakers cyst 01/13/2018    Medications- reviewed and updated Current Outpatient Medications  Medication Sig Dispense Refill  . acetaminophen (TYLENOL) 650 MG CR tablet Take 650 mg by mouth 2 (two) times daily. Reported on 02/27/2015    . amLODipine (NORVASC) 5 MG tablet Take 1 tablet (5 mg total) by mouth daily. 90 tablet 3  . atorvastatin (LIPITOR) 20 MG tablet TAKE 1/2 TABLET BY MOUTH EVERY DAY 45 tablet 1  . hydrochlorothiazide (MICROZIDE) 12.5 MG capsule TAKE 1 CAPSULE BY MOUTH EVERY DAY 90 capsule 1  . labetalol (NORMODYNE) 100 MG tablet TAKE 1 TABLET BY MOUTH TWICE A DAY 180 tablet 3  . lisinopril (ZESTRIL) 40 MG tablet TAKE 1 TABLET BY MOUTH EVERY DAY 90 tablet 1  . Multiple  Vitamin (MULTIVITAMIN) tablet Take 1 tablet by mouth daily.       No current facility-administered medications for this visit.     Objective:  BP (!) 144/60   Pulse 69   Temp 97.6 F (36.4 C)   Ht 5\' 2"  (1.575 m)   Wt 142 lb 6.4 oz (64.6 kg)   SpO2 96%   BMI 26.05 kg/m  Gen: NAD, resting comfortably CV: RRR no murmurs rubs or gallops- has been told 2/6 SEM in the past- I do not hear totday  Lungs: CTAB no crackles, wheeze, rhonchi Ext: no edema Skin: warm, dry    Assessment and Plan   #hyperlipidemia S: Medication: atorvastatin 20Mg  tablet-only takes half tablets of 10 mg Lab Results  Component Value Date   CHOL 143 01/12/2019   HDL 50.30 01/12/2019   LDLCALC 54 12/11/2017   LDLDIRECT 63.0 01/12/2019   TRIG 278.0 (H) 01/12/2019   CHOLHDL 3 01/12/2019   A/P: Excellent control last check with LDL under 70-continue current medication  Osteopenia S: 05/10/2015 with bone density with worst T score of -1.1.  Patient takes MV with some calcium and some vitamin D.  She is not initiating medication like Fosamax -not walking recently due to bad callous on both feet that had removed recently A/P: hopefully not worsening control- will update bone density   #hypertension with whitecoat element S: medication: Lisinopril 40Mg , labetalol 100Mg  twice daily, hctz 12.5Mg , amlodipine 5mg   Home readings #s: Home average 132/52 including last 5 readings from home. BP Readings from Last 3 Encounters:  07/13/19 (!) 144/60  01/12/19 138/68  07/21/18 (!) 133/56  A/P: Excellent control at home-we will rely on these numbers due to whitecoat hypertension in office.  Continue current medications and  #Unilateral renal artery stenosis S: Patient with history of angioplasty in the past.  Last renal ultrasound 06/09/2015 "1 to 59% right renal artery stenosis status post remote PTA" with normal left renal artery.  Cardiology has stated unilateral stenosis should not cause blood pressure issues A/P:  Suspect stable-continue to modify risk factors by controlling blood pressure and cholesterol.  Discussed possibly reimaging and decided to hold off for now  Recommended follow up:  6 months  Lab/Order associations:   ICD-10-CM   1. Essential hypertension  I10 CBC with Differential/Platelet    Comprehensive metabolic panel  2. Osteopenia of neck of left femur  M85.852 DG Bone Density  3. Hyperlipidemia, unspecified hyperlipidemia type  E78.5   4. Renal artery stenosis (HCC) Chronic I70.1    Return precautions advised.  Garret Reddish, MD

## 2019-07-14 LAB — CBC WITH DIFFERENTIAL/PLATELET
Basophils Absolute: 0.1 10*3/uL (ref 0.0–0.1)
Basophils Relative: 1 % (ref 0.0–3.0)
Eosinophils Absolute: 0.1 10*3/uL (ref 0.0–0.7)
Eosinophils Relative: 1.8 % (ref 0.0–5.0)
HCT: 35.2 % — ABNORMAL LOW (ref 36.0–46.0)
Hemoglobin: 11.8 g/dL — ABNORMAL LOW (ref 12.0–15.0)
Lymphocytes Relative: 29.7 % (ref 12.0–46.0)
Lymphs Abs: 2 10*3/uL (ref 0.7–4.0)
MCHC: 33.4 g/dL (ref 30.0–36.0)
MCV: 87.4 fl (ref 78.0–100.0)
Monocytes Absolute: 0.6 10*3/uL (ref 0.1–1.0)
Monocytes Relative: 9.5 % (ref 3.0–12.0)
Neutro Abs: 3.9 10*3/uL (ref 1.4–7.7)
Neutrophils Relative %: 58 % (ref 43.0–77.0)
Platelets: 247 10*3/uL (ref 150.0–400.0)
RBC: 4.03 Mil/uL (ref 3.87–5.11)
RDW: 14 % (ref 11.5–15.5)
WBC: 6.7 10*3/uL (ref 4.0–10.5)

## 2019-07-14 LAB — COMPREHENSIVE METABOLIC PANEL
ALT: 17 U/L (ref 0–35)
AST: 14 U/L (ref 0–37)
Albumin: 4.5 g/dL (ref 3.5–5.2)
Alkaline Phosphatase: 99 U/L (ref 39–117)
BUN: 15 mg/dL (ref 6–23)
CO2: 26 mEq/L (ref 19–32)
Calcium: 9.7 mg/dL (ref 8.4–10.5)
Chloride: 105 mEq/L (ref 96–112)
Creatinine, Ser: 0.68 mg/dL (ref 0.40–1.20)
GFR: 82.44 mL/min (ref >60.00)
Glucose, Bld: 151 mg/dL — ABNORMAL HIGH (ref 70–99)
Potassium: 3.7 mEq/L (ref 3.5–5.1)
Sodium: 139 mEq/L (ref 135–145)
Total Bilirubin: 0.7 mg/dL (ref 0.2–1.2)
Total Protein: 6.4 g/dL (ref 6.0–8.3)

## 2019-07-19 ENCOUNTER — Inpatient Hospital Stay: Admission: RE | Admit: 2019-07-19 | Payer: Medicare Other | Source: Ambulatory Visit

## 2019-07-20 ENCOUNTER — Inpatient Hospital Stay: Admission: RE | Admit: 2019-07-20 | Payer: Medicare Other | Source: Ambulatory Visit

## 2019-07-22 ENCOUNTER — Ambulatory Visit (INDEPENDENT_AMBULATORY_CARE_PROVIDER_SITE_OTHER)
Admission: RE | Admit: 2019-07-22 | Discharge: 2019-07-22 | Disposition: A | Discharge status: Home / Self Care | Payer: Medicare Other | Source: Ambulatory Visit | Attending: Family Medicine | Admitting: Family Medicine

## 2019-07-22 ENCOUNTER — Other Ambulatory Visit: Payer: Self-pay

## 2019-07-22 DIAGNOSIS — M85852 Other specified disorders of bone density and structure, left thigh: Secondary | ICD-10-CM

## 2019-08-10 ENCOUNTER — Other Ambulatory Visit: Payer: Self-pay | Admitting: Family Medicine

## 2019-08-23 ENCOUNTER — Telehealth: Payer: Self-pay | Admitting: Family Medicine

## 2019-08-23 NOTE — Progress Notes (Signed)
  Chronic Care Management   Outreach Note  08/23/2019 Name: Jane Cox MRN: [id number] DOB: [birth year]  Referred by: Marin Olp, MD Reason for referral : No chief complaint on file.   An unsuccessful telephone outreach was attempted today. The patient was referred to the pharmacist for assistance with care management and care coordination.   Follow Up Plan:   Carley Perdue [employer] Scheduler

## 2019-09-03 ENCOUNTER — Encounter: Payer: Self-pay | Admitting: Family Medicine

## 2019-09-16 ENCOUNTER — Telehealth: Payer: Self-pay | Admitting: Family Medicine

## 2019-09-16 DIAGNOSIS — L821 Other seborrheic keratosis: Secondary | ICD-10-CM | POA: Diagnosis not present

## 2019-09-16 DIAGNOSIS — L820 Inflamed seborrheic keratosis: Secondary | ICD-10-CM | POA: Diagnosis not present

## 2019-09-16 DIAGNOSIS — Z85828 Personal history of other malignant neoplasm of skin: Secondary | ICD-10-CM | POA: Diagnosis not present

## 2019-09-16 DIAGNOSIS — D692 Other nonthrombocytopenic purpura: Secondary | ICD-10-CM | POA: Diagnosis not present

## 2019-09-16 DIAGNOSIS — D225 Melanocytic nevi of trunk: Secondary | ICD-10-CM | POA: Diagnosis not present

## 2019-09-16 DIAGNOSIS — D1801 Hemangioma of skin and subcutaneous tissue: Secondary | ICD-10-CM | POA: Diagnosis not present

## 2019-09-16 DIAGNOSIS — L7211 Pilar cyst: Secondary | ICD-10-CM | POA: Diagnosis not present

## 2019-09-16 DIAGNOSIS — L570 Actinic keratosis: Secondary | ICD-10-CM | POA: Diagnosis not present

## 2019-09-16 DIAGNOSIS — D2262 Melanocytic nevi of left upper limb, including shoulder: Secondary | ICD-10-CM | POA: Diagnosis not present

## 2019-09-16 DIAGNOSIS — D485 Neoplasm of uncertain behavior of skin: Secondary | ICD-10-CM | POA: Diagnosis not present

## 2019-09-16 DIAGNOSIS — L858 Other specified epidermal thickening: Secondary | ICD-10-CM | POA: Diagnosis not present

## 2019-09-16 DIAGNOSIS — D2271 Melanocytic nevi of right lower limb, including hip: Secondary | ICD-10-CM | POA: Diagnosis not present

## 2019-09-16 NOTE — Progress Notes (Signed)
°  Chronic Care Management   Outreach Note  09/16/2019 Name: Jane Cox MRN: [id number] DOB: April 13, 1933  Referred by: Marin Olp, MD Reason for referral : No chief complaint on file.   An unsuccessful telephone outreach was attempted today. The patient was referred to the pharmacist for assistance with care management and care coordination.   Follow Up Plan:   Earney [place] [employer] Scheduler

## 2019-09-28 ENCOUNTER — Telehealth: Payer: Self-pay | Admitting: Family Medicine

## 2019-09-28 NOTE — Progress Notes (Signed)
  Chronic Care Management   Outreach Note  09/28/2019 Name: Jane Cox MRN: [id number] DOB: 07-Jan-1934  Referred by: Marin Olp, MD Reason for referral : No chief complaint on file.   An unsuccessful telephone outreach was attempted today. The patient was referred to the pharmacist for assistance with care management and care coordination.   Follow Up Plan:   Earney [place] [employer] Scheduler

## 2019-10-13 ENCOUNTER — Other Ambulatory Visit: Payer: Self-pay | Admitting: Family Medicine

## 2019-10-17 ENCOUNTER — Other Ambulatory Visit: Payer: Self-pay | Admitting: Family Medicine

## 2019-11-02 DIAGNOSIS — Z23 Encounter for immunization: Secondary | ICD-10-CM | POA: Diagnosis not present

## 2019-11-23 ENCOUNTER — Other Ambulatory Visit: Payer: Self-pay

## 2019-11-23 ENCOUNTER — Other Ambulatory Visit (HOSPITAL_BASED_OUTPATIENT_CLINIC_OR_DEPARTMENT_OTHER): Payer: Self-pay | Admitting: Internal Medicine

## 2019-11-23 ENCOUNTER — Ambulatory Visit: Payer: Medicare Other | Attending: Internal Medicine

## 2019-11-23 DIAGNOSIS — Z23 Encounter for immunization: Secondary | ICD-10-CM

## 2019-11-23 NOTE — Progress Notes (Signed)
   Covid-19 Vaccination Clinic  Name:  YARROW LINHART    MRN: [id number] DOB: [birth year]  11/23/2019  Ms. Winget was observed post Covid-19 immunization for 15 minutes without incident. She was provided with Vaccine Information Sheet and instruction to access the V-Safe system. Vaccinated by [employer].  Ms. Maxim was instructed to call 911 with any severe reactions post vaccine: Marland Kitchen Difficulty breathing  . Swelling of face and throat  . A fast heartbeat  . A bad rash all over body  . Dizziness and weakness

## 2019-11-30 MED FILL — PFIZER-BIONTECH COVID-19 VA: 30 | 1 days supply | Qty: 0 | Fill #0

## 2019-12-12 ENCOUNTER — Other Ambulatory Visit: Payer: Self-pay

## 2019-12-12 ENCOUNTER — Emergency Department (HOSPITAL_COMMUNITY)
Admission: EM | Admit: 2019-12-12 | Discharge: 2019-12-12 | Disposition: A | Discharge status: Home / Self Care | Payer: Medicare Other | Attending: Emergency Medicine | Admitting: Emergency Medicine

## 2019-12-12 DIAGNOSIS — I10 Essential (primary) hypertension: Secondary | ICD-10-CM | POA: Insufficient documentation

## 2019-12-12 DIAGNOSIS — Z853 Personal history of malignant neoplasm of breast: Secondary | ICD-10-CM | POA: Insufficient documentation

## 2019-12-12 DIAGNOSIS — Z85828 Personal history of other malignant neoplasm of skin: Secondary | ICD-10-CM | POA: Insufficient documentation

## 2019-12-12 DIAGNOSIS — Z79899 Other long term (current) drug therapy: Secondary | ICD-10-CM | POA: Diagnosis not present

## 2019-12-12 DIAGNOSIS — W268XXA Contact with other sharp object(s), not elsewhere classified, initial encounter: Secondary | ICD-10-CM | POA: Diagnosis not present

## 2019-12-12 DIAGNOSIS — S61216A Laceration without foreign body of right little finger without damage to nail, initial encounter: Secondary | ICD-10-CM | POA: Diagnosis not present

## 2019-12-12 DIAGNOSIS — S61316A Laceration without foreign body of right little finger with damage to nail, initial encounter: Secondary | ICD-10-CM

## 2019-12-12 MED ORDER — CEPHALEXIN 500 MG PO CAPS
500.0000 mg | ORAL_CAPSULE | Freq: Three times a day (TID) | ORAL | 0 refills | Status: DC
Start: 2019-12-12 — End: 2019-12-12

## 2019-12-12 MED ORDER — CEPHALEXIN 500 MG PO CAPS
500.0000 mg | ORAL_CAPSULE | Freq: Three times a day (TID) | ORAL | 0 refills | Status: DC
Start: 2019-12-12 — End: 2020-01-13

## 2019-12-12 MED ORDER — LIDOCAINE HCL 2 % IJ SOLN
INTRAMUSCULAR | Status: AC
  Administered 2019-12-12: 400 mg via INTRADERMAL
  Filled 2019-12-12: qty 20

## 2019-12-12 MED ORDER — LIDOCAINE HCL 2 % IJ SOLN: 20.0000 mL | Freq: Once | INTRAMUSCULAR | Status: AC

## 2019-12-12 NOTE — ED Provider Notes (Signed)
[facility] DEPT Provider Note   CSN: [id number] Arrival date & time: 12/12/19  1447     History Chief Complaint  Patient presents with  . Laceration    Jane Cox is a 86 y.o. female.  Patient with history of hypertension presents emergency department for evaluation of right little finger laceration.  Patient was out shopping when she cut her finger accidentally on a metal toilet paper holder.  She was evaluated by staff at the shopping center and the finger was wrapped.  She is up-to-date on her tetanus.  No anticoagulants.  No other injuries reported.  Patient otherwise feels well but reports pain in her finger.  She is able to move the finger without difficulty.        Past Medical History:  Diagnosis Date  . Allergy   . Basal cell carcinoma   . HX: breast cancer   . Hyperlipidemia   . Hypertension   . Osteopenia     Patient Active Problem List   Diagnosis Date Noted  . Ruptured Bakers cyst 01/13/2018  . Skin cancer, basal cell 04/20/2015  . Lumbar degenerative disc disease 05/23/2014  . Arthritis of left hip 05/23/2014  . Greater trochanteric bursitis of right hip 05/23/2014  . Renal artery stenosis ([place]) 12/15/2013  . Hyperlipidemia 09/10/2006  . Essential hypertension 08/18/2006  . Allergic rhinitis 08/18/2006  . Osteopenia 08/18/2006  . BREAST CANCER, HX OF 08/18/2006    Past Surgical History:  Procedure Laterality Date  . ABDOMINAL HYSTERECTOMY  1983  . BREAST SURGERY     mastectomy  . CHOLECYSTECTOMY  1993  . COLONOSCOPY    . VEIN SURGERY       OB History   No obstetric history on file.     Family History  Problem Relation Age of Onset  . Lung cancer Father     Social History   Tobacco Use  . Smoking status: Never Smoker  . Smokeless tobacco: Never Used  Substance Use Topics  . Alcohol use: Yes    Alcohol/week: 1.0 standard drink    Types: 1 drink(s) per week  . Drug use: No    Home  Medications Prior to Admission medications   Medication Sig Start Date End Date Taking? Authorizing Provider  acetaminophen (TYLENOL) 650 MG CR tablet Take 650 mg by mouth 2 (two) times daily. Reported on 02/27/2015    [provider]  amLODipine (NORVASC) 5 MG tablet TAKE 1 TABLET BY MOUTH TWICE A DAY 10/13/19   Marin Olp, MD  atorvastatin (LIPITOR) 20 MG tablet TAKE 1/2 TABLET BY MOUTH EVERY DAY 10/18/19   Marin Olp, MD  cephALEXin (KEFLEX) 500 MG capsule Take 1 capsule (500 mg total) by mouth 3 (three) times daily. 12/12/19   Carlisle Cater, PA-C  hydrochlorothiazide (MICROZIDE) 12.5 MG capsule TAKE 1 CAPSULE BY MOUTH EVERY DAY 06/14/19   Marin Olp, MD  labetalol (NORMODYNE) 100 MG tablet TAKE 1 TABLET BY MOUTH TWICE A DAY 08/10/19   Marin Olp, MD  lisinopril (ZESTRIL) 40 MG tablet TAKE 1 TABLET BY MOUTH EVERY DAY 10/18/19   Marin Olp, MD  Multiple Vitamin (MULTIVITAMIN) tablet Take 1 tablet by mouth daily.      [provider]    Allergies    Bacitracin-polymyxin b, Shellfish allergy, and Neosporin [neomycin-polymyxin-gramicidin]  Review of Systems   Review of Systems  Musculoskeletal: Positive for myalgias. Negative for arthralgias and joint swelling.  Skin: Positive for wound.  Neurological: Negative for weakness and numbness.    Physical Exam Updated Vital Signs BP (!) 207/66 (BP Location: Left Arm) Comment: made nurse aware of b/p  Pulse 74   Temp 98.2 F (36.8 C) (Oral)   Resp 16   SpO2 95%   Physical Exam Vitals and nursing note reviewed.  Constitutional:      Appearance: She is well-developed.  HENT:     Head: Normocephalic and atraumatic.  Eyes:     Conjunctiva/sclera: Conjunctivae normal.  Pulmonary:     Effort: No respiratory distress.  Musculoskeletal:     Cervical back: Normal range of motion and neck supple.  Skin:    General: Skin is warm and dry.     Comments: Right little finger: Patient with an  approximately 4 cm total length laceration.  There is about 1/4 inch of the nail which has been sliced and removed from the nail plate distally with exposed nailbed.  The laceration extends to the distal most part of the nailbed ulnarly and extends radially.  There is a laceration through the paronychial fold ulnarly which then extends to the radial aspect of the digit, distal to the DIP joint.  The wound is irregular.  Proximally there is a segment that is a shallow flap.  There is a deeper laceration extends distally.  Mild oozing.  No foreign body seen.  No visualized bone.  Neurological:     Mental Status: She is alert.     ED Results / Procedures / Treatments   Labs (all labs ordered are listed, but only abnormal results are displayed) Labs Reviewed - No data to display  EKG None  Radiology No results found.  Procedures .Marland KitchenLaceration Repair  Date/Time: 12/12/2019 4:40 PM Performed by: Carlisle Cater, PA-C Authorized by: Carlisle Cater, PA-C   Consent:    Consent obtained:  Verbal   Consent given by:  Patient   Risks discussed:  Infection, pain, nerve damage, need for additional repair, poor cosmetic result and poor wound healing   Alternatives discussed:  No treatment Anesthesia (see MAR for exact dosages):    Anesthesia method:  Nerve block   Block needle gauge:  25 G   Block anesthetic:  Lidocaine 2% w/o epi   Block technique:  3 sided ring block   Block injection procedure:  Anatomic landmarks identified, introduced needle, incremental injection, negative aspiration for blood and anatomic landmarks palpated   Block outcome:  Anesthesia achieved (Complete anesthesia achieved) Laceration details:    Location:  Finger   Finger location:  R small finger   Length (cm):  4 Repair type:    Repair type:  Intermediate Pre-procedure details:    Preparation:  Patient was prepped and draped in usual sterile fashion Exploration:    Hemostasis achieved with:  Direct pressure and  tourniquet (Medium finger tourniquet used, total time 25 minutes)   Wound exploration: wound explored through full range of motion and entire depth of wound probed and visualized     Wound extent: no foreign bodies/material noted     Contaminated: no   Treatment:    Amount of cleaning:  Extensive   Irrigation solution:  Sterile saline   Irrigation volume:  1000   Irrigation method:  Pressure wash   Visualized foreign bodies/material removed: no   Skin repair:    Repair method:  Sutures and tissue adhesive   Suture size:  5-0   Suture material:  Nylon   Suture technique:  Simple interrupted   Number  of sutures:  8 Approximation:    Approximation:  Close Post-procedure details:    Dressing:  Open (no dressing)   Patient tolerance of procedure:  Tolerated well, no immediate complications Comments:     This was a delicate wound repair.  The shallow skin flap was repaired with Dermabond.  The partial nailbed laceration was stabilized distally as best as possible with 3 sutures through the skin.  2 of the sutures I placed through the remaining nail in order to get good anchoring.  There was limited amount of tissue to work with distally.   (including critical care time)  Medications Ordered in ED Medications  lidocaine (XYLOCAINE) 2 % (with pres) injection 400 mg (400 mg Intradermal Given 12/12/19 1638)    ED Course  I have reviewed the triage vital signs and the nursing notes.  Pertinent labs & imaging results that were available during my care of the patient were reviewed by me and considered in my medical decision making (see chart for details).  Patient seen and examined.  Patient with complex laceration of the fingertip involving the distal aspect of the nailbed itself.  Discussed procedure of digital block in wound repair with patient patient and she agrees to proceed.  Vital signs reviewed and are as follows: BP (!) 189/68   Pulse 67   Temp 98.2 F (36.8 C) (Oral)   Resp  15   SpO2 96%   Patient counseled on wound care.  Given that this is her finger, patient will be placed on 5 days of Keflex as prophylaxis.  She will be given a splint for protection.  I would like her to have the wound rechecked by her doctor later in the week to ensure that it is appropriately healing.  She will need suture removal in 10 to 14 days depending upon how the wound looks.  Patient was urged to return to the Emergency Department urgently with worsening pain, swelling, expanding erythema especially if it streaks away from the affected area, fever, or if they have any other concerns. Patient verbalized understanding.      MDM Rules/Calculators/A&P                          Patient with complex finger laceration, doubt open fracture.  Wound cleaned and repaired as best as possible given the complex nature of the injury.  Follow-up plan as above.  Patient ready for discharge.  Final Clinical Impression(s) / ED Diagnoses Final diagnoses:  Laceration of right little finger without foreign body with damage to nail, initial encounter    Rx / DC Orders ED Discharge Orders         Ordered    cephALEXin (KEFLEX) 500 MG capsule  3 times daily,   Status:  Discontinued        12/12/19 1632    cephALEXin (KEFLEX) 500 MG capsule  3 times daily        12/12/19 1633           Carlisle Cater, PA-C 12/12/19 1644    Drenda Freeze, MD 12/12/19 [phone]

## 2019-12-12 NOTE — ED Triage Notes (Signed)
Patient reports to the ER for finger laceration. Patient reports she was at [facility] and gut her finger on the toilet paper holder. Patient reports she is up to date on her tetanus.

## 2019-12-12 NOTE — Discharge Instructions (Signed)
Please read and follow all provided instructions.  Your diagnoses today include:  1. Laceration of right little finger without foreign body with damage to nail, initial encounter     Tests performed today include:  Vital signs. See below for your results today.   Medications prescribed:   Keflex (cephalexin) - antibiotic  You have been prescribed an antibiotic medicine: take the entire course of medicine even if you are feeling better. Stopping early can cause the antibiotic not to work.  Take any prescribed medications only as directed.   Home care instructions:  Follow any educational materials and wound care instructions contained in this packet.   Keep affected area above the level of your heart when possible to minimize swelling. Wash area gently twice a day with warm soapy water. Do not apply alcohol or hydrogen peroxide. Cover the area if it draining or weeping.   Follow-up instructions: Suture Removal: Return to the Emergency Department or see your primary care care doctor in 3-4 days for a recheck of your wound and in 10-14 days for ultimate removal of your sutures.    Return instructions:  Return to the Emergency Department if you have:  Fever  Worsening pain  Worsening swelling of the wound  Pus draining from the wound  Redness of the skin that moves away from the wound, especially if it streaks away from the affected area   Any other emergent concerns  Your vital signs today were: BP (!) 207/66 (BP Location: Left Arm) Comment: made nurse aware of b/p  Pulse 74   Temp 98.2 F (36.8 C) (Oral)   Resp 16   SpO2 95%  If your blood pressure (BP) was elevated above 135/85 this visit, please have this repeated by your doctor within one month. --------------

## 2019-12-12 NOTE — ED Notes (Signed)
PA made aware of d/c blood pressure.

## 2019-12-16 ENCOUNTER — Ambulatory Visit (INDEPENDENT_AMBULATORY_CARE_PROVIDER_SITE_OTHER): Payer: Medicare Other | Admitting: Family Medicine

## 2019-12-16 ENCOUNTER — Other Ambulatory Visit: Payer: Self-pay

## 2019-12-16 VITALS — BP 161/61 | HR 58 | Temp 98.0°F | Ht 62.0 in | Wt 138.4 lb

## 2019-12-16 DIAGNOSIS — S61216A Laceration without foreign body of right little finger without damage to nail, initial encounter: Secondary | ICD-10-CM | POA: Diagnosis not present

## 2019-12-16 DIAGNOSIS — M79644 Pain in right finger(s): Secondary | ICD-10-CM | POA: Diagnosis not present

## 2019-12-16 NOTE — Patient Instructions (Signed)
It was very nice to see you today!  Your finger looks like it is healing normally.  Please let us know if you have worsening pain, redness, or discharge.  You should have it removed 10 to 14 days after was placed.  We can do this here.  Take care, Dr Jerline Pain  Please try these tips to maintain a healthy lifestyle:   Eat at least 3 REAL meals and 1-2 snacks per day.  Aim for no more than 5 hours between eating.  If you eat breakfast, please do so within one hour of getting up.    Each meal should contain half fruits/vegetables, one quarter protein, and one quarter carbs (no bigger than a computer mouse)   Cut down on sweet beverages. This includes juice, soda, and sweet tea.     Drink at least 1 glass of water with each meal and aim for at least 8 glasses per day   Exercise at least 150 minutes every week.

## 2019-12-16 NOTE — Progress Notes (Signed)
   Jane Cox is a 86 y.o. female who presents today for an office visit.  Assessment/Plan:  New/Acute Problems: Finger Pain / Laceration No red flags.  Seems to be healing normally.  She will need suture removal in 10 to 14 days.  She can come back here for this or she can go to urgent care or emergency department.  She can continue OTC meds as needed for pain control.  Discussed reasons to return to care.    Subjective:  HPI:  Patient lacerated right fifth digit 4 days ago in public bathroom had pain develop.  Went to the ED.  Had repair with 8 sutures and Dermabond.  She is here today for follow-up.  Minimal pain.  No redness.  No purulent drainage.  She thinks symptoms seem to be improving.  She has been keeping the area clean and dry.       Objective:  Physical Exam: BP (!) 161/61   Pulse (!) 58   Temp 98 F (36.7 C) (Temporal)   Ht 5\' 2"  (1.575 m)   Wt 138 lb 6.4 oz (62.8 kg)   LMP  (Exact Date)   BMI 25.31 kg/m   Gen: No acute distress, resting comfortably MSK: Laceration on radial aspect of right fifth digit status post repair.  Sutures in place.  No erythema.  No excess warmth.  No drainage.  Neurovascular intact distally.  Full range of motion at DIP joint. Neuro: Grossly normal, moves all extremities Psych: Normal affect and thought content      Annasophia Crocker M. Jerline Pain, MD 12/16/2019 11:43 AM

## 2019-12-19 ENCOUNTER — Other Ambulatory Visit: Payer: Self-pay | Admitting: Family Medicine

## 2019-12-27 ENCOUNTER — Other Ambulatory Visit: Payer: Self-pay

## 2019-12-27 ENCOUNTER — Ambulatory Visit (INDEPENDENT_AMBULATORY_CARE_PROVIDER_SITE_OTHER): Payer: Medicare Other | Admitting: Family Medicine

## 2019-12-27 ENCOUNTER — Encounter: Payer: Self-pay | Admitting: Family Medicine

## 2019-12-27 VITALS — BP 158/57 | HR 63 | Temp 98.0°F | Ht 62.0 in | Wt 138.2 lb

## 2019-12-27 DIAGNOSIS — M79644 Pain in right finger(s): Secondary | ICD-10-CM | POA: Diagnosis not present

## 2019-12-27 NOTE — Progress Notes (Signed)
   Jane Cox is a 86 y.o. female who presents today for an office visit.  Assessment/Plan:  New/Acute Problems: Right Finger Pain Normal exam. Sutures removed today. She tolerated well.  She can resume normal and hygiene.  Discussed reasons to return to care. She can use OTC meds as needed for pain.     Subjective:  HPI:  Patient here for finger pain follow-up.  Was seen about a week ago for laceration follow-up.  Pain has been minimal.  She would like to have sutures removed today.       Objective:  Physical Exam: BP (!) 158/57   Pulse 63   Temp 98 F (36.7 C) (Temporal)   Ht 5\' 2"  (1.575 m)   Wt 138 lb 3.2 oz (62.7 kg)   SpO2 95%   BMI 25.28 kg/m   Gen: No acute distress, resting comfortably Skin: Well healed lesion on right fifth digit.  Neuro: Grossly normal, moves all extremities Psych: Normal affect and thought content  8 simple interrupted sutures were removed without difficulty.       Algis Greenhouse. Jerline Pain, MD 12/27/2019 1:23 PM

## 2019-12-27 NOTE — Patient Instructions (Signed)
It was very nice to see you today!  We removed your stitches today.  You can continue your normal routine.  Please let us know if you have any issues.  Take care, Dr Jerline Pain  Please try these tips to maintain a healthy lifestyle:   Eat at least 3 REAL meals and 1-2 snacks per day.  Aim for no more than 5 hours between eating.  If you eat breakfast, please do so within one hour of getting up.    Each meal should contain half fruits/vegetables, one quarter protein, and one quarter carbs (no bigger than a computer mouse)   Cut down on sweet beverages. This includes juice, soda, and sweet tea.     Drink at least 1 glass of water with each meal and aim for at least 8 glasses per day   Exercise at least 150 minutes every week.

## 2020-01-06 DIAGNOSIS — I788 Other diseases of capillaries: Secondary | ICD-10-CM | POA: Diagnosis not present

## 2020-01-06 DIAGNOSIS — L570 Actinic keratosis: Secondary | ICD-10-CM | POA: Diagnosis not present

## 2020-01-06 DIAGNOSIS — Z85828 Personal history of other malignant neoplasm of skin: Secondary | ICD-10-CM | POA: Diagnosis not present

## 2020-01-12 NOTE — Progress Notes (Signed)
Phone [phone] In person visit   Subjective:   Jane Cox is a 86 y.o. year old very pleasant female patient who presents for/with See problem oriented charting Chief Complaint  Patient presents with  . Hypertension  . Hyperlipidemia  . Medication Discussion    Wants to discuss fosamax and her stool cards    This visit occurred during the SARS-CoV-2 public health emergency.  Safety protocols were in place, including screening questions prior to the visit, additional usage of staff PPE, and extensive cleaning of exam room while observing appropriate contact time as indicated for disinfecting solutions.   Past Medical History-  Patient Active Problem List   Diagnosis Date Noted  . Renal artery stenosis ([place]) 12/15/2013    Priority: Medium  . Hyperlipidemia 09/10/2006    Priority: Medium  . Essential hypertension 08/18/2006    Priority: Medium  . BREAST CANCER, HX OF 08/18/2006    Priority: Medium  . Ruptured Bakers cyst 01/13/2018    Priority: Low  . Skin cancer, basal cell 04/20/2015    Priority: Low  . Lumbar degenerative disc disease 05/23/2014    Priority: Low  . Arthritis of left hip 05/23/2014    Priority: Low  . Greater trochanteric bursitis of right hip 05/23/2014    Priority: Low  . Allergic rhinitis 08/18/2006    Priority: Low  . Osteopenia 08/18/2006    Priority: Low    Medications- reviewed and updated Current Outpatient Medications  Medication Sig Dispense Refill  . acetaminophen (TYLENOL) 650 MG CR tablet Take 650 mg by mouth 2 (two) times daily. Reported on 02/27/2015    . amLODipine (NORVASC) 5 MG tablet TAKE 1 TABLET BY MOUTH TWICE A DAY (Patient taking differently: Take 5 mg by mouth daily.) 180 tablet 3  . atorvastatin (LIPITOR) 20 MG tablet TAKE 1/2 TABLET BY MOUTH EVERY DAY 45 tablet 1  . hydrochlorothiazide (MICROZIDE) 12.5 MG capsule TAKE 1 CAPSULE BY MOUTH EVERY DAY 90 capsule 1  . labetalol (NORMODYNE) 100 MG tablet TAKE 1 TABLET BY  MOUTH TWICE A DAY 180 tablet 3  . lisinopril (ZESTRIL) 40 MG tablet TAKE 1 TABLET BY MOUTH EVERY DAY 90 tablet 1  . Multiple Vitamin (MULTIVITAMIN) tablet Take 1 tablet by mouth daily.     No current facility-administered medications for this visit.     Objective:  BP (!) 157/58   Pulse 70   Temp 97.9 F (36.6 C) (Temporal)   Ht 5\' 2"  (1.575 m)   Wt 137 lb 6.4 oz (62.3 kg)   SpO2 96%   BMI 25.13 kg/m  Gen: NAD, resting comfortably CV: RRR no murmurs rubs or gallops Lungs: CTAB no crackles, wheeze, rhonchi Abdomen: soft/nontender/nondistended/normal bowel sounds. No rebound or guarding.  Ext: no edema Skin: warm, dry    Assessment and Plan    #Low bone density/osteopenia S: Last DEXA: 07/22/19 with worst t score -1.2 RFN but hip fracture risk score at 3.1%.  Hip fracture scores from 2017 have not significantly decreased  Medication (bisphosphonate or prolia): None  Calcium: 1200mg  (through diet ok) recommended  Vitamin D: 1000 units a day recommended A/P: We discussed very slight elevation in fracture risk but also the fact that bone density has not worsened in the last 4 years significantly.  Instead of starting Fosamax-she is very concerned about reflux symptoms as she already has intermittent issues at baseline she would like to monitor and recheck 2 years -With reflux concerns we would consider Reclast if needed  #  anemia- slight intermittent. Did further lookback and patient has had Hgb in 11s and 12 for at least last 10 years- we opted to not pursue stool cards.   #hyperlipidemia S: Medication: atorvastatin 10 mg (half of 20mg  tablet) Lab Results  Component Value Date   CHOL 143 01/12/2019   HDL 50.30 01/12/2019   LDLCALC 54 12/11/2017   LDLDIRECT 63.0 01/12/2019   TRIG 278.0 (H) 01/12/2019   CHOLHDL 3 01/12/2019   A/P: Due for repeat blood work-update lipid panel today.  Suspect will remain well controlled-likely continue current medication  #hypertension with  whitecoat element S: medication:  lisinopril 40mg , labetalol 100mg  BID, hctz 12.5mg , amlodipine 5 mg Home readings #s: 134.8/55 average over last 5 home readings BP Readings from Last 3 Encounters:  01/13/20 (!) 157/58  12/27/19 (!) 158/57  12/16/19 (!) 161/61  A/P: Continues to be poorly controlled in office but very well controlled at home-with home average less than 135/85 we will continue current medications -Patient does have unilateral renal artery stenosis with angioplasty in the past but that should not cause blood pressure issues  Recommended follow up: Return in about 6 months (around 07/13/2020) for follow up- or sooner if needed. Future Appointments  Date Time Provider Leonore  07/18/2020  3:00 PM Marin Olp, MD [facility]   Lab/Order associations:   ICD-10-CM   1. Hyperlipidemia, unspecified hyperlipidemia type  E78.5 CBC With Differential/Platelet    COMPLETE METABOLIC PANEL WITH GFR    Lipid Panel w/reflex Direct LDL    Lipid Panel w/reflex Direct LDL    COMPLETE METABOLIC PANEL WITH GFR    CBC With Differential/Platelet  2. Essential hypertension  I10   3. Renal artery stenosis (HCC)  I70.1   4. Osteopenia of neck of left femur  M85.852    Return precautions advised.  Garret Reddish, MD

## 2020-01-13 ENCOUNTER — Encounter: Payer: Self-pay | Admitting: Family Medicine

## 2020-01-13 ENCOUNTER — Ambulatory Visit (INDEPENDENT_AMBULATORY_CARE_PROVIDER_SITE_OTHER): Payer: Medicare Other | Admitting: Family Medicine

## 2020-01-13 ENCOUNTER — Other Ambulatory Visit: Payer: Self-pay

## 2020-01-13 VITALS — BP 157/58 | HR 70 | Temp 97.9°F | Ht 62.0 in | Wt 137.4 lb

## 2020-01-13 DIAGNOSIS — E785 Hyperlipidemia, unspecified: Secondary | ICD-10-CM

## 2020-01-13 DIAGNOSIS — I701 Atherosclerosis of renal artery: Secondary | ICD-10-CM | POA: Diagnosis not present

## 2020-01-13 DIAGNOSIS — I10 Essential (primary) hypertension: Secondary | ICD-10-CM

## 2020-01-13 DIAGNOSIS — M85852 Other specified disorders of bone density and structure, left thigh: Secondary | ICD-10-CM | POA: Diagnosis not present

## 2020-01-13 NOTE — Patient Instructions (Addendum)
Health Maintenance Due  Topic Date Due  . MAMMOGRAM Sign a release form  09/01/2019   To keep bones health Your bone density/DEXA scan shows low bone density. Low bone density (formerly called osteopenia)a is a stage between normal and osteoporosis. You do not have to take prescription medication at this point (we opted for 2 year repeat) but I do recommend targetting good calcium intake through diet (ideally 1200mg ) + vitamin D daily (at least  [phone] units per day)  As well as regular weight bearing exercise as able. Weight-bearing aerobic activities involve doing aerobic exercise on your feet, with your bones supporting your weight. Examples include walking, dancing, low-impact aerobics, elliptical training machines, stair climbing and gardening. team please give her a calcium handout Repeat bone density in 2 years  Please stop by lab before you go If you have mychart- we will send your results within 3 business days of [place] receiving them.  If you do not have mychart- we will call you about results within 5 business days of [place] receiving them.  *please note we are currently using [facility] which has a longer processing time than [facility] typically so labs may not come back as quickly as in the past *please also note that you will see labs on mychart as soon as they post. I will later go in and write notes on them- will say "notes from Dr. Yong Channel"  Recommended follow up: Return in about 6 months (around 07/13/2020) for follow up- or sooner if needed.

## 2020-01-14 LAB — CBC WITH DIFFERENTIAL/PLATELET
Absolute Monocytes: 544 cells/uL (ref 200–950)
Basophils Absolute: 19 cells/uL (ref 0–200)
Basophils Relative: 0.3 %
Eosinophils Absolute: 83 cells/uL (ref 15–500)
Eosinophils Relative: 1.3 %
HCT: 35.1 % (ref 35.0–45.0)
Hemoglobin: 11.7 g/dL (ref 11.7–15.5)
Lymphs Abs: 1798 cells/uL (ref 850–3900)
MCH: 29.1 pg (ref 27.0–33.0)
MCHC: 33.3 g/dL (ref 32.0–36.0)
MCV: 87.3 fL (ref 80.0–100.0)
MPV: 11 fL (ref 7.5–12.5)
Monocytes Relative: 8.5 %
Neutro Abs: 3955 cells/uL (ref 1500–7800)
Neutrophils Relative %: 61.8 %
Platelets: 249 10*3/uL (ref 140–400)
RBC: 4.02 10*6/uL (ref 3.80–5.10)
RDW: 12.7 % (ref 11.0–15.0)
Total Lymphocyte: 28.1 %
WBC: 6.4 10*3/uL (ref 3.8–10.8)

## 2020-01-14 LAB — COMPLETE METABOLIC PANEL WITH GFR
AG Ratio: 2.2 (calc) (ref 1.0–2.5)
ALT: 17 U/L (ref 6–29)
AST: 14 U/L (ref 10–35)
Albumin: 4.4 g/dL (ref 3.6–5.1)
Alkaline phosphatase (APISO): 97 U/L (ref 37–153)
BUN: 15 mg/dL (ref 7–25)
CO2: 28 mmol/L (ref 20–32)
Calcium: 9.8 mg/dL (ref 8.6–10.4)
Chloride: 104 mmol/L (ref 98–110)
Creat: 0.73 mg/dL (ref 0.60–0.88)
GFR, Est African American: 88 mL/min/{1.73_m2} (ref >=60)
GFR, Est Non African American: 76 mL/min/{1.73_m2} (ref >=60)
Globulin: 2 g/dL (calc) (ref 1.9–3.7)
Glucose, Bld: 135 mg/dL — ABNORMAL HIGH (ref 65–99)
Potassium: 3.8 mmol/L (ref 3.5–5.3)
Sodium: 139 mmol/L (ref 135–146)
Total Bilirubin: 0.9 mg/dL (ref 0.2–1.2)
Total Protein: 6.4 g/dL (ref 6.1–8.1)

## 2020-01-14 LAB — LIPID PANEL W/REFLEX DIRECT LDL
Cholesterol: 140 mg/dL (ref <200)
HDL: 48 mg/dL — ABNORMAL LOW (ref >=50)
LDL Cholesterol (Calc): 59 mg/dL (calc)
Non-HDL Cholesterol (Calc): 92 mg/dL (calc) (ref <130)
Total CHOL/HDL Ratio: 2.9 (calc) (ref <5.0)
Triglycerides: 311 mg/dL — ABNORMAL HIGH (ref <150)

## 2020-02-03 ENCOUNTER — Other Ambulatory Visit: Payer: Self-pay | Admitting: Family Medicine

## 2020-07-06 ENCOUNTER — Telehealth: Payer: Self-pay | Admitting: Family Medicine

## 2020-07-06 NOTE — Progress Notes (Signed)
  Chronic Care Management   Note  07/06/2020 Name: Jane Cox MRN: [id number] DOB: 1934-01-11  Jane Cox is a 87 y.o. year old female who is a primary care patient of Yong Channel, Brayton Mars, MD. I reached out to Jane Cox by phone today in response to a referral sent by Jane Cox PCP, Marin Olp, MD.   Jane Cox was given information about Chronic Care Management services today including:  CCM service includes personalized support from designated clinical staff supervised by her physician, including individualized plan of care and coordination with other care providers 24/7 contact phone numbers for assistance for urgent and routine care needs. Service will only be billed when office clinical staff spend 20 minutes or more in a month to coordinate care. Only one practitioner may furnish and bill the service in a calendar month. The patient may stop CCM services at any time (effective at the end of the month) by phone call to the office staff.   Patient wishes to consider information provided and/or speak with a member of the care team before deciding about enrollment in care management services.   Follow up plan:   Jane Cox Upstream Scheduler

## 2020-07-12 ENCOUNTER — Ambulatory Visit: Payer: Medicare Other | Attending: Internal Medicine

## 2020-07-12 DIAGNOSIS — Z23 Encounter for immunization: Secondary | ICD-10-CM

## 2020-07-12 NOTE — Progress Notes (Signed)
   Covid-19 Vaccination Clinic  Name:  Jane Cox    MRN: [id number] DOB: 1933/03/18  07/12/2020  [name] was observed post Covid-19 immunization for 15 minutes without incident. She was provided with Vaccine Information Sheet and instruction to access the V-Safe system.   [name] was instructed to call 911 with any severe reactions post vaccine: Difficulty breathing  Swelling of face and throat  A fast heartbeat  A bad rash all over body  Dizziness and weakness   Immunizations Administered     Name Date Dose VIS Date Route   PFIZER Comrnaty(Gray TOP) Covid-19 Vaccine 07/12/2020  2:34 PM 0.3 mL 01/06/2020 Intramuscular   Manufacturer: [place]   Lot: [id number]   [place]: [phone]

## 2020-07-13 ENCOUNTER — Other Ambulatory Visit (HOSPITAL_BASED_OUTPATIENT_CLINIC_OR_DEPARTMENT_OTHER): Payer: Self-pay

## 2020-07-13 DIAGNOSIS — Z23 Encounter for immunization: Secondary | ICD-10-CM | POA: Diagnosis not present

## 2020-07-13 MED ORDER — COVID-19 MRNA VAC-TRIS(PFIZER) 30 MCG/0.3ML IM SUSP
INTRAMUSCULAR | 0 refills | Status: DC
  Filled 2020-07-13: qty 0.3, 1d supply, fill #0

## 2020-07-15 ENCOUNTER — Other Ambulatory Visit: Payer: Self-pay | Admitting: Family Medicine

## 2020-07-18 ENCOUNTER — Other Ambulatory Visit: Payer: Self-pay

## 2020-07-18 ENCOUNTER — Encounter: Payer: Self-pay | Admitting: Family Medicine

## 2020-07-18 ENCOUNTER — Ambulatory Visit (INDEPENDENT_AMBULATORY_CARE_PROVIDER_SITE_OTHER): Payer: Medicare Other | Admitting: Family Medicine

## 2020-07-18 VITALS — BP 133/53 | HR 71 | Temp 97.4°F | Ht 62.0 in | Wt 139.0 lb

## 2020-07-18 DIAGNOSIS — E785 Hyperlipidemia, unspecified: Secondary | ICD-10-CM | POA: Diagnosis not present

## 2020-07-18 DIAGNOSIS — I10 Essential (primary) hypertension: Secondary | ICD-10-CM | POA: Diagnosis not present

## 2020-07-18 DIAGNOSIS — I701 Atherosclerosis of renal artery: Secondary | ICD-10-CM

## 2020-07-18 NOTE — Patient Instructions (Addendum)
Health Maintenance Due  Topic Date Due   Zoster Vaccines- Shingrix (1 of 2) Patient doesn't wants to do this because she just recently had her booster and wants to wait.  Never done   Renal artery stenosis check at next visit most liekly  Please stop by lab before you go If you have mychart- we will send your results within 3 business days of [place] receiving them.  If you do not have mychart- we will call you about results within 5 business days of [place] receiving them.  *please also note that you will see labs on mychart as soon as they post. I will later go in and write notes on them- will say "notes from Dr. Yong Channel"   Recommended follow up: No follow-ups on file.

## 2020-07-18 NOTE — Progress Notes (Signed)
Phone [phone] In person visit   Subjective:   Jane Cox is a 87 y.o. year old very pleasant female patient who presents for/with See problem oriented charting Chief Complaint  Patient presents with   Hypertension   Hyperlipidemia   This visit occurred during the SARS-CoV-2 public health emergency.  Safety protocols were in place, including screening questions prior to the visit, additional usage of staff PPE, and extensive cleaning of exam room while observing appropriate contact time as indicated for disinfecting solutions.   Past Medical History-  Patient Active Problem List   Diagnosis Date Noted   Renal artery stenosis ([place]) 12/15/2013    Priority: Medium   Hyperlipidemia 09/10/2006    Priority: Medium   Essential hypertension 08/18/2006    Priority: Medium   BREAST CANCER, HX OF 08/18/2006    Priority: Medium   Ruptured Bakers cyst 01/13/2018    Priority: Low   Skin cancer, basal cell 04/20/2015    Priority: Low   Lumbar degenerative disc disease 05/23/2014    Priority: Low   Arthritis of left hip 05/23/2014    Priority: Low   Greater trochanteric bursitis of right hip 05/23/2014    Priority: Low   Allergic rhinitis 08/18/2006    Priority: Low   Osteopenia 08/18/2006    Priority: Low    Medications- reviewed and updated Current Outpatient Medications  Medication Sig Dispense Refill   acetaminophen (TYLENOL) 650 MG CR tablet Take 650 mg by mouth 2 (two) times daily. Reported on 02/27/2015     amLODipine (NORVASC) 5 MG tablet TAKE 1 TABLET BY MOUTH TWICE A DAY (Patient taking differently: Take 5 mg by mouth daily.) 180 tablet 3   atorvastatin (LIPITOR) 20 MG tablet TAKE 1/2 TABLET BY MOUTH EVERY DAY 45 tablet 1   hydrochlorothiazide (MICROZIDE) 12.5 MG capsule TAKE 1 CAPSULE BY MOUTH EVERY DAY 90 capsule 1   labetalol (NORMODYNE) 100 MG tablet TAKE 1 TABLET BY MOUTH TWICE A DAY 180 tablet 3   lisinopril (ZESTRIL) 40 MG tablet TAKE 1 TABLET BY MOUTH EVERY  DAY 90 tablet 1   No current facility-administered medications for this visit.     Objective:  BP (!) 133/53 Comment: last home reading on 07/10/20  Pulse 71   Temp (!) 97.4 F (36.3 C) (Temporal)   Ht 5\' 2"  (1.575 m)   Wt 139 lb (63 kg)   SpO2 95%   BMI 25.42 kg/m  Gen: NAD, resting comfortably CV: RRR no murmurs rubs or gallops Lungs: CTAB no crackles, wheeze, rhonchi Ext: minimal edema Skin: warm, dry     Assessment and Plan  #History of breast cancer-in 1989 risk by gynecologist. Mastectomy without any adjuvant treatment. Still gets yearly mammograms-last visit 09/03/2019  #hypertension with whitecoat hypertension S: medication: Amlodipine 5 mg, Lisinopril 40 mg, hydrochlorothiazide 12.5 mg, labetalol 100 mg twice daily -In the past has been on methyldopa Home readings #s: average 133/53  BP Readings from Last 3 Encounters:  07/18/20 (!) 133/53  01/13/20 (!) 157/58  12/27/19 (!) 158/57  A/P: Home reading is excellent with 133/53 most recently-very elevated in the office today as per baseline. Stable problem. Continue current medications.  #hyperlipidemia S: Medication: Atorvastatin 10 mg (half a 20 mg tablet) Lab Results  Component Value Date   CHOL 140 01/13/2020   HDL 48 (L) 01/13/2020   LDLCALC 59 01/13/2020   LDLDIRECT 63.0 01/12/2019   TRIG 311 (H) 01/13/2020   CHOLHDL 2.9 01/13/2020   A/P: Reasonably well-controlled on  last check-continue current medication  # Low Bone density (formerly osteopenia) S: Last DEXA: 07/22/2019 with worst T score -1.2 when hip fracture risk slightly elevated at 3.1-no significant worsening from 2017  Calcium: 1200mg  (through diet ok) recommended sinus patient taking Vitamin D: 1000 units a day recommended/patient taking - Calcium and Vitamin D only through her multivitamins.  History of reflux issues-would need to consider Reclast if needed A/P: Discussed importance of calcium and vitamin D-she prefers to only take a  multivitamin for now-we will recheck bone density in 2023-if stable can remain off of medication like Reclast  #Baseline anemia-patient with hemoglobin in 11-12 range intermittently for the last 10 years with no significant recent worsening.  Update CBC with labs today  #Renal Artery Stenosis-patient no longer seeing cardiology.  Check at next visit most likely.-She wants hold off for now due to son's medical issues-will be about 5 years out from last renal artery imaging  Recommended follow up: Return in about 6 months (around 01/17/2021) for follow up- or sooner if needed.   Lab/Order associations:   ICD-10-CM   1. Essential hypertension  I10 CBC with Differential/Platelet    Comprehensive metabolic panel    2. Hyperlipidemia, unspecified hyperlipidemia type  E78.5 CBC with Differential/Platelet    Comprehensive metabolic panel    3. Renal artery stenosis (HCC)  I70.1       I,Harris Phan,acting as a scribe for Garret Reddish, MD.,have documented all relevant documentation on the behalf of Garret Reddish, MD,as directed by  Garret Reddish, MD while in the presence of Garret Reddish, MD.   I, Garret Reddish, MD, have reviewed all documentation for this visit. The documentation on 07/18/20 for the exam, diagnosis, procedures, and orders are all accurate and complete.   Return precautions advised.  Garret Reddish, MD

## 2020-07-19 LAB — CBC WITH DIFFERENTIAL/PLATELET
Basophils Absolute: 0.1 10*3/uL (ref 0.0–0.1)
Basophils Relative: 0.8 % (ref 0.0–3.0)
Eosinophils Absolute: 0.1 10*3/uL (ref 0.0–0.7)
Eosinophils Relative: 1.2 % (ref 0.0–5.0)
HCT: 35.1 % — ABNORMAL LOW (ref 36.0–46.0)
Hemoglobin: 11.9 g/dL — ABNORMAL LOW (ref 12.0–15.0)
Lymphocytes Relative: 29.8 % (ref 12.0–46.0)
Lymphs Abs: 2.1 10*3/uL (ref 0.7–4.0)
MCHC: 34 g/dL (ref 30.0–36.0)
MCV: 86.5 fl (ref 78.0–100.0)
Monocytes Absolute: 0.5 10*3/uL (ref 0.1–1.0)
Monocytes Relative: 7.1 % (ref 3.0–12.0)
Neutro Abs: 4.3 10*3/uL (ref 1.4–7.7)
Neutrophils Relative %: 61.1 % (ref 43.0–77.0)
Platelets: 241 10*3/uL (ref 150.0–400.0)
RBC: 4.05 Mil/uL (ref 3.87–5.11)
RDW: 14.2 % (ref 11.5–15.5)
WBC: 7 10*3/uL (ref 4.0–10.5)

## 2020-07-19 LAB — COMPREHENSIVE METABOLIC PANEL
ALT: 17 U/L (ref 0–35)
AST: 16 U/L (ref 0–37)
Albumin: 4.5 g/dL (ref 3.5–5.2)
Alkaline Phosphatase: 89 U/L (ref 39–117)
BUN: 14 mg/dL (ref 6–23)
CO2: 27 mEq/L (ref 19–32)
Calcium: 10 mg/dL (ref 8.4–10.5)
Chloride: 103 mEq/L (ref 96–112)
Creatinine, Ser: 0.73 mg/dL (ref 0.40–1.20)
GFR: 75.23 mL/min (ref >60.00)
Glucose, Bld: 136 mg/dL — ABNORMAL HIGH (ref 70–99)
Potassium: 3.9 mEq/L (ref 3.5–5.1)
Sodium: 138 mEq/L (ref 135–145)
Total Bilirubin: 1.1 mg/dL (ref 0.2–1.2)
Total Protein: 6.8 g/dL (ref 6.0–8.3)

## 2020-08-22 DIAGNOSIS — H26493 Other secondary cataract, bilateral: Secondary | ICD-10-CM | POA: Diagnosis not present

## 2020-09-05 DIAGNOSIS — Z1231 Encounter for screening mammogram for malignant neoplasm of breast: Secondary | ICD-10-CM | POA: Diagnosis not present

## 2020-09-05 LAB — HM MAMMOGRAPHY

## 2020-09-09 ENCOUNTER — Other Ambulatory Visit: Payer: Self-pay | Admitting: Family Medicine

## 2020-09-11 ENCOUNTER — Encounter: Payer: Self-pay | Admitting: Family Medicine

## 2020-09-19 DIAGNOSIS — D2262 Melanocytic nevi of left upper limb, including shoulder: Secondary | ICD-10-CM | POA: Diagnosis not present

## 2020-09-19 DIAGNOSIS — L738 Other specified follicular disorders: Secondary | ICD-10-CM | POA: Diagnosis not present

## 2020-09-19 DIAGNOSIS — L570 Actinic keratosis: Secondary | ICD-10-CM | POA: Diagnosis not present

## 2020-09-19 DIAGNOSIS — D1801 Hemangioma of skin and subcutaneous tissue: Secondary | ICD-10-CM | POA: Diagnosis not present

## 2020-09-19 DIAGNOSIS — Z85828 Personal history of other malignant neoplasm of skin: Secondary | ICD-10-CM | POA: Diagnosis not present

## 2020-09-19 DIAGNOSIS — L821 Other seborrheic keratosis: Secondary | ICD-10-CM | POA: Diagnosis not present

## 2020-09-19 DIAGNOSIS — L7211 Pilar cyst: Secondary | ICD-10-CM | POA: Diagnosis not present

## 2020-09-19 DIAGNOSIS — D2271 Melanocytic nevi of right lower limb, including hip: Secondary | ICD-10-CM | POA: Diagnosis not present

## 2020-10-03 ENCOUNTER — Other Ambulatory Visit (HOSPITAL_BASED_OUTPATIENT_CLINIC_OR_DEPARTMENT_OTHER): Payer: Self-pay

## 2020-10-17 DIAGNOSIS — H18413 Arcus senilis, bilateral: Secondary | ICD-10-CM | POA: Diagnosis not present

## 2020-10-17 DIAGNOSIS — H02831 Dermatochalasis of right upper eyelid: Secondary | ICD-10-CM | POA: Diagnosis not present

## 2020-10-17 DIAGNOSIS — H26493 Other secondary cataract, bilateral: Secondary | ICD-10-CM | POA: Diagnosis not present

## 2020-10-17 DIAGNOSIS — Z961 Presence of intraocular lens: Secondary | ICD-10-CM | POA: Diagnosis not present

## 2020-10-17 DIAGNOSIS — H26491 Other secondary cataract, right eye: Secondary | ICD-10-CM | POA: Diagnosis not present

## 2020-10-20 DIAGNOSIS — Z23 Encounter for immunization: Secondary | ICD-10-CM | POA: Diagnosis not present

## 2020-11-09 DIAGNOSIS — H26492 Other secondary cataract, left eye: Secondary | ICD-10-CM | POA: Diagnosis not present

## 2020-12-09 ENCOUNTER — Other Ambulatory Visit: Payer: Self-pay | Admitting: Family Medicine

## 2020-12-28 ENCOUNTER — Ambulatory Visit: Payer: Medicare Other | Attending: Internal Medicine

## 2020-12-28 DIAGNOSIS — Z23 Encounter for immunization: Secondary | ICD-10-CM

## 2020-12-28 NOTE — Progress Notes (Signed)
   Covid-19 Vaccination Clinic  Name:  Jane Cox    MRN: [id number] DOB: [birth year]  12/28/2020  Jane Cox was observed post Covid-19 immunization for 15 minutes without incident. She was provided with Vaccine Information Sheet and instruction to access the V-Safe system.   Jane Cox was instructed to call 911 with any severe reactions post vaccine: Difficulty breathing  Swelling of face and throat  A fast heartbeat  A bad rash all over body  Dizziness and weakness   Immunizations Administered     Name Date Dose VIS Date Route   Pfizer Covid-19 Vaccine Bivalent Booster 12/28/2020  2:11 PM 0.3 mL 09/27/2020 Intramuscular   Manufacturer: [place]   Lot: [id number]   [place]: [phone]

## 2020-12-30 ENCOUNTER — Other Ambulatory Visit (HOSPITAL_BASED_OUTPATIENT_CLINIC_OR_DEPARTMENT_OTHER): Payer: Self-pay

## 2020-12-30 DIAGNOSIS — Z23 Encounter for immunization: Secondary | ICD-10-CM | POA: Diagnosis not present

## 2020-12-30 MED ORDER — PFIZER COVID-19 VAC BIVALENT 30 MCG/0.3ML IM SUSP
INTRAMUSCULAR | 0 refills | Status: DC
  Filled 2020-12-30: qty 0.3, 1d supply, fill #0

## 2021-01-01 ENCOUNTER — Other Ambulatory Visit (HOSPITAL_BASED_OUTPATIENT_CLINIC_OR_DEPARTMENT_OTHER): Payer: Self-pay

## 2021-01-09 ENCOUNTER — Ambulatory Visit: Payer: Medicare Other | Admitting: Family Medicine

## 2021-02-13 ENCOUNTER — Ambulatory Visit (INDEPENDENT_AMBULATORY_CARE_PROVIDER_SITE_OTHER): Payer: Medicare Other | Admitting: Family Medicine

## 2021-02-13 ENCOUNTER — Other Ambulatory Visit: Payer: Self-pay

## 2021-02-13 ENCOUNTER — Encounter: Payer: Self-pay | Admitting: Family Medicine

## 2021-02-13 DIAGNOSIS — I10 Essential (primary) hypertension: Secondary | ICD-10-CM

## 2021-02-13 DIAGNOSIS — E785 Hyperlipidemia, unspecified: Secondary | ICD-10-CM | POA: Diagnosis not present

## 2021-02-13 DIAGNOSIS — M85852 Other specified disorders of bone density and structure, left thigh: Secondary | ICD-10-CM | POA: Diagnosis not present

## 2021-02-13 DIAGNOSIS — I701 Atherosclerosis of renal artery: Secondary | ICD-10-CM | POA: Diagnosis not present

## 2021-02-13 NOTE — Patient Instructions (Addendum)
Health Maintenance Due  Topic Date Due   Zoster Vaccines- Shingrix (1 of 2) -Please check with your pharmacy to see if they have the shingrix vaccine. If they do- please get this immunization and update [place] by phone call or mychart with dates you receive the vaccine - she wants to hold off for now  Never done   Please stop by lab before you go If you have mychart- we will send your results within 3 business days of [place] receiving them.  If you do not have mychart- we will call you about results within 5 business days of [place] receiving them.  *please also note that you will see labs on mychart as soon as they post. I will later go in and write notes on them- will say "notes from Dr. Yong Channel"  Recommended follow up: Return in about 6 months (around 08/13/2021) for a follow-up or sooner if needed.

## 2021-02-13 NOTE — Progress Notes (Signed)
Phone [phone] In person visit   Subjective:   Jane Cox is a 88 y.o. year old very pleasant female patient who presents for/with See problem oriented charting Chief Complaint  Patient presents with   Follow-up   Hypertension   Hyperlipidemia    This visit occurred during the SARS-CoV-2 public health emergency.  Safety protocols were in place, including screening questions prior to the visit, additional usage of staff PPE, and extensive cleaning of exam room while observing appropriate contact time as indicated for disinfecting solutions.   Past Medical History-  Patient Active Problem List   Diagnosis Date Noted   Renal artery stenosis ([place]) 12/15/2013    Priority: Medium    Hyperlipidemia 09/10/2006    Priority: Medium    Essential hypertension 08/18/2006    Priority: Medium    BREAST CANCER, HX OF 08/18/2006    Priority: Medium    Ruptured Bakers cyst 01/13/2018    Priority: Low   Skin cancer, basal cell 04/20/2015    Priority: Low   Lumbar degenerative disc disease 05/23/2014    Priority: Low   Arthritis of left hip 05/23/2014    Priority: Low   Greater trochanteric bursitis of right hip 05/23/2014    Priority: Low   Allergic rhinitis 08/18/2006    Priority: Low   Osteopenia 08/18/2006    Priority: Low    Medications- reviewed and updated Current Outpatient Medications  Medication Sig Dispense Refill   acetaminophen (TYLENOL) 650 MG CR tablet Take 650 mg by mouth 2 (two) times daily. Reported on 02/27/2015     amLODipine (NORVASC) 5 MG tablet TAKE 1 TABLET BY MOUTH TWICE A DAY 180 tablet 3   atorvastatin (LIPITOR) 20 MG tablet TAKE 1/2 TABLET BY MOUTH EVERY DAY 45 tablet 1   hydrochlorothiazide (MICROZIDE) 12.5 MG capsule TAKE 1 CAPSULE BY MOUTH EVERY DAY 90 capsule 1   labetalol (NORMODYNE) 100 MG tablet TAKE 1 TABLET BY MOUTH TWICE A DAY 180 tablet 3   lisinopril (ZESTRIL) 40 MG tablet TAKE 1 TABLET BY MOUTH EVERY DAY 90 tablet 1   No current  facility-administered medications for this visit.     Objective:  BP 135/60 Comment: most recent home reading   Pulse 67    Temp (!) 97.5 F (36.4 C)    Ht 5\' 2"  (1.575 m)    Wt 138 lb 3.2 oz (62.7 kg)    SpO2 96%    BMI 25.28 kg/m  Gen: NAD, resting comfortably CV: RRR no murmurs rubs or gallops- did not hear murmur Lungs: CTAB no crackles, wheeze, rhonchi Ext: no edema Skin: warm, dry    Assessment and Plan   #History of breast cancer-in 1989.  Still gets yearly mammogram-last done 09/05/2020 with [facility]  #hypertension with whitecoat hypertension S: medication: Amlodipine 5 mg, Lisinopril 40 mg, hydrochlorothiazide 12.5 mg, labetalol 100 mg twice daily -In the past has been on methyldopa Home readings #s: 135/60  BP Readings from Last 3 Encounters:  02/13/21 135/60  07/18/20 (!) 133/53  01/13/20 (!) 157/58  A/P: Poorly controlled in office-Home readings entered above.  At home well-controlled-continue current medication  #hyperlipidemia S: Medication:Compliant with atorvastatin 10 mg (half a 20 mg tablet) Lab Results  Component Value Date   CHOL 140 01/13/2020   HDL 48 (L) 01/13/2020   LDLCALC 59 01/13/2020   LDLDIRECT 63.0 01/12/2019   TRIG 311 (H) 01/13/2020   CHOLHDL 2.9 01/13/2020   A/P: Excellent control last check but due for repeat-update lipid  panel today  # Low Bone density (formerly osteopenia) S: Last DEXA: 07/22/2019 with worst T score -1.2 when hip fracture risk slightly elevated at 3.1-no significant worsening from 2017  Calcium: 1200mg  (through diet ok) recommended-gets some through [facility] Vitamin D: 1000 units a day recommended- taking confirmed today -Calcium and Vitamin D only through her multivitamins  History of reflux issues-would need to consider Reclast if needed but not needed at this time A/P: Reasonable control without medication - also check vitamin D with osteopenia- has bene taking Vitamin thorugh [facility] and we want to make sure levels do not get  too high  #Baseline anemia-patient with hemoglobin in 11-12 range intermittently for the last 10 years with no significant recent worsening.  Update CBC today.    #Renal Artery Stenosis-patient no longer seeing cardiology. We had planned to check today but she has thought this over and wants to hold off on ongoing basis- and focus on control of cholesterol and blood pressure - she would not be interested in intervention other than medical. Had been considration of stenting in past  Recommended follow up: No follow-ups on file.  Lab/Order associations:   ICD-10-CM   1. Renal artery stenosis (HCC)  I70.1     2. Essential hypertension  I10     3. Hyperlipidemia, unspecified hyperlipidemia type  E78.5 CBC with Differential/Platelet    Comprehensive metabolic panel    Lipid panel    4. Osteopenia of neck of left femur  M85.852 Vitamin D (25 hydroxy)      No orders of the defined types were placed in this encounter.  I,Harris Phan,acting as a Education administrator for Garret Reddish, MD.,have documented all relevant documentation on the behalf of Garret Reddish, MD,as directed by  Garret Reddish, MD while in the presence of Garret Reddish, MD.    I, Garret Reddish, MD, have reviewed all documentation for this visit. The documentation on 02/13/21 for the exam, diagnosis, procedures, and orders are all accurate and complete.   Return precautions advised.  Garret Reddish, MD

## 2021-02-14 ENCOUNTER — Other Ambulatory Visit: Payer: Self-pay | Admitting: Family Medicine

## 2021-02-14 LAB — COMPREHENSIVE METABOLIC PANEL
ALT: 18 U/L (ref 0–35)
AST: 17 U/L (ref 0–37)
Albumin: 4.6 g/dL (ref 3.5–5.2)
Alkaline Phosphatase: 90 U/L (ref 39–117)
BUN: 13 mg/dL (ref 6–23)
CO2: 27 mEq/L (ref 19–32)
Calcium: 9.8 mg/dL (ref 8.4–10.5)
Chloride: 103 mEq/L (ref 96–112)
Creatinine, Ser: 0.78 mg/dL (ref 0.40–1.20)
GFR: 69.2 mL/min (ref >60.00)
Glucose, Bld: 100 mg/dL — ABNORMAL HIGH (ref 70–99)
Potassium: 4.1 mEq/L (ref 3.5–5.1)
Sodium: 139 mEq/L (ref 135–145)
Total Bilirubin: 0.8 mg/dL (ref 0.2–1.2)
Total Protein: 6.9 g/dL (ref 6.0–8.3)

## 2021-02-14 LAB — CBC WITH DIFFERENTIAL/PLATELET
Basophils Absolute: 0 10*3/uL (ref 0.0–0.1)
Basophils Relative: 0.5 % (ref 0.0–3.0)
Eosinophils Absolute: 0.1 10*3/uL (ref 0.0–0.7)
Eosinophils Relative: 2 % (ref 0.0–5.0)
HCT: 35.9 % — ABNORMAL LOW (ref 36.0–46.0)
Hemoglobin: 12 g/dL (ref 12.0–15.0)
Lymphocytes Relative: 31.7 % (ref 12.0–46.0)
Lymphs Abs: 2.2 10*3/uL (ref 0.7–4.0)
MCHC: 33.3 g/dL (ref 30.0–36.0)
MCV: 86.6 fl (ref 78.0–100.0)
Monocytes Absolute: 0.6 10*3/uL (ref 0.1–1.0)
Monocytes Relative: 9 % (ref 3.0–12.0)
Neutro Abs: 3.9 10*3/uL (ref 1.4–7.7)
Neutrophils Relative %: 56.8 % (ref 43.0–77.0)
Platelets: 240 10*3/uL (ref 150.0–400.0)
RBC: 4.15 Mil/uL (ref 3.87–5.11)
RDW: 14.2 % (ref 11.5–15.5)
WBC: 6.8 10*3/uL (ref 4.0–10.5)

## 2021-02-14 LAB — LDL CHOLESTEROL, DIRECT: Direct LDL: 69 mg/dL

## 2021-02-14 LAB — LIPID PANEL
Cholesterol: 145 mg/dL (ref 0–200)
HDL: 51.1 mg/dL (ref >39.00)
NonHDL: 93.76
Total CHOL/HDL Ratio: 3
Triglycerides: 267 mg/dL — ABNORMAL HIGH (ref 0.0–149.0)
VLDL: 53.4 mg/dL — ABNORMAL HIGH (ref 0.0–40.0)

## 2021-02-14 LAB — VITAMIN D 25 HYDROXY (VIT D DEFICIENCY, FRACTURES): VITD: 22.4 ng/mL — ABNORMAL LOW (ref 30.00–100.00)

## 2021-02-14 MED ORDER — VITAMIN D (ERGOCALCIFEROL) 1.25 MG (50000 UNIT) PO CAPS: 50000.0000 [IU] | ORAL_CAPSULE | ORAL | 1 refills | Status: DC

## 2021-03-09 ENCOUNTER — Other Ambulatory Visit: Payer: Self-pay | Admitting: Family Medicine

## 2021-04-30 ENCOUNTER — Other Ambulatory Visit: Payer: Self-pay | Admitting: Family Medicine

## 2021-05-17 DIAGNOSIS — L7211 Pilar cyst: Secondary | ICD-10-CM | POA: Diagnosis not present

## 2021-05-17 DIAGNOSIS — L720 Epidermal cyst: Secondary | ICD-10-CM | POA: Diagnosis not present

## 2021-05-17 DIAGNOSIS — Z85828 Personal history of other malignant neoplasm of skin: Secondary | ICD-10-CM | POA: Diagnosis not present

## 2021-08-08 ENCOUNTER — Other Ambulatory Visit: Payer: Self-pay | Admitting: Family Medicine

## 2021-08-21 ENCOUNTER — Ambulatory Visit: Payer: Medicare Other | Admitting: Family Medicine

## 2021-08-22 DIAGNOSIS — H35373 Puckering of macula, bilateral: Secondary | ICD-10-CM | POA: Diagnosis not present

## 2021-09-04 ENCOUNTER — Ambulatory Visit (INDEPENDENT_AMBULATORY_CARE_PROVIDER_SITE_OTHER): Payer: Medicare Other | Admitting: Family Medicine

## 2021-09-04 ENCOUNTER — Encounter: Payer: Self-pay | Admitting: Family Medicine

## 2021-09-04 VITALS — BP 138/68 | HR 66 | Temp 98.0°F | Ht <= 58 in | Wt 138.8 lb

## 2021-09-04 DIAGNOSIS — I701 Atherosclerosis of renal artery: Secondary | ICD-10-CM | POA: Diagnosis not present

## 2021-09-04 DIAGNOSIS — M85852 Other specified disorders of bone density and structure, left thigh: Secondary | ICD-10-CM | POA: Diagnosis not present

## 2021-09-04 DIAGNOSIS — I10 Essential (primary) hypertension: Secondary | ICD-10-CM | POA: Diagnosis not present

## 2021-09-04 DIAGNOSIS — E785 Hyperlipidemia, unspecified: Secondary | ICD-10-CM | POA: Diagnosis not present

## 2021-09-04 MED ORDER — AMLODIPINE BESYLATE 5 MG PO TABS: 5.0000 mg | ORAL_TABLET | Freq: Every day | ORAL | 3 refills | Status: DC

## 2021-09-04 NOTE — Progress Notes (Signed)
Phone [phone] In person visit   Subjective:   Jane Cox is a 88 y.o. year old very pleasant female patient who presents for/with See problem oriented charting Chief Complaint  Patient presents with   Follow-up   Hypertension   Hyperlipidemia   Past Medical History-  Patient Active Problem List   Diagnosis Date Noted   Renal artery stenosis ([place]) 12/15/2013    Priority: Medium    Hyperlipidemia 09/10/2006    Priority: Medium    Essential hypertension 08/18/2006    Priority: Medium    BREAST CANCER, HX OF 08/18/2006    Priority: Medium    Ruptured Bakers cyst 01/13/2018    Priority: Low   Skin cancer, basal cell 04/20/2015    Priority: Low   Lumbar degenerative disc disease 05/23/2014    Priority: Low   Arthritis of left hip 05/23/2014    Priority: Low   Greater trochanteric bursitis of right hip 05/23/2014    Priority: Low   Allergic rhinitis 08/18/2006    Priority: Low   Osteopenia 08/18/2006    Priority: Low    Medications- reviewed and updated Current Outpatient Medications  Medication Sig Dispense Refill   acetaminophen (TYLENOL) 650 MG CR tablet Take 650 mg by mouth 2 (two) times daily. Reported on 02/27/2015     atorvastatin (LIPITOR) 20 MG tablet TAKE 1/2 TABLET BY MOUTH EVERY DAY 45 tablet 1   hydrochlorothiazide (MICROZIDE) 12.5 MG capsule TAKE 1 CAPSULE BY MOUTH EVERY DAY 90 capsule 1   labetalol (NORMODYNE) 100 MG tablet TAKE 1 TABLET BY MOUTH TWICE A DAY 180 tablet 3   lisinopril (ZESTRIL) 40 MG tablet TAKE 1 TABLET BY MOUTH EVERY DAY 90 tablet 1   amLODipine (NORVASC) 5 MG tablet Take 1 tablet (5 mg total) by mouth daily. 90 tablet 3   Vitamin D, Ergocalciferol, (DRISDOL) 1.25 MG (50000 UNIT) CAPS capsule Take 1 capsule (50,000 Units total) by mouth every 7 (seven) days. 13 capsule 1   No current facility-administered medications for this visit.     Objective:  BP 138/68   Pulse 66   Temp 98 F (36.7 C)   Ht '4\' 9"'$  (1.448 m)   Wt 138  lb 12.8 oz (63 kg)   SpO2 96%   BMI 30.04 kg/m  Gen: NAD, resting comfortably TM normal CV: RRR no murmurs rubs or gallops (do not hear prior murmur today) Lungs: CTAB no crackles, wheeze, rhonchi Ext: no edema Skin: warm, dry    Assessment and Plan   #scheduled for mammogram with history breast cancer  # Cyst from scalp removed by Dr. Sarajane Jews- had been there for 50 years but was increasing in size and leaking fluid in April- pilar cyst/benign  #hearing aids last updated in 2017- wants to get a recheck with [facility]- plans to schedule follow up   #hypertension #Renal artery stenosis-unilateral and should not cause blood pressure issues-was released by cardiology in 2018-she declines further imaging S: medication: Amlodipine 5 mg, hydrochlorothiazide 12.5 mg, labetalol twice daily, lisinopril 40 mg daily Home readings #s: 130s/60s at home A/P: Bp- Controlled. Continue current medications.  Renal artery stenosis- bp controlled and defers further imaging  #hyperlipidemia S: Medication:Atorvastatin 20 mg-takes half tablet daily Lab Results  Component Value Date   CHOL 145 02/13/2021   HDL 51.10 02/13/2021   LDLCALC 59 01/13/2020   LDLDIRECT 69.0 02/13/2021   TRIG 267.0 (H) 02/13/2021   CHOLHDL 3 02/13/2021  A/P: reasonably controlled last check- continue low  dose atorvastatin '10mg'$   #Vitamin D deficiency S: Medication: Has required high-dose vitamin D- finished 6 months-  A/P: recommend taking 1000 units of vitamin D daily for maintenance- recheck vitamin D next visit   # Low Bone density (formerly osteopenia) S: Last DEXA: 07/22/2019 with worst T score -1.2-hip fracture risk was elevated at 3.1% but stable from 2017  Medication: None-we will need to consider Reclast if needed due to GERD   Calcium: '1200mg'$  (through diet ok) recommended -get something multivitamin Vitamin D: 1000 units a day recommended-see vitamin D section A/P: hopefully stable- she prefers  to check next year- 3 years out   #Baseline anemia-hemoglobin 11-12 range intermittently for last 10 years with no recent worsening- looked good last time- we opted to wait and repeat next visit   Recommended follow up: Return in about 6 months (around 03/07/2022) for followup or sooner if needed.Schedule b4 you leave.  Lab/Order associations:   ICD-10-CM   1. Essential hypertension  I10     2. Hyperlipidemia, unspecified hyperlipidemia type  E78.5     3. Renal artery stenosis (HCC)  I70.1     4. Osteopenia of neck of left femur  M85.852       Meds ordered this encounter  Medications   amLODipine (NORVASC) 5 MG tablet    Sig: Take 1 tablet (5 mg total) by mouth daily.    Dispense:  90 tablet    Refill:  3    Return precautions advised.  Garret Reddish, MD

## 2021-09-04 NOTE — Patient Instructions (Addendum)
Flu shot- we should have these available within a month or two but please let us know if you get at outside pharmacy - new covid shot likely released in october  recommend taking 1000 units of vitamin D daily for maintenance- recheck vitamin D next visit  Recommended follow up: Return in about 6 months (around 03/07/2022) for followup or sooner if needed.Schedule b4 you leave.  -bloodwork next visit

## 2021-09-18 DIAGNOSIS — Z1231 Encounter for screening mammogram for malignant neoplasm of breast: Secondary | ICD-10-CM | POA: Diagnosis not present

## 2021-09-24 DIAGNOSIS — L720 Epidermal cyst: Secondary | ICD-10-CM | POA: Diagnosis not present

## 2021-09-24 DIAGNOSIS — L821 Other seborrheic keratosis: Secondary | ICD-10-CM | POA: Diagnosis not present

## 2021-09-24 DIAGNOSIS — D2262 Melanocytic nevi of left upper limb, including shoulder: Secondary | ICD-10-CM | POA: Diagnosis not present

## 2021-09-24 DIAGNOSIS — D225 Melanocytic nevi of trunk: Secondary | ICD-10-CM | POA: Diagnosis not present

## 2021-09-24 DIAGNOSIS — D1801 Hemangioma of skin and subcutaneous tissue: Secondary | ICD-10-CM | POA: Diagnosis not present

## 2021-09-24 DIAGNOSIS — L918 Other hypertrophic disorders of the skin: Secondary | ICD-10-CM | POA: Diagnosis not present

## 2021-09-24 DIAGNOSIS — Z85828 Personal history of other malignant neoplasm of skin: Secondary | ICD-10-CM | POA: Diagnosis not present

## 2021-09-24 DIAGNOSIS — D2271 Melanocytic nevi of right lower limb, including hip: Secondary | ICD-10-CM | POA: Diagnosis not present

## 2021-09-25 DIAGNOSIS — R928 Other abnormal and inconclusive findings on diagnostic imaging of breast: Secondary | ICD-10-CM | POA: Diagnosis not present

## 2021-09-25 DIAGNOSIS — R922 Inconclusive mammogram: Secondary | ICD-10-CM | POA: Diagnosis not present

## 2021-09-25 LAB — HM MAMMOGRAPHY

## 2021-10-19 ENCOUNTER — Other Ambulatory Visit: Payer: Self-pay | Admitting: Family Medicine

## 2021-10-22 ENCOUNTER — Encounter: Payer: Self-pay | Admitting: *Deleted

## 2021-10-29 DIAGNOSIS — Z23 Encounter for immunization: Secondary | ICD-10-CM | POA: Diagnosis not present

## 2021-12-04 ENCOUNTER — Other Ambulatory Visit (HOSPITAL_BASED_OUTPATIENT_CLINIC_OR_DEPARTMENT_OTHER): Payer: Self-pay

## 2021-12-04 DIAGNOSIS — Z23 Encounter for immunization: Secondary | ICD-10-CM | POA: Diagnosis not present

## 2021-12-04 MED ORDER — COMIRNATY 30 MCG/0.3ML IM SUSY
PREFILLED_SYRINGE | INTRAMUSCULAR | 0 refills | Status: DC
  Filled 2021-12-04: qty 0.3, 1d supply, fill #0

## 2022-01-11 ENCOUNTER — Encounter: Payer: Self-pay | Admitting: Family Medicine

## 2022-01-18 ENCOUNTER — Ambulatory Visit: Payer: Medicare Other | Admitting: Family Medicine

## 2022-01-23 ENCOUNTER — Other Ambulatory Visit: Payer: Self-pay | Admitting: Family Medicine

## 2022-02-10 ENCOUNTER — Other Ambulatory Visit: Payer: Self-pay | Admitting: Family Medicine

## 2022-03-05 ENCOUNTER — Ambulatory Visit (INDEPENDENT_AMBULATORY_CARE_PROVIDER_SITE_OTHER): Payer: Medicare Other | Admitting: Family Medicine

## 2022-03-05 ENCOUNTER — Encounter: Payer: Self-pay | Admitting: Family Medicine

## 2022-03-05 VITALS — BP 144/63 | HR 66 | Temp 97.9°F | Ht <= 58 in | Wt 134.0 lb

## 2022-03-05 DIAGNOSIS — M25512 Pain in left shoulder: Secondary | ICD-10-CM | POA: Diagnosis not present

## 2022-03-05 DIAGNOSIS — E785 Hyperlipidemia, unspecified: Secondary | ICD-10-CM | POA: Diagnosis not present

## 2022-03-05 DIAGNOSIS — I701 Atherosclerosis of renal artery: Secondary | ICD-10-CM | POA: Diagnosis not present

## 2022-03-05 DIAGNOSIS — I10 Essential (primary) hypertension: Secondary | ICD-10-CM | POA: Diagnosis not present

## 2022-03-05 DIAGNOSIS — E559 Vitamin D deficiency, unspecified: Secondary | ICD-10-CM | POA: Diagnosis not present

## 2022-03-05 NOTE — Progress Notes (Signed)
Phone [phone] In person visit   Subjective:   Jane Cox is a 89 y.o. year old very pleasant female patient who presents for/with See problem oriented charting Chief Complaint  Patient presents with   Follow-up    Pt has aching left shoulder and down to elbow    Hypertension    Past Medical History-  Patient Active Problem List   Diagnosis Date Noted   Vitamin D deficiency 03/05/2022    Priority: Medium    Renal artery stenosis ([place]) 12/15/2013    Priority: Medium    Hyperlipidemia 09/10/2006    Priority: Medium    Essential hypertension 08/18/2006    Priority: Medium    BREAST CANCER, HX OF 08/18/2006    Priority: Medium    Ruptured Bakers cyst 01/13/2018    Priority: Low   Skin cancer, basal cell 04/20/2015    Priority: Low   Lumbar degenerative disc disease 05/23/2014    Priority: Low   Arthritis of left hip 05/23/2014    Priority: Low   Greater trochanteric bursitis of right hip 05/23/2014    Priority: Low   Allergic rhinitis 08/18/2006    Priority: Low   Osteopenia 08/18/2006    Priority: Low    Medications- reviewed and updated Current Outpatient Medications  Medication Sig Dispense Refill   acetaminophen (TYLENOL) 650 MG CR tablet Take 650 mg by mouth 2 (two) times daily. Reported on 02/27/2015     amLODipine (NORVASC) 5 MG tablet TAKE 1 TABLET BY MOUTH TWICE A DAY 180 tablet 3   atorvastatin (LIPITOR) 20 MG tablet TAKE 1/2 TABLET BY MOUTH DAILY 45 tablet 1   COVID-19 mRNA vaccine 2023-2024 (COMIRNATY) syringe Inject into the muscle. 0.3 mL 0   hydrochlorothiazide (MICROZIDE) 12.5 MG capsule TAKE 1 CAPSULE BY MOUTH EVERY DAY 90 capsule 1   labetalol (NORMODYNE) 100 MG tablet TAKE 1 TABLET BY MOUTH TWICE A DAY 180 tablet 3   lisinopril (ZESTRIL) 40 MG tablet TAKE 1 TABLET BY MOUTH EVERY DAY 90 tablet 1   No current facility-administered medications for this visit.     Objective:  BP (!) 144/63 Comment: most recent reading this morning  Pulse  66   Temp 97.9 F (36.6 C) (Temporal)   Ht '4\' 9"'$  (1.448 m)   Wt 134 lb (60.8 kg)   SpO2 96%   BMI 29.00 kg/m  Gen: NAD, resting comfortably CV: RRR - do not hear faint murmur that we hear at times Lungs: CTAB no crackles, wheeze, rhonchi Ext: no edema Skin: warm, dry Left shoulder- no pain over joint but positive neer, hawkin test, and empty can. Pain with push off from the back    Assessment and Plan   # Left shoulder pain S: Patient reports pain started 2-3 weeks ago- no recent injury- hit arm on laundry but months ago.  She notes it radiates to almost the elbow.  Neck pain- none. Tylenol helps some but upsets her stomach- for years one 325 at bedtime- if takes 2 three times a day upsets stomach- even tried 325 three times a day with same issues.  Numbness or tingling- none noted into the arm.  No worsening over time. Trouble sleeping- doesn't feel great on back and that's the best for the shoulder. Pain up to 8/10 A/P: Left shoulder pain- suspect bursitis vs rotator cuff injury- not ideal for nsaids due to renal artery stenosis, does not want to trial prednisone orally. -is open to sports medicine referral which was placed  today -also discussed possible PT either before or after visit- she will consult with sports medicine first and then decide on that.     #History of breast cancer-in 1989 - Mastectomy without any adjuvant treatment.  Still gets yearly mammogramswith most recent 09/25/2021  #hypertension #Renal artery stenosis-unilateral and should not cause blood pressure issues-was released by cardiology in 2018-she declines further imaging S: medication: Amlodipine 5 mg, hydrochlorothiazide 12.5 mg, labetalol twice daily, lisinopril 40 mg Home readings #s: up to 150s at home on tylenol/with pain lately but still over 60. 144/63 this am BP Readings from Last 3 Encounters:  03/05/22 (!) 144/63  09/04/21 138/68  02/13/21 135/60  A/P: Blood pressure slightly high but likely  related to pain issues- we are hoping to improve this and see if we can't get blood pressure back down.  - renal artery stenosis noted- we will update cmp with labs- has wanted to hold off on further imaging.   #hyperlipidemia #aortic atherosclerosis S: Medication:Atorvastatin 20 mg-takes half tablet daily . Thinks last lipids not fasting Lab Results  Component Value Date   CHOL 145 02/13/2021   HDL 51.10 02/13/2021   LDLCALC 59 01/13/2020   LDLDIRECT 69.0 02/13/2021   TRIG 267.0 (H) 02/13/2021   CHOLHDL 3 02/13/2021   A/P: lipids at goal on last check other than triglycerides- continue current medications and update lipdis Aortic atherosclerosis (presumed stable)- LDL goal ideally <70 - continue current medications as doing well  #Vitamin D deficiency S: Medication: Has required high-dose vitamin D- on 1000 units daily now plus MV with some Last vitamin D Lab Results  Component Value Date   VD25OH 22.40 (L) 02/13/2021  A/P: hopefully improved update vitamin D today. Continue current meds for now  # Low Bone density (formerly osteopenia) S: Last DEXA: 07/22/2019 with worst T score -1.2-hip fracture risk was elevated at 3.1% but stable from 2017  Medication: None-we will need to consider Reclast if needed due to GERD   Calcium: '1200mg'$  (through diet ok) recommended -get something multivitamin Vitamin D: 1000 units a day recommended-see vitamin D section A/P: hopefully stable- with arm issue wants to hold off on rechecking DEXA now   #Baseline anemia-hemoglobin 11-12 range intermittently for last 10 years with no recent worsening  Lab Results  Component Value Date   WBC 6.8 02/13/2021   HGB 12.0 02/13/2021   HCT 35.9 (L) 02/13/2021   MCV 86.6 02/13/2021   PLT 240.0 02/13/2021   Recommended follow up: Return in about 6 months (around 09/03/2022) for followup or sooner if needed.Schedule b4 you leave.  Lab/Order associations:   ICD-10-CM   1. Hyperlipidemia, unspecified  hyperlipidemia type  E78.5 CBC with Differential/Platelet    Comprehensive metabolic panel    Lipid panel    2. Vitamin D deficiency  E55.9 Vitamin D (25 hydroxy)    3. Essential hypertension  I10     4. Renal artery stenosis (HCC)  I70.1     5. Acute pain of left shoulder  M25.512 Ambulatory referral to Sports Medicine      No orders of the defined types were placed in this encounter.   Return precautions advised.  Garret Reddish, MD

## 2022-03-05 NOTE — Patient Instructions (Addendum)
Please stop by lab before you go If you have mychart- we will send your results within 3 business days of [place] receiving them.  If you do not have mychart- we will call you about results within 5 business days of [place] receiving them.  *please also note that you will see labs on mychart as soon as they post. I will later go in and write notes on them- will say "notes from Dr. Yong Channel"   You should hear by end of week from sports medicine-if you do not call the # listed  Blood pressure higher- if not improving after shoulder better let me know- may need to adjust medicine  Recommended follow up: Return in about 6 months (around 09/03/2022) for followup or sooner if needed.Schedule b4 you leave.

## 2022-03-06 LAB — COMPREHENSIVE METABOLIC PANEL
ALT: 17 U/L (ref 0–35)
AST: 15 U/L (ref 0–37)
Albumin: 5 g/dL (ref 3.5–5.2)
Alkaline Phosphatase: 92 U/L (ref 39–117)
BUN: 14 mg/dL (ref 6–23)
CO2: 27 mEq/L (ref 19–32)
Calcium: 10.3 mg/dL (ref 8.4–10.5)
Chloride: 102 mEq/L (ref 96–112)
Creatinine, Ser: 0.73 mg/dL (ref 0.40–1.20)
GFR: 74.37 mL/min (ref >60.00)
Glucose, Bld: 105 mg/dL — ABNORMAL HIGH (ref 70–99)
Potassium: 4 mEq/L (ref 3.5–5.1)
Sodium: 139 mEq/L (ref 135–145)
Total Bilirubin: 0.9 mg/dL (ref 0.2–1.2)
Total Protein: 7 g/dL (ref 6.0–8.3)

## 2022-03-06 LAB — CBC WITH DIFFERENTIAL/PLATELET
Basophils Absolute: 0.1 10*3/uL (ref 0.0–0.1)
Basophils Relative: 1 % (ref 0.0–3.0)
Eosinophils Absolute: 0.1 10*3/uL (ref 0.0–0.7)
Eosinophils Relative: 1.3 % (ref 0.0–5.0)
HCT: 37.3 % (ref 36.0–46.0)
Hemoglobin: 12.6 g/dL (ref 12.0–15.0)
Lymphocytes Relative: 29.5 % (ref 12.0–46.0)
Lymphs Abs: 2.4 10*3/uL (ref 0.7–4.0)
MCHC: 33.7 g/dL (ref 30.0–36.0)
MCV: 88 fl (ref 78.0–100.0)
Monocytes Absolute: 0.6 10*3/uL (ref 0.1–1.0)
Monocytes Relative: 7.6 % (ref 3.0–12.0)
Neutro Abs: 4.9 10*3/uL (ref 1.4–7.7)
Neutrophils Relative %: 60.6 % (ref 43.0–77.0)
Platelets: 263 10*3/uL (ref 150.0–400.0)
RBC: 4.23 Mil/uL (ref 3.87–5.11)
RDW: 13.9 % (ref 11.5–15.5)
WBC: 8.2 10*3/uL (ref 4.0–10.5)

## 2022-03-06 LAB — LIPID PANEL
Cholesterol: 148 mg/dL (ref 0–200)
HDL: 55.8 mg/dL (ref >39.00)
NonHDL: 91.9
Total CHOL/HDL Ratio: 3
Triglycerides: 264 mg/dL — ABNORMAL HIGH (ref 0.0–149.0)
VLDL: 52.8 mg/dL — ABNORMAL HIGH (ref 0.0–40.0)

## 2022-03-06 LAB — VITAMIN D 25 HYDROXY (VIT D DEFICIENCY, FRACTURES): VITD: 29.17 ng/mL — ABNORMAL LOW (ref 30.00–100.00)

## 2022-03-06 LAB — LDL CHOLESTEROL, DIRECT: Direct LDL: 59 mg/dL

## 2022-03-06 NOTE — Progress Notes (Unsigned)
    Jane Cox D.[facility] [place] Phone: [phone]   Assessment and Plan:     There are no diagnoses linked to this encounter.  ***   Pertinent previous records reviewed include ***   Follow Up: ***     Subjective:   I, Jane Cox, am serving as a Education administrator for Doctor Glennon Mac  Chief Complaint: left shoulder pain   HPI:   03/07/2022 Patient is a 89 year old female complaining of left shoulder pain. Patient states  Relevant Historical Information: ***  Additional pertinent review of systems negative.   Current Outpatient Medications:    acetaminophen (TYLENOL) 650 MG CR tablet, Take 650 mg by mouth 2 (two) times daily. Reported on 02/27/2015, Disp: , Rfl:    amLODipine (NORVASC) 5 MG tablet, TAKE 1 TABLET BY MOUTH TWICE A DAY, Disp: 180 tablet, Rfl: 3   atorvastatin (LIPITOR) 20 MG tablet, TAKE 1/2 TABLET BY MOUTH DAILY, Disp: 45 tablet, Rfl: 1   COVID-19 mRNA vaccine 2023-2024 (COMIRNATY) syringe, Inject into the muscle., Disp: 0.3 mL, Rfl: 0   hydrochlorothiazide (MICROZIDE) 12.5 MG capsule, TAKE 1 CAPSULE BY MOUTH EVERY DAY, Disp: 90 capsule, Rfl: 1   labetalol (NORMODYNE) 100 MG tablet, TAKE 1 TABLET BY MOUTH TWICE A DAY, Disp: 180 tablet, Rfl: 3   lisinopril (ZESTRIL) 40 MG tablet, TAKE 1 TABLET BY MOUTH EVERY DAY, Disp: 90 tablet, Rfl: 1   Objective:     There were no vitals filed for this visit.    There is no height or weight on file to calculate BMI.    Physical Exam:    ***   Electronically signed by:  Jane Cox D.[facility] 4:00 PM 03/06/22

## 2022-03-07 ENCOUNTER — Ambulatory Visit (INDEPENDENT_AMBULATORY_CARE_PROVIDER_SITE_OTHER): Payer: Medicare Other

## 2022-03-07 ENCOUNTER — Ambulatory Visit (INDEPENDENT_AMBULATORY_CARE_PROVIDER_SITE_OTHER): Payer: Medicare Other | Admitting: Sports Medicine

## 2022-03-07 VITALS — BP 160/80 | HR 69 | Ht <= 58 in | Wt 134.0 lb

## 2022-03-07 DIAGNOSIS — M7581 Other shoulder lesions, right shoulder: Secondary | ICD-10-CM

## 2022-03-07 DIAGNOSIS — M19012 Primary osteoarthritis, left shoulder: Secondary | ICD-10-CM | POA: Diagnosis not present

## 2022-03-07 DIAGNOSIS — M25512 Pain in left shoulder: Secondary | ICD-10-CM

## 2022-03-07 NOTE — Patient Instructions (Addendum)
Good to see you  PT referral  Shoulder HEP 4 week follow up

## 2022-03-27 NOTE — Therapy (Signed)
OUTPATIENT PHYSICAL THERAPY SHOULDER EVALUATION   Patient Name: Jane Cox MRN: [id number] DOB:Sep 23, 1933, 88 y.o., female Today's Date: 03/28/2022  END OF SESSION:  PT End of Session - 03/28/22 1539     Visit Number 1    Date for PT Re-Evaluation 06/20/22    PT Start Time 1404    PT Stop Time 1442    PT Time Calculation (min) 38 min    Activity Tolerance Patient tolerated treatment well    Behavior During Therapy [facility] for tasks assessed/performed             Past Medical History:  Diagnosis Date   Allergy    Basal cell carcinoma    HX: breast cancer    Hyperlipidemia    Hypertension    Osteopenia    Past Surgical History:  Procedure Laterality Date   ABDOMINAL HYSTERECTOMY  1983   BREAST SURGERY     mastectomy   CHOLECYSTECTOMY  1993   COLONOSCOPY     VEIN SURGERY     Patient Active Problem List   Diagnosis Date Noted   Vitamin D deficiency 03/05/2022   Ruptured Bakers cyst 01/13/2018   Skin cancer, basal cell 04/20/2015   Lumbar degenerative disc disease 05/23/2014   Arthritis of left hip 05/23/2014   Greater trochanteric bursitis of right hip 05/23/2014   Renal artery stenosis ([place]) 12/15/2013   Hyperlipidemia 09/10/2006   Essential hypertension 08/18/2006   Allergic rhinitis 08/18/2006   Osteopenia 08/18/2006   BREAST CANCER, HX OF 08/18/2006    PCP: Marin Olp, MD  REFERRING PROVIDER: Glennon Mac, DO  REFERRING DIAG: [phone] (ICD-10-CM) - Left shoulder pain, unspecified chronicity   THERAPY DIAG:  Chronic left shoulder pain  Muscle weakness (generalized)  Other lack of coordination  Abnormal posture  Rationale for Evaluation and Treatment: Rehabilitation  ONSET DATE: 03/07/22  SUBJECTIVE:                                                                                                                                                                                      SUBJECTIVE STATEMENT: Patient reports L shoulder pain  starting 6 weeks ago. She was diagnosed with rotator cuff injury due to her age. No visible arthritis. She received Steroid shot 3 weeks ago. She had a reaction to the shot including increased BP and racing heart which lasted x 3 days. Her pain is much better now, but she is fearful to receive any additional shots. At night she starts out sleeping on her R side, but tends to roll over onto her L shoulder and then can not get back to sleep due to the pain.  PERTINENT  HISTORY: 1. Acute pain of left shoulder 2. Right rotator cuff tendonitis  -Acute, unchanged, initial sports medicine visit - Acute left shoulder pain most consistent with rotator cuff tendinitis versus impingement syndrome with restricted ROM and no significant MOI - Patient elected for subacromial CSI.  Tolerated well per note below.  Instructed that corticosteroid may temporarily increase blood pressure in patient with hypertension  - Start HEP and physical therapy for shoulder and rotator cuff   Procedure: Subacromial Injection Side: Left  Left shoulder:  No deformity, swelling General muscle wasting, similar bilaterally and expected for age No scapular winging FF 80, abd 90, int 10, ext 70 TTP globally, though worse at [facility] joint, deltoid, biceps groove Difficult to perform special testing due to patient's limited ROM and tenderness Negative Spurling's test bilat FROM of neck   PAIN:  Are you having pain? Yes: NPRS scale: 5/10 Pain location: L shoulder, radiates into her upper arm Pain description: sharp Aggravating factors: Lifting and reaching, rolling onto the arm at night in her sleep. Relieving factors: Injection was effective. Pain was up to 10/10 prior to injection.  PRECAUTIONS: None  WEIGHT BEARING RESTRICTIONS: No  FALLS:  Has patient fallen in last 6 months? 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 He does not need physical A to move.  PATIENT GOALS:She wants to be sure that her pain remains gone.  NEXT MD VISIT:   OBJECTIVE:   DIAGNOSTIC FINDINGS:  - X-ray obtained in clinic.  My interpretation: No acute fracture or dislocation.  Minimal changes in glenohumeral joint.  Moderate cortical changes at [facility] joint  COGNITION: Overall cognitive status: Within functional limits for tasks assessed     SENSATION: WFL  POSTURE: Rounded shoulders, slightly forward head, increased thoracic kyphosis  UPPER EXTREMITY ROM:   Active ROM Right eval Left eval  Shoulder flexion  93  Shoulder extension    Shoulder abduction  81  Shoulder adduction    Shoulder internal rotation  WNL  Shoulder external rotation  75  Elbow flexion    Elbow extension    Wrist flexion    Wrist extension    Wrist ulnar deviation    Wrist radial deviation    Wrist pronation    Wrist supination    (Blank rows = not tested)  UPPER EXTREMITY MMT: RUE strength WNL, L shoulder largely deferred due to pain, able to elevate against gravity in flexion and abd, elbow, wrist WNL   SHOULDER SPECIAL TESTS: Impingement tests: Hawkins/Kennedy impingement test: negative and Painful arc test: positive  Rotator cuff assessment: Drop arm test: negative and Empty can test: positive  Biceps assessment: Yergason's test: positive   JOINT MOBILITY TESTING:  L humerus limited mobility in all planes.  PALPATION:  TTP along top of joint line on L shoulder, at [facility] joint, post/inferior joint capsule, upper traps, with TP   TODAY'S TREATMENT:  DATE:  03/28/22 Education  PATIENT EDUCATION: Education details:  POC Person educated: Patient Education method: Explanation Education comprehension: verbalized understanding  HOME EXERCISE PROGRAM: TBD  ASSESSMENT:  CLINICAL IMPRESSION: Patient is a 89 y.o. who was seen today for physical therapy evaluation and treatment for L shoulder pain. X ray shows some AC joint deterioration and she was told she has RC injury due to old age. She demonstrates weakness in the L shoulder, limited ROM due to pain, showed (+) shoulder impingement and (+) to [facility] assessment. She also shows biceps impingement. Patient recently had an injection which has helped a lot with the pain, but she had a bad reaction after the injection with Increased BP and racing heart. She would prefer to avoid additional injections. She was initially reluctant to participate in therapy due to fear of making the pain worse. Therapist educated patient to the rationale for building scapular stability, shoulder and trunk strength to improve the mechanical dynamics at the shoulder during activity. 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 After education to rationale for PT to decrease risk of continued pain and further injury, patient was agreeable to proceed with PT for strength, stretch, acute pain treatments.  OBJECTIVE IMPAIRMENTS: decreased ROM, decreased strength, increased muscle spasms, impaired flexibility, improper body mechanics, postural dysfunction, and pain.   ACTIVITY LIMITATIONS: carrying, lifting, sleeping, reach over head, and caring for others  PARTICIPATION LIMITATIONS: meal prep, cleaning, laundry, shopping, and community activity  PERSONAL FACTORS: Age are also affecting patient's functional outcome.   REHAB POTENTIAL: Good  CLINICAL DECISION MAKING: Evolving/moderate complexity  EVALUATION COMPLEXITY: Moderate   GOALS: Goals reviewed with patient? Yes  SHORT TERM GOALS: Target date: 04/11/22  I with initial HEP Baseline: Goal  status: INITIAL  LONG TERM GOALS: Target date: 06/20/22  I with final HEP Baseline:  Goal status: INITIAL  2.  Patient will demonstrate improved L active shoulder flexion to at least 110 degrees, abd to at least 100 degrees with pain < 3/10 Baseline: 93 degrees flexion, 81 abd Goal status: INITIAL  3.  Patient will be able to perform her normal daily activities with L shoulder pain < 3/10 Baseline: up to 10/10 prior to her recent injection Goal status: INITIAL  4.  Patient will be able to sleep through the night with L shoulder pain < 3/10 Baseline:  Goal status: INITIAL PLAN:  PT FREQUENCY: 1-2x/week  PT DURATION: 12 weeks  PLANNED INTERVENTIONS: Therapeutic exercises, Therapeutic activity, Neuromuscular re-education, Balance training, Gait training, Patient/Family education, Self Care, Joint mobilization, Dry Needling, Electrical stimulation, Cryotherapy, Moist heat, Taping, Vasopneumatic device, Ultrasound, Ionotophoresis '4mg'$ /ml Dexamethasone, and Manual therapy  PLAN FOR NEXT SESSION: Initiate HEP.   Marcelina Morel, DPT 03/28/2022, 3:47 PM

## 2022-03-28 ENCOUNTER — Encounter: Payer: Self-pay | Admitting: Physical Therapy

## 2022-03-28 ENCOUNTER — Ambulatory Visit: Payer: Medicare Other | Attending: Sports Medicine | Admitting: Physical Therapy

## 2022-03-28 DIAGNOSIS — G8929 Other chronic pain: Secondary | ICD-10-CM | POA: Diagnosis not present

## 2022-03-28 DIAGNOSIS — M6281 Muscle weakness (generalized): Secondary | ICD-10-CM | POA: Diagnosis not present

## 2022-03-28 DIAGNOSIS — R293 Abnormal posture: Secondary | ICD-10-CM | POA: Diagnosis not present

## 2022-03-28 DIAGNOSIS — M25512 Pain in left shoulder: Secondary | ICD-10-CM | POA: Diagnosis not present

## 2022-03-28 DIAGNOSIS — R278 Other lack of coordination: Secondary | ICD-10-CM | POA: Insufficient documentation

## 2022-04-01 NOTE — Progress Notes (Unsigned)
    Benito Mccreedy D.[place] Phone: [phone]   Assessment and Plan:     There are no diagnoses linked to this encounter.  ***   Pertinent previous records reviewed include ***   Follow Up: ***     Subjective:   I, Ainsley Sanguinetti, am serving as a Education administrator for Doctor Glennon Mac   Chief Complaint: left shoulder pain    HPI:    03/07/2022 Patient is a 89 year old female complaining of left shoulder pain. Patient states that she has had pain for several weeks, pain is shoulder down to her elbow, no MOI, intermittent tylenol use , decreases ROM due to pain,she is able to lift things but it causes pain, no numbness or tingling , nothing in the head or neck , has a hard time sleeping at night, isnt able to get comfortable, she is a side sleeper , has tried aspercream but doesn't last long , she had pain with her last exam    04/02/2022 Patient states    Relevant Historical Information: Hypertension, RAS  Additional pertinent review of systems negative.   Current Outpatient Medications:    acetaminophen (TYLENOL) 650 MG CR tablet, Take 650 mg by mouth 2 (two) times daily. Reported on 02/27/2015, Disp: , Rfl:    amLODipine (NORVASC) 5 MG tablet, TAKE 1 TABLET BY MOUTH TWICE A DAY, Disp: 180 tablet, Rfl: 3   atorvastatin (LIPITOR) 20 MG tablet, TAKE 1/2 TABLET BY MOUTH DAILY, Disp: 45 tablet, Rfl: 1   COVID-19 mRNA vaccine 2023-2024 (COMIRNATY) syringe, Inject into the muscle., Disp: 0.3 mL, Rfl: 0   hydrochlorothiazide (MICROZIDE) 12.5 MG capsule, TAKE 1 CAPSULE BY MOUTH EVERY DAY, Disp: 90 capsule, Rfl: 1   labetalol (NORMODYNE) 100 MG tablet, TAKE 1 TABLET BY MOUTH TWICE A DAY, Disp: 180 tablet, Rfl: 3   lisinopril (ZESTRIL) 40 MG tablet, TAKE 1 TABLET BY MOUTH EVERY DAY, Disp: 90 tablet, Rfl: 1   Objective:     There were no vitals filed for this visit.    There is no height or weight on file to  calculate BMI.    Physical Exam:    ***   Electronically signed by:  Benito Mccreedy D.[facility] 12:03 PM 04/01/22

## 2022-04-02 ENCOUNTER — Ambulatory Visit (INDEPENDENT_AMBULATORY_CARE_PROVIDER_SITE_OTHER): Payer: Medicare Other | Admitting: Sports Medicine

## 2022-04-02 VITALS — BP 120/78 | HR 71 | Ht <= 58 in | Wt 133.0 lb

## 2022-04-02 DIAGNOSIS — M25512 Pain in left shoulder: Secondary | ICD-10-CM | POA: Diagnosis not present

## 2022-04-02 DIAGNOSIS — M7581 Other shoulder lesions, right shoulder: Secondary | ICD-10-CM | POA: Diagnosis not present

## 2022-04-02 NOTE — Patient Instructions (Addendum)
Good to see you Continue PT and HEP  As needed follow up

## 2022-04-04 ENCOUNTER — Ambulatory Visit: Payer: Medicare Other | Attending: Sports Medicine | Admitting: Physical Therapy

## 2022-04-04 ENCOUNTER — Encounter: Payer: Self-pay | Admitting: Physical Therapy

## 2022-04-04 DIAGNOSIS — G8929 Other chronic pain: Secondary | ICD-10-CM | POA: Insufficient documentation

## 2022-04-04 DIAGNOSIS — M6281 Muscle weakness (generalized): Secondary | ICD-10-CM | POA: Diagnosis not present

## 2022-04-04 DIAGNOSIS — R293 Abnormal posture: Secondary | ICD-10-CM | POA: Insufficient documentation

## 2022-04-04 DIAGNOSIS — R278 Other lack of coordination: Secondary | ICD-10-CM | POA: Diagnosis not present

## 2022-04-04 DIAGNOSIS — M25512 Pain in left shoulder: Secondary | ICD-10-CM | POA: Diagnosis not present

## 2022-04-04 NOTE — Therapy (Signed)
OUTPATIENT PHYSICAL THERAPY SHOULDER EVALUATION   Patient Name: MONEE SIVERSON MRN: [id number] DOB:Aug 15, 1933, 89 y.o., female Today's Date: 04/04/2022  END OF SESSION:  PT End of Session - 04/04/22 1254     Visit Number 2    Date for PT Re-Evaluation 06/20/22    PT Start Time 1255    PT Stop Time 1340    PT Time Calculation (min) 45 min    Activity Tolerance Patient tolerated treatment well    Behavior During Therapy [facility] for tasks assessed/performed             Past Medical History:  Diagnosis Date   Allergy    Basal cell carcinoma    HX: breast cancer    Hyperlipidemia    Hypertension    Osteopenia    Past Surgical History:  Procedure Laterality Date   ABDOMINAL HYSTERECTOMY  1983   BREAST SURGERY     mastectomy   CHOLECYSTECTOMY  1993   COLONOSCOPY     VEIN SURGERY     Patient Active Problem List   Diagnosis Date Noted   Vitamin D deficiency 03/05/2022   Ruptured Bakers cyst 01/13/2018   Skin cancer, basal cell 04/20/2015   Lumbar degenerative disc disease 05/23/2014   Arthritis of left hip 05/23/2014   Greater trochanteric bursitis of right hip 05/23/2014   Renal artery stenosis ([place]) 12/15/2013   Hyperlipidemia 09/10/2006   Essential hypertension 08/18/2006   Allergic rhinitis 08/18/2006   Osteopenia 08/18/2006   BREAST CANCER, HX OF 08/18/2006    PCP: Marin Olp, MD  REFERRING PROVIDER: Glennon Mac, DO  REFERRING DIAG: [phone] (ICD-10-CM) - Left shoulder pain, unspecified chronicity   THERAPY DIAG:  Chronic left shoulder pain  Muscle weakness (generalized)  Abnormal posture  Rationale for Evaluation and Treatment: Rehabilitation  ONSET DATE: 03/07/22  SUBJECTIVE:                                                                                                                                                                                      SUBJECTIVE STATEMENT: Doing ok, L shoulder hurts with certain movements  PERTINENT  HISTORY: 1. Acute pain of left shoulder 2. Right rotator cuff tendonitis  -Acute, unchanged, initial sports medicine visit - Acute left shoulder pain most consistent with rotator cuff tendinitis versus impingement syndrome with restricted ROM and no significant MOI - Patient elected for subacromial CSI.  Tolerated well per note below.  Instructed that corticosteroid may temporarily increase blood pressure in patient with hypertension  - Start HEP and physical therapy for shoulder and rotator cuff   Procedure: Subacromial Injection Side: Left  Left shoulder:  No deformity, swelling  General muscle wasting, similar bilaterally and expected for age No scapular winging FF 80, abd 90, int 10, ext 70 TTP globally, though worse at [facility] joint, deltoid, biceps groove Difficult to perform special testing due to patient's limited ROM and tenderness Negative Spurling's test bilat FROM of neck   PAIN:  Are you having pain? Yes: NPRS scale: 0/10 Pain location: L shoulder, radiates into her upper arm Pain description: sharp Aggravating factors: Lifting and reaching, rolling onto the arm at night in her sleep. Relieving factors: Injection was effective. Pain was up to 10/10 prior to injection.  PRECAUTIONS: None  WEIGHT BEARING RESTRICTIONS: No  FALLS:  Has patient fallen in last 6 months? 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 He does not need physical A to move.  PATIENT GOALS:She wants to be sure that her pain remains gone.  NEXT MD VISIT:   OBJECTIVE:   DIAGNOSTIC FINDINGS:  - X-ray obtained in clinic.  My interpretation: No acute fracture or dislocation.  Minimal changes in glenohumeral joint.   Moderate cortical changes at [facility] joint  COGNITION: Overall cognitive status: Within functional limits for tasks assessed     SENSATION: WFL  POSTURE: Rounded shoulders, slightly forward head, increased thoracic kyphosis  UPPER EXTREMITY ROM:   Active ROM Right eval Left eval  Shoulder flexion  93  Shoulder extension    Shoulder abduction  81  Shoulder adduction    Shoulder internal rotation  WNL  Shoulder external rotation  75  Elbow flexion    Elbow extension    Wrist flexion    Wrist extension    Wrist ulnar deviation    Wrist radial deviation    Wrist pronation    Wrist supination    (Blank rows = not tested)  UPPER EXTREMITY MMT: RUE strength WNL, L shoulder largely deferred due to pain, able to elevate against gravity in flexion and abd, elbow, wrist WNL   SHOULDER SPECIAL TESTS: Impingement tests: Hawkins/Kennedy impingement test: negative and Painful arc test: positive  Rotator cuff assessment: Drop arm test: negative and Empty can test: positive  Biceps assessment: Yergason's test: positive   JOINT MOBILITY TESTING:  L humerus limited mobility in all planes.  PALPATION:  TTP along top of joint line on L shoulder, at [facility] joint, post/inferior joint capsule, upper traps, with TP   TODAY'S TREATMENT:                                                                                                                                         DATE:  04/04/22 UBE L 1 x 2 min each AAROM 1lb WaTE bar Flex, Ext,  IR up back  Rows green 2x1o Shoulder Ext yellow 2x10 Shoulder IR yellow RUE 2x10 Shoulder flex and abd 1lb 2x10 each Bicep curls 2lb 2x10 LUE PROM with end range holds in all directions.   03/28/22 Education  PATIENT EDUCATION: Education details: POC Person educated: Patient Education method: Explanation Education comprehension: verbalized understanding  HOME EXERCISE PROGRAM: TBD  ASSESSMENT:  CLINICAL IMPRESSION: Patient is a 89 y.o. who was seen  today for physical therapy treatment for L shoulder pain. X ray shows some AC joint deterioration and she was told she has RC injury due to old age. Session consisted of a introduction to light strengthening and stretching. Pt has forward shoulder with rounde posture at rest. Some discomfort reported with all abduction activities both passively and actively. Tactiel cue to emphasize correct posture needed with rows and extensions. PT to decrease risk of continued pain and further injury, patient was agreeable to proceed with PT for strength, stretch, acute pain treatments.  OBJECTIVE IMPAIRMENTS: decreased ROM, decreased strength, increased muscle spasms, impaired flexibility, improper body mechanics, postural dysfunction, and pain.   ACTIVITY LIMITATIONS: carrying, lifting, sleeping, reach over head, and caring for others  PARTICIPATION LIMITATIONS: meal prep, cleaning, laundry, shopping, and community activity  PERSONAL FACTORS: Age are also affecting patient's functional outcome.   REHAB POTENTIAL: Good  CLINICAL DECISION MAKING: Evolving/moderate complexity  EVALUATION COMPLEXITY: Moderate   GOALS: Goals reviewed with patient? Yes  SHORT TERM GOALS: Target date: 04/11/22  I with initial HEP Baseline: Goal status: INITIAL  LONG TERM GOALS: Target date: 06/20/22  I with final HEP Baseline:  Goal status: INITIAL  2.  Patient will demonstrate improved L active shoulder flexion to at least 110 degrees, abd to at least 100 degrees with pain < 3/10 Baseline: 93 degrees flexion, 81 abd Goal status: INITIAL  3.  Patient will be able to perform her normal daily activities with L shoulder pain < 3/10 Baseline: up to 10/10 prior to her recent injection Goal status: INITIAL  4.  Patient will be able to sleep through the night with L shoulder pain < 3/10 Baseline:  Goal status: INITIAL PLAN:  PT FREQUENCY: 1-2x/week  PT DURATION: 12 weeks  PLANNED INTERVENTIONS: Therapeutic  exercises, Therapeutic activity, Neuromuscular re-education, Balance training, Gait training, Patient/Family education, Self Care, Joint mobilization, Dry Needling, Electrical stimulation, Cryotherapy, Moist heat, Taping, Vasopneumatic device, Ultrasound, Ionotophoresis '4mg'$ /ml Dexamethasone, and Manual therapy  PLAN FOR NEXT SESSION: Initiate HEP.   Marcelina Morel, DPT 04/04/2022, 12:55 PM

## 2022-04-10 ENCOUNTER — Ambulatory Visit: Payer: Medicare Other | Admitting: Physical Therapy

## 2022-04-10 ENCOUNTER — Encounter: Payer: Self-pay | Admitting: Physical Therapy

## 2022-04-10 DIAGNOSIS — G8929 Other chronic pain: Secondary | ICD-10-CM

## 2022-04-10 DIAGNOSIS — M6281 Muscle weakness (generalized): Secondary | ICD-10-CM | POA: Diagnosis not present

## 2022-04-10 DIAGNOSIS — R278 Other lack of coordination: Secondary | ICD-10-CM

## 2022-04-10 DIAGNOSIS — R293 Abnormal posture: Secondary | ICD-10-CM | POA: Diagnosis not present

## 2022-04-10 DIAGNOSIS — M25512 Pain in left shoulder: Secondary | ICD-10-CM | POA: Diagnosis not present

## 2022-04-10 NOTE — Therapy (Signed)
OUTPATIENT PHYSICAL THERAPY SHOULDER EVALUATION   Patient Name: Jane Cox MRN: [id number] DOB:[birth year], 89 y.o., female Today's Date: 04/10/2022  END OF SESSION:  PT End of Session - 04/10/22 1502     Visit Number 3    Date for PT Re-Evaluation 06/20/22    PT Start Time 1455    PT Stop Time 1540    PT Time Calculation (min) 45 min    Activity Tolerance Patient tolerated treatment well    Behavior During Therapy [facility] for tasks assessed/performed              Past Medical History:  Diagnosis Date   Allergy    Basal cell carcinoma    HX: breast cancer    Hyperlipidemia    Hypertension    Osteopenia    Past Surgical History:  Procedure Laterality Date   ABDOMINAL HYSTERECTOMY  1983   BREAST SURGERY     mastectomy   CHOLECYSTECTOMY  1993   COLONOSCOPY     VEIN SURGERY     Patient Active Problem List   Diagnosis Date Noted   Vitamin D deficiency 03/05/2022   Ruptured Bakers cyst 01/13/2018   Skin cancer, basal cell 04/20/2015   Lumbar degenerative disc disease 05/23/2014   Arthritis of left hip 05/23/2014   Greater trochanteric bursitis of right hip 05/23/2014   Renal artery stenosis ([place]) 12/15/2013   Hyperlipidemia 09/10/2006   Essential hypertension 08/18/2006   Allergic rhinitis 08/18/2006   Osteopenia 08/18/2006   BREAST CANCER, HX OF 08/18/2006    PCP: Marin Olp, MD  REFERRING PROVIDER: Glennon Mac, DO  REFERRING DIAG: [phone] (ICD-10-CM) - Left shoulder pain, unspecified chronicity   THERAPY DIAG:  Chronic left shoulder pain  Muscle weakness (generalized)  Abnormal posture  Other lack of coordination  Rationale for Evaluation and Treatment: Rehabilitation  ONSET DATE: 03/07/22  SUBJECTIVE:                                                                                                                                                                                      SUBJECTIVE STATEMENT: Patient reports she is doing  ok. She is fearful of wearing out her R shoulder due to overuse.   PERTINENT HISTORY: 1. Acute pain of left shoulder 2. Right rotator cuff tendonitis  -Acute, unchanged, initial sports medicine visit - Acute left shoulder pain most consistent with rotator cuff tendinitis versus impingement syndrome with restricted ROM and no significant MOI - Patient elected for subacromial CSI.  Tolerated well per note below.  Instructed that corticosteroid may temporarily increase blood pressure in patient with hypertension  - Start HEP and physical therapy for shoulder  and rotator cuff   Procedure: Subacromial Injection Side: Left  Left shoulder:  No deformity, swelling General muscle wasting, similar bilaterally and expected for age No scapular winging FF 80, abd 90, int 10, ext 70 TTP globally, though worse at [facility] joint, deltoid, biceps groove Difficult to perform special testing due to patient's limited ROM and tenderness Negative Spurling's test bilat FROM of neck   PAIN:  Are you having pain? Yes: NPRS scale: 0/10 Pain location: L shoulder, radiates into her upper arm Pain description: sharp Aggravating factors: Lifting and reaching, rolling onto the arm at night in her sleep. Relieving factors: Injection was effective. Pain was up to 10/10 prior to injection.  PRECAUTIONS: None  WEIGHT BEARING RESTRICTIONS: No  FALLS:  Has patient fallen in last 6 months? 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 He does not need physical A to move.  PATIENT GOALS:She wants to be sure that her pain remains gone.  NEXT MD VISIT:   OBJECTIVE:   DIAGNOSTIC FINDINGS:  - X-ray obtained in clinic.  My interpretation:  No acute fracture or dislocation.  Minimal changes in glenohumeral joint.  Moderate cortical changes at [facility] joint  COGNITION: Overall cognitive status: Within functional limits for tasks assessed     SENSATION: WFL  POSTURE: Rounded shoulders, slightly forward head, increased thoracic kyphosis  UPPER EXTREMITY ROM:   Active ROM Right eval Left eval  Shoulder flexion  93  Shoulder extension    Shoulder abduction  81  Shoulder adduction    Shoulder internal rotation  WNL  Shoulder external rotation  75  Elbow flexion    Elbow extension    Wrist flexion    Wrist extension    Wrist ulnar deviation    Wrist radial deviation    Wrist pronation    Wrist supination    (Blank rows = not tested)  UPPER EXTREMITY MMT: RUE strength WNL, L shoulder largely deferred due to pain, able to elevate against gravity in flexion and abd, elbow, wrist WNL   SHOULDER SPECIAL TESTS: Impingement tests: Hawkins/Kennedy impingement test: negative and Painful arc test: positive  Rotator cuff assessment: Drop arm test: negative and Empty can test: positive  Biceps assessment: Yergason's test: positive   JOINT MOBILITY TESTING:  L humerus limited mobility in all planes.  PALPATION:  TTP along top of joint line on L shoulder, at [facility] joint, post/inferior joint capsule, upper traps, with TP   TODAY'S TREATMENT:  DATE:  04/10/22 UBE 1 x 3 min forward and back Education on body mechanics while performing housework, using weight shifts instead of reaching with RUE as she is concerned her R shoulder will start hurting. AAROM for B shoulders using dowel-flexion, abd, ext, IR/ER 10 reps each Standing shoulder ext, Rows, ER, red Tband- 2 x 10 reps Seated chin tucks against ball on wall, 10 reps mod VC and TC Seated scapular retraction x 10 Seated shoulder ext with hands  behind hips-patient was not able to accurately perform, so deferred.  04/04/22 UBE L 1 x 2 min each AAROM 1lb WaTE bar Flex, Ext, IR up back  Rows green 2x1o Shoulder Ext yellow 2x10 Shoulder IR yellow RUE 2x10 Shoulder flex and abd 1lb 2x10 each Bicep curls 2lb 2x10 LUE PROM with end range holds in all directions.   03/28/22 Education  PATIENT EDUCATION: Education details: POC Person educated: Patient Education method: Explanation Education comprehension: verbalized understanding  HOME EXERCISE PROGRAM: [id number]  ASSESSMENT:  CLINICAL IMPRESSION: Patient is fearful of injuring her R shoulder. Educated her to modifications to housework to decrease strain on either shoulder. Initiated HEP, patient performed each exercise for strength in shoulders, mobility, and postural stability.  OBJECTIVE IMPAIRMENTS: decreased ROM, decreased strength, increased muscle spasms, impaired flexibility, improper body mechanics, postural dysfunction, and pain.   ACTIVITY LIMITATIONS: carrying, lifting, sleeping, reach over head, and caring for others  PARTICIPATION LIMITATIONS: meal prep, cleaning, laundry, shopping, and community activity  PERSONAL FACTORS: Age are also affecting patient's functional outcome.   REHAB POTENTIAL: Good  CLINICAL DECISION MAKING: Evolving/moderate complexity  EVALUATION COMPLEXITY: Moderate   GOALS: Goals reviewed with patient? Yes  SHORT TERM GOALS: Target date: 04/11/22  I with initial HEP Baseline: Goal status: 04/10/22- provided  LONG TERM GOALS: Target date: 06/20/22  I with final HEP Baseline:  Goal status: INITIAL  2.  Patient will demonstrate improved L active shoulder flexion to at least 110 degrees, abd to at least 100 degrees with pain < 3/10 Baseline: 93 degrees flexion, 81 abd Goal status: INITIAL  3.  Patient will be able to perform her normal daily activities with L shoulder pain < 3/10 Baseline: up to 10/10 prior to her recent  injection Goal status: INITIAL  4.  Patient will be able to sleep through the night with L shoulder pain < 3/10 Baseline:  Goal status: INITIAL PLAN:  PT FREQUENCY: 1-2x/week  PT DURATION: 12 weeks  PLANNED INTERVENTIONS: Therapeutic exercises, Therapeutic activity, Neuromuscular re-education, Balance training, Gait training, Patient/Family education, Self Care, Joint mobilization, Dry Needling, Electrical stimulation, Cryotherapy, Moist heat, Taping, Vasopneumatic device, Ultrasound, Ionotophoresis '4mg'$ /ml Dexamethasone, and Manual therapy  PLAN FOR NEXT SESSION: Initiate HEP.   Marcelina Morel, DPT 04/10/2022, 3:48 PM

## 2022-04-12 ENCOUNTER — Other Ambulatory Visit: Payer: Self-pay | Admitting: Family Medicine

## 2022-04-17 ENCOUNTER — Ambulatory Visit: Payer: Medicare Other | Admitting: Physical Therapy

## 2022-04-17 ENCOUNTER — Encounter: Payer: Self-pay | Admitting: Physical Therapy

## 2022-04-17 DIAGNOSIS — M6281 Muscle weakness (generalized): Secondary | ICD-10-CM

## 2022-04-17 DIAGNOSIS — R278 Other lack of coordination: Secondary | ICD-10-CM

## 2022-04-17 DIAGNOSIS — R293 Abnormal posture: Secondary | ICD-10-CM

## 2022-04-17 DIAGNOSIS — M25512 Pain in left shoulder: Secondary | ICD-10-CM | POA: Diagnosis not present

## 2022-04-17 DIAGNOSIS — G8929 Other chronic pain: Secondary | ICD-10-CM | POA: Diagnosis not present

## 2022-04-17 NOTE — Therapy (Signed)
OUTPATIENT PHYSICAL THERAPY SHOULDER EVALUATION   Patient Name: Jane Cox MRN: [id number] DOB:1933-07-12, 89 y.o., female Today's Date: 04/17/2022  END OF SESSION:  PT End of Session - 04/17/22 1504     Visit Number 4    Date for PT Re-Evaluation 06/20/22    PT Start Time 1458    PT Stop Time 1540    PT Time Calculation (min) 42 min    Activity Tolerance Patient tolerated treatment well    Behavior During Therapy [facility] for tasks assessed/performed              Past Medical History:  Diagnosis Date   Allergy    Basal cell carcinoma    HX: breast cancer    Hyperlipidemia    Hypertension    Osteopenia    Past Surgical History:  Procedure Laterality Date   ABDOMINAL HYSTERECTOMY  1983   BREAST SURGERY     mastectomy   CHOLECYSTECTOMY  1993   COLONOSCOPY     VEIN SURGERY     Patient Active Problem List   Diagnosis Date Noted   Vitamin D deficiency 03/05/2022   Ruptured Bakers cyst 01/13/2018   Skin cancer, basal cell 04/20/2015   Lumbar degenerative disc disease 05/23/2014   Arthritis of left hip 05/23/2014   Greater trochanteric bursitis of right hip 05/23/2014   Renal artery stenosis ([place]) 12/15/2013   Hyperlipidemia 09/10/2006   Essential hypertension 08/18/2006   Allergic rhinitis 08/18/2006   Osteopenia 08/18/2006   BREAST CANCER, HX OF 08/18/2006    PCP: Marin Olp, MD  REFERRING PROVIDER: Glennon Mac, DO  REFERRING DIAG: [phone] (ICD-10-CM) - Left shoulder pain, unspecified chronicity   THERAPY DIAG:  Chronic left shoulder pain  Muscle weakness (generalized)  Abnormal posture  Other lack of coordination  Rationale for Evaluation and Treatment: Rehabilitation  ONSET DATE: 03/07/22  SUBJECTIVE:                                                                                                                                                                                      SUBJECTIVE STATEMENT: Patient reports she is doing  ok. She had some questions about her HEP and reports some hip soreness after performing, but it went away the next day.   PERTINENT HISTORY: 1. Acute pain of left shoulder 2. Right rotator cuff tendonitis  -Acute, unchanged, initial sports medicine visit - Acute left shoulder pain most consistent with rotator cuff tendinitis versus impingement syndrome with restricted ROM and no significant MOI - Patient elected for subacromial CSI.  Tolerated well per note below.  Instructed that corticosteroid may temporarily increase blood pressure in patient with hypertension  -  Start HEP and physical therapy for shoulder and rotator cuff   Procedure: Subacromial Injection Side: Left  Left shoulder:  No deformity, swelling General muscle wasting, similar bilaterally and expected for age No scapular winging FF 80, abd 90, int 10, ext 70 TTP globally, though worse at [facility] joint, deltoid, biceps groove Difficult to perform special testing due to patient's limited ROM and tenderness Negative Spurling's test bilat FROM of neck   PAIN:  Are you having pain? Yes: NPRS scale: 0/10 Pain location: L shoulder, radiates into her upper arm Pain description: sharp Aggravating factors: Lifting and reaching, rolling onto the arm at night in her sleep. Relieving factors: Injection was effective. Pain was up to 10/10 prior to injection.  PRECAUTIONS: None  WEIGHT BEARING RESTRICTIONS: No  FALLS:  Has patient fallen in last 6 months? 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 He does not need physical A to move.  PATIENT GOALS:She wants to be sure that her pain remains gone.  NEXT MD VISIT:   OBJECTIVE:   DIAGNOSTIC  FINDINGS:  - X-ray obtained in clinic.  My interpretation: No acute fracture or dislocation.  Minimal changes in glenohumeral joint.  Moderate cortical changes at [facility] joint  COGNITION: Overall cognitive status: Within functional limits for tasks assessed     SENSATION: WFL  POSTURE: Rounded shoulders, slightly forward head, increased thoracic kyphosis  UPPER EXTREMITY ROM:   Active ROM Right eval Left eval  Shoulder flexion  93  Shoulder extension    Shoulder abduction  81  Shoulder adduction    Shoulder internal rotation  WNL  Shoulder external rotation  75  Elbow flexion    Elbow extension    Wrist flexion    Wrist extension    Wrist ulnar deviation    Wrist radial deviation    Wrist pronation    Wrist supination    (Blank rows = not tested)  UPPER EXTREMITY MMT: RUE strength WNL, L shoulder largely deferred due to pain, able to elevate against gravity in flexion and abd, elbow, wrist WNL   SHOULDER SPECIAL TESTS: Impingement tests: Hawkins/Kennedy impingement test: negative and Painful arc test: positive  Rotator cuff assessment: Drop arm test: negative and Empty can test: positive  Biceps assessment: Yergason's test: positive   JOINT MOBILITY TESTING:  L humerus limited mobility in all planes.  PALPATION:  TTP along top of joint line on L shoulder, at [facility] joint, post/inferior joint capsule, upper traps, with TP   TODAY'S TREATMENT:  DATE:  04/17/22 UBE L1 x 3 minutes forward and back AAROM with 2# dowel, IR/ER, flex, abd to each side, ext, 10 each B shoulder ER against Red Tband x 15 reps Wall slides flex, scaption, abd, 10 reps each arm, each side. Scapular protraction against Red Tband, 2 x 10 reps Seated scapular retraction, 5 sec holds x 10 Standing with hands on top of physioball on mat, push down with hands, rolling x 10  in each direction. 3 way shoulder elevation with 1# weights, x 10 each direction. Stretching-Baby Eagle, Upper Traps  04/10/22 UBE 1 x 3 min forward and back Education on body mechanics while performing housework, using weight shifts instead of reaching with RUE as she is concerned her R shoulder will start hurting. AAROM for B shoulders using dowel-flexion, abd, ext, IR/ER 10 reps each Standing shoulder ext, Rows, ER, red Tband- 2 x 10 reps Seated chin tucks against ball on wall, 10 reps mod VC and TC Seated scapular retraction x 10 Seated shoulder ext with hands behind hips-patient was not able to accurately perform, so deferred.  04/04/22 UBE L 1 x 2 min each AAROM 1lb WaTE bar Flex, Ext, IR up back  Rows green 2x1o Shoulder Ext yellow 2x10 Shoulder IR yellow RUE 2x10 Shoulder flex and abd 1lb 2x10 each Bicep curls 2lb 2x10 LUE PROM with end range holds in all directions.   03/28/22 Education  PATIENT EDUCATION: Education details: POC Person educated: Patient Education method: Explanation Education comprehension: verbalized understanding  HOME EXERCISE PROGRAM: [id number]  ASSESSMENT:  CLINICAL IMPRESSION: Patient arrived with questions about her Hep. Therapist demonstrated, patient returned demo of each to answer her questions. Progressed with strengthening for B shoulders and upper traps followed by some stretching at the end. HEP remains unchanged in order to allow her to practice and become familiar with the exercises she currently has. She will not be here next week. Plan is to progress HEP upon return.  OBJECTIVE IMPAIRMENTS: decreased ROM, decreased strength, increased muscle spasms, impaired flexibility, improper body mechanics, postural dysfunction, and pain.   ACTIVITY LIMITATIONS: carrying, lifting, sleeping, reach over head, and caring for others  PARTICIPATION LIMITATIONS: meal prep, cleaning, laundry, shopping, and community activity  PERSONAL FACTORS: Age  are also affecting patient's functional outcome.   REHAB POTENTIAL: Good  CLINICAL DECISION MAKING: Evolving/moderate complexity  EVALUATION COMPLEXITY: Moderate   GOALS: Goals reviewed with patient? Yes  SHORT TERM GOALS: Target date: 04/11/22  I with initial HEP Baseline: Goal status: 04/17/22- reviewed, ongoing  LONG TERM GOALS: Target date: 06/20/22  I with final HEP Baseline:  Goal status: INITIAL  2.  Patient will demonstrate improved L active shoulder flexion to at least 110 degrees, abd to at least 100 degrees with pain < 3/10 Baseline: 93 degrees flexion, 81 abd Goal status: INITIAL  3.  Patient will be able to perform her normal daily activities with L shoulder pain < 3/10 Baseline: up to 10/10 prior to her recent injection Goal status: 04/17/22-progressing, ongoing  4.  Patient will be able to sleep through the night with L shoulder pain < 3/10 Baseline:  Goal status: INITIAL PLAN:  PT FREQUENCY: 1-2x/week  PT DURATION: 12 weeks  PLANNED INTERVENTIONS: Therapeutic exercises, Therapeutic activity, Neuromuscular re-education, Balance training, Gait training, Patient/Family education, Self Care, Joint mobilization, Dry Needling, Electrical stimulation, Cryotherapy, Moist heat, Taping, Vasopneumatic device, Ultrasound, Ionotophoresis 4mg /ml Dexamethasone, and Manual therapy  PLAN FOR NEXT SESSION: Update HEP.   Marcelina Morel, DPT 04/17/2022, 3:46 PM

## 2022-04-22 ENCOUNTER — Ambulatory Visit: Payer: Medicare Other | Admitting: Physical Therapy

## 2022-04-26 DIAGNOSIS — R040 Epistaxis: Secondary | ICD-10-CM | POA: Diagnosis not present

## 2022-04-26 DIAGNOSIS — I10 Essential (primary) hypertension: Secondary | ICD-10-CM | POA: Diagnosis not present

## 2022-05-02 ENCOUNTER — Ambulatory Visit: Payer: Medicare Other | Admitting: Physical Therapy

## 2022-05-06 ENCOUNTER — Ambulatory Visit (INDEPENDENT_AMBULATORY_CARE_PROVIDER_SITE_OTHER): Payer: Medicare Other | Admitting: Family Medicine

## 2022-05-06 ENCOUNTER — Encounter: Payer: Self-pay | Admitting: Family Medicine

## 2022-05-06 VITALS — BP 164/70 | HR 62 | Temp 97.9°F | Ht <= 58 in | Wt 131.2 lb

## 2022-05-06 DIAGNOSIS — I10 Essential (primary) hypertension: Secondary | ICD-10-CM

## 2022-05-06 DIAGNOSIS — E559 Vitamin D deficiency, unspecified: Secondary | ICD-10-CM | POA: Diagnosis not present

## 2022-05-06 DIAGNOSIS — R040 Epistaxis: Secondary | ICD-10-CM

## 2022-05-06 MED ORDER — VITAMIN D (ERGOCALCIFEROL) 1.25 MG (50000 UNIT) PO CAPS: 50000.0000 [IU] | ORAL_CAPSULE | ORAL | 0 refills | Status: DC

## 2022-05-06 NOTE — Progress Notes (Signed)
Phone [phone] In person visit   Subjective:   Jane Cox is a 89 y.o. year old very pleasant female patient who presents for/with See problem oriented charting Chief Complaint  Patient presents with   Epistaxis    Pt c/o real bad nose bleed that happened on 03/29 that happened while cooking dinner and pt was evaluated in the UC.   Past Medical History-  Patient Active Problem List   Diagnosis Date Noted   Vitamin D deficiency 03/05/2022    Priority: Medium    Renal artery stenosis ([facility]) 12/15/2013    Priority: Medium    Hyperlipidemia 09/10/2006    Priority: Medium    Essential hypertension 08/18/2006    Priority: Medium    BREAST CANCER, HX OF 08/18/2006    Priority: Medium    Ruptured Bakers cyst 01/13/2018    Priority: Low   Skin cancer, basal cell 04/20/2015    Priority: Low   Lumbar degenerative disc disease 05/23/2014    Priority: Low   Arthritis of left hip 05/23/2014    Priority: Low   Greater trochanteric bursitis of right hip 05/23/2014    Priority: Low   Allergic rhinitis 08/18/2006    Priority: Low   Osteopenia 08/18/2006    Priority: Low    Medications- reviewed and updated Current Outpatient Medications  Medication Sig Dispense Refill   acetaminophen (TYLENOL) 650 MG CR tablet Take 650 mg by mouth 2 (two) times daily. Reported on 02/27/2015     amLODipine (NORVASC) 5 MG tablet TAKE 1 TABLET BY MOUTH TWICE A DAY 180 tablet 3   atorvastatin (LIPITOR) 20 MG tablet TAKE 1/2 TABLET BY MOUTH DAILY 45 tablet 1   Ayr Saline Nasal No-Drip GEL Place into the nose.     hydrochlorothiazide (MICROZIDE) 12.5 MG capsule TAKE 1 CAPSULE BY MOUTH EVERY DAY 90 capsule 1   labetalol (NORMODYNE) 100 MG tablet TAKE 1 TABLET BY MOUTH TWICE A DAY 180 tablet 3   lisinopril (ZESTRIL) 40 MG tablet TAKE 1 TABLET BY MOUTH EVERY DAY 90 tablet 1   Vitamin D, Ergocalciferol, (DRISDOL) 1.25 MG (50000 UNIT) CAPS capsule Take 1 capsule (50,000 Units total) by mouth every 7  (seven) days. 13 capsule 0   No current facility-administered medications for this visit.     Objective:  BP (!) 164/70   Pulse 62   Temp 97.9 F (36.6 C)   Ht 4\' 9"  (1.448 m)   Wt 131 lb 3.2 oz (59.5 kg)   SpO2 97%   BMI 28.39 kg/m  Gen: NAD, resting comfortably Some dried nasal saline noted in nasal turbinates but otherwise normal-no obvious source of bleeding noted CV: RRR no murmurs rubs or gallops Lungs: CTAB no crackles, wheeze, rhonchi Ext: no edema Skin: warm, dry    Assessment and Plan   # Epistaxis S: Patient had a significant nosebleed on 04/26/2022-started while she was cooking dinner-reports was seen in urgent care. Downstairs of home is warm and dry. Sprayed nose with afrin every 15 minutes and plugged nose to get it to stop- thanfully no recurrence -does have runy nose at baseline on labetalol -Does not take anticoagulation or aspirin - Does not take Flonase  A/P: Patient with recent episode of epistaxis-was cooking downstairs in house was rather warm and dry-wonder if this contributed-appropriate treatment in urgent care and we discussed methods of recurrence-primarily holding the bridge of the nose for extended period and if absolutely has to and blood pressure is controlled can use Afrin  at home.  Also using nasal saline gel to prevent in the first place -She would like to check blood work to make sure no obvious cause of bleeding-check CBC, CMP, PTT and PT/INR  #hypertension #Renal artery stenosis-unilateral and should not cause blood pressure issues-was released by cardiology in 2018-she declines further imaging S: medication: Amlodipine 5 mg, hydrochlorothiazide 12.5 mg, labetalol twice daily, lisinopril 40 mg Home readings #s: 130/60 in am, 150/65 sometimes when she checks in afternoon BP Readings from Last 3 Encounters:  05/06/22 (!) 164/70  04/02/22 120/78  03/07/22 (!) 160/80   A/P: Blood pressure poorly controlled-we discussed increasing medicine but  she prefers to try to alter her regimen first  From AVS:  " Currently takes amlodipine, hydrochlorothiazide, labetalol and lisinopril in morning -labetalol again in the evening  She is going to trial hydrochlorothiazide, labetalol, lisinopril in morning -amlodipine with lunch -labetalol again in the evening -and update me with am and PM readings in 2 weeks by [employer]- may need additional medicine support "  #Vitamin D deficiency S: Medication: Has required high-dose vitamin D in past- back down to 1000 as 2000 caused upset stomach Last vitamin D Lab Results  Component Value Date   VD25OH 29.17 (L) 03/05/2022  A/P: Vitamin D slightly low-has not tolerated 2000 units a day but does tolerate 50,000 units a week so we opted for 13-week course and then resume 1000 units-hold off on repeat today since starting the regimen  Recommended follow up: Return for next already scheduled visit or sooner if needed. Future Appointments  Date Time Provider Department Center  09/03/2022  2:20 PM Shelva Majestic, MD [facility]    Lab/Order associations:   ICD-10-CM   1. Epistaxis  R04.0 CBC with Differential/Platelet    Comprehensive metabolic panel    Protime-INR    APTT    2. Essential hypertension  I10     3. Vitamin D deficiency  E55.9       Meds ordered this encounter  Medications   Vitamin D, Ergocalciferol, (DRISDOL) 1.25 MG (50000 UNIT) CAPS capsule    Sig: Take 1 capsule (50,000 Units total) by mouth every 7 (seven) days.    Dispense:  13 capsule    Refill:  0    Return precautions advised.  Tana Conch, MD

## 2022-05-06 NOTE — Patient Instructions (Addendum)
Let us know if you get anymore COVID vaccines.  Home blood pressure increases in afternoon  Currently takes amlodipine, hydrochlorothiazide, labetalol and lisinopril in morning -labetalol again in the evening  She is going to trial hydrochlorothiazide, labetalol, lisinopril in morning -amlodipine with lunch -labetalol again in the evening -and update me with am and PM readings in 2 weeks by mychart- may need additional medicine support  Please stop by lab before you go If you have mychart- we will send your results within 3 business days of [place] receiving them.  If you do not have mychart- we will call you about results within 5 business days of [place] receiving them.  *please also note that you will see labs on mychart as soon as they post. I will later go in and write notes on them- will say "notes from Dr. Durene Cal"   Your vitamin D is low. I have sent in high dose vitamin D for you to take for 13 weeks. After you complete high dose, please take 1000 units per day.  Recommended follow up: Return for next already scheduled visit or sooner if needed.

## 2022-05-06 NOTE — Assessment & Plan Note (Signed)
#  hypertension #Renal artery stenosis-unilateral and should not cause blood pressure issues-was released by cardiology in 2018-she declines further imaging S: medication: Amlodipine 5 mg, hydrochlorothiazide 12.5 mg, labetalol twice daily, lisinopril 40 mg Home readings #s: 130/60 in am, 150/65 sometimes when she checks in afternoon BP Readings from Last 3 Encounters:  05/06/22 (!) 198/70  04/02/22 120/78  03/07/22 (!) 160/80   A/P: Blood pressure poorly controlled-we discussed increasing medicine but she prefers to try to alter her regimen first  From AVS:  " Currently takes amlodipine, hydrochlorothiazide, labetalol and lisinopril in morning -labetalol again in the evening  She is going to trial hydrochlorothiazide, labetalol, lisinopril in morning -amlodipine with lunch -labetalol again in the evening -and update me with am and PM readings in 2 weeks by mychart- may need additional medicine support "

## 2022-05-06 NOTE — Assessment & Plan Note (Signed)
S: Medication: Has required high-dose vitamin D in past- back down to 1000 as 2000 caused upset stomach Last vitamin D Lab Results  Component Value Date   VD25OH 29.17 (L) 03/05/2022  A/P: Vitamin D slightly low-has not tolerated 2000 units a day but does tolerate 50,000 units a week so we opted for 13-week course and then resume 1000 units-hold off on repeat today since starting the regimen

## 2022-05-07 LAB — CBC WITH DIFFERENTIAL/PLATELET
Basophils Absolute: 0.1 10*3/uL (ref 0.0–0.1)
Basophils Relative: 1.2 % (ref 0.0–3.0)
Eosinophils Absolute: 0.1 10*3/uL (ref 0.0–0.7)
Eosinophils Relative: 1.5 % (ref 0.0–5.0)
HCT: 35.8 % — ABNORMAL LOW (ref 36.0–46.0)
Hemoglobin: 12.2 g/dL (ref 12.0–15.0)
Lymphocytes Relative: 30.2 % (ref 12.0–46.0)
Lymphs Abs: 2.2 10*3/uL (ref 0.7–4.0)
MCHC: 34 g/dL (ref 30.0–36.0)
MCV: 87.9 fl (ref 78.0–100.0)
Monocytes Absolute: 0.6 10*3/uL (ref 0.1–1.0)
Monocytes Relative: 8.7 % (ref 3.0–12.0)
Neutro Abs: 4.2 10*3/uL (ref 1.4–7.7)
Neutrophils Relative %: 58.4 % (ref 43.0–77.0)
Platelets: 284 10*3/uL (ref 150.0–400.0)
RBC: 4.07 Mil/uL (ref 3.87–5.11)
RDW: 14.5 % (ref 11.5–15.5)
WBC: 7.1 10*3/uL (ref 4.0–10.5)

## 2022-05-07 LAB — COMPREHENSIVE METABOLIC PANEL
ALT: 16 U/L (ref 0–35)
AST: 15 U/L (ref 0–37)
Albumin: 4.6 g/dL (ref 3.5–5.2)
Alkaline Phosphatase: 89 U/L (ref 39–117)
BUN: 12 mg/dL (ref 6–23)
CO2: 27 mEq/L (ref 19–32)
Calcium: 9.8 mg/dL (ref 8.4–10.5)
Chloride: 102 mEq/L (ref 96–112)
Creatinine, Ser: 0.73 mg/dL (ref 0.40–1.20)
GFR: 74.28 mL/min (ref >60.00)
Glucose, Bld: 100 mg/dL — ABNORMAL HIGH (ref 70–99)
Potassium: 4.1 mEq/L (ref 3.5–5.1)
Sodium: 138 mEq/L (ref 135–145)
Total Bilirubin: 0.9 mg/dL (ref 0.2–1.2)
Total Protein: 6.4 g/dL (ref 6.0–8.3)

## 2022-05-07 LAB — PROTIME-INR
INR: 1.1 ratio — ABNORMAL HIGH (ref 0.8–1.0)
Prothrombin Time: 11.3 s (ref 9.6–13.1)

## 2022-05-07 LAB — APTT: aPTT: 57.5 s — ABNORMAL HIGH (ref 25.4–36.8)

## 2022-05-08 ENCOUNTER — Other Ambulatory Visit: Payer: Self-pay

## 2022-05-08 DIAGNOSIS — R791 Abnormal coagulation profile: Secondary | ICD-10-CM

## 2022-05-08 DIAGNOSIS — R040 Epistaxis: Secondary | ICD-10-CM

## 2022-05-09 ENCOUNTER — Ambulatory Visit: Payer: Medicare Other | Admitting: Physical Therapy

## 2022-05-13 ENCOUNTER — Ambulatory Visit: Payer: Medicare Other | Admitting: Family Medicine

## 2022-05-16 ENCOUNTER — Ambulatory Visit: Payer: Medicare Other | Admitting: Physical Therapy

## 2022-05-22 ENCOUNTER — Other Ambulatory Visit: Payer: Self-pay | Admitting: Family

## 2022-05-22 DIAGNOSIS — D509 Iron deficiency anemia, unspecified: Secondary | ICD-10-CM

## 2022-05-22 DIAGNOSIS — R040 Epistaxis: Secondary | ICD-10-CM

## 2022-05-22 DIAGNOSIS — R791 Abnormal coagulation profile: Secondary | ICD-10-CM

## 2022-05-24 ENCOUNTER — Inpatient Hospital Stay: Payer: Medicare Other | Attending: Hematology & Oncology

## 2022-05-24 ENCOUNTER — Inpatient Hospital Stay (HOSPITAL_BASED_OUTPATIENT_CLINIC_OR_DEPARTMENT_OTHER): Payer: Medicare Other | Admitting: Family

## 2022-05-24 ENCOUNTER — Inpatient Hospital Stay: Payer: Medicare Other

## 2022-05-24 VITALS — BP 189/57 | HR 61 | Temp 97.9°F | Resp 18 | Ht <= 58 in | Wt 131.0 lb

## 2022-05-24 DIAGNOSIS — I10 Essential (primary) hypertension: Secondary | ICD-10-CM | POA: Insufficient documentation

## 2022-05-24 DIAGNOSIS — R040 Epistaxis: Secondary | ICD-10-CM | POA: Insufficient documentation

## 2022-05-24 DIAGNOSIS — D509 Iron deficiency anemia, unspecified: Secondary | ICD-10-CM

## 2022-05-24 DIAGNOSIS — Z79899 Other long term (current) drug therapy: Secondary | ICD-10-CM | POA: Insufficient documentation

## 2022-05-24 DIAGNOSIS — Z801 Family history of malignant neoplasm of trachea, bronchus and lung: Secondary | ICD-10-CM | POA: Insufficient documentation

## 2022-05-24 DIAGNOSIS — R791 Abnormal coagulation profile: Secondary | ICD-10-CM | POA: Diagnosis not present

## 2022-05-24 DIAGNOSIS — Z853 Personal history of malignant neoplasm of breast: Secondary | ICD-10-CM | POA: Insufficient documentation

## 2022-05-24 DIAGNOSIS — E785 Hyperlipidemia, unspecified: Secondary | ICD-10-CM | POA: Diagnosis not present

## 2022-05-24 LAB — CMP (CANCER CENTER ONLY)
ALT: 16 U/L (ref 0–44)
AST: 14 U/L — ABNORMAL LOW (ref 15–41)
Albumin: 4.5 g/dL (ref 3.5–5.0)
Alkaline Phosphatase: 78 U/L (ref 38–126)
Anion gap: 11 (ref 5–15)
BUN: 14 mg/dL (ref 8–23)
CO2: 28 mmol/L (ref 22–32)
Calcium: 9.5 mg/dL (ref 8.9–10.3)
Chloride: 100 mmol/L (ref 98–111)
Creatinine: 0.78 mg/dL (ref 0.44–1.00)
GFR, Estimated: >60 mL/min (ref >60)
Glucose, Bld: 126 mg/dL — ABNORMAL HIGH (ref 70–99)
Potassium: 3.9 mmol/L (ref 3.5–5.1)
Sodium: 139 mmol/L (ref 135–145)
Total Bilirubin: 0.9 mg/dL (ref 0.3–1.2)
Total Protein: 6.3 g/dL — ABNORMAL LOW (ref 6.5–8.1)

## 2022-05-24 LAB — CBC WITH DIFFERENTIAL (CANCER CENTER ONLY)
Abs Immature Granulocytes: 0.07 10*3/uL (ref 0.00–0.07)
Basophils Absolute: 0 10*3/uL (ref 0.0–0.1)
Basophils Relative: 0 %
Eosinophils Absolute: 0.1 10*3/uL (ref 0.0–0.5)
Eosinophils Relative: 1 %
HCT: 35.1 % — ABNORMAL LOW (ref 36.0–46.0)
Hemoglobin: 11.7 g/dL — ABNORMAL LOW (ref 12.0–15.0)
Immature Granulocytes: 1 %
Lymphocytes Relative: 29 %
Lymphs Abs: 2.1 10*3/uL (ref 0.7–4.0)
MCH: 29.8 pg (ref 26.0–34.0)
MCHC: 33.3 g/dL (ref 30.0–36.0)
MCV: 89.3 fL (ref 80.0–100.0)
Monocytes Absolute: 0.6 10*3/uL (ref 0.1–1.0)
Monocytes Relative: 8 %
Neutro Abs: 4.3 10*3/uL (ref 1.7–7.7)
Neutrophils Relative %: 61 %
Platelet Count: 256 10*3/uL (ref 150–400)
RBC: 3.93 MIL/uL (ref 3.87–5.11)
RDW: 13.4 % (ref 11.5–15.5)
WBC Count: 7.2 10*3/uL (ref 4.0–10.5)
nRBC: 0 % (ref 0.0–0.2)

## 2022-05-24 LAB — RETICULOCYTES
Immature Retic Fract: 7.8 % (ref 2.3–15.9)
RBC.: 3.96 MIL/uL (ref 3.87–5.11)
Retic Count, Absolute: 56.2 10*3/uL (ref 19.0–186.0)
Retic Ct Pct: 1.4 % (ref 0.4–3.1)

## 2022-05-24 LAB — APTT: aPTT: 41 seconds — ABNORMAL HIGH (ref 24–36)

## 2022-05-24 LAB — FERRITIN: Ferritin: 64 ng/mL (ref 11–307)

## 2022-05-24 LAB — LACTATE DEHYDROGENASE: LDH: 142 U/L (ref 98–192)

## 2022-05-24 LAB — PROTIME-INR
INR: 1 (ref 0.8–1.2)
Prothrombin Time: 13 seconds (ref 11.4–15.2)

## 2022-05-24 NOTE — Progress Notes (Signed)
Hematology/Oncology Consultation   Name: Jane Cox      MRN: [id number]    Location: Room/bed info not found  Date: 05/24/2022 Time:2:47 PM   REFERRING PHYSICIAN: Tana Conch, MD  REASON FOR CONSULT: Elevated PTT and epistaxis    DIAGNOSIS: Elevated PTT and epistaxis   HISTORY OF PRESENT ILLNESS:  Jane Cox is a pleasant 89 yo with recent nose bleed while cooking dinner. She went to the ED and was able to stop with Afrin and Nampon. She was noted to have HTN at the time as well. No other episodes and no history of bleeding easily.   She is not on any anticoagulation or aspirin. She has not been using Flonase.  2 weeks ago her PTT with PCP was elevated at 57.5, INR 1.1.   Today PTT is 41 and INR 1.0.  No family history of bleeding disorders.  She has 1 son and no history of miscarriage.  She has had basal carcinomas removed from both her arms.  She also has remote history of breast cancer treated with mastectomy only. No chemo or radiation.  Father had lung cancer, smoker.  No history of diabetes or thyroid disease.  No fever, chills, n/v, cough, rash, dizziness, SOB, chest pain, palpitations, abdominal pain or changes in bowel or bladder habits.  No swelling, tenderness, numbness or tingling in her extremities.  No falls or syncope.  No smoking or recreational drug use. Rare ETOH on anniversary.  Appetite and hydration are good. Weight is stable at 134 lbs.  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   She stays busy with her husband and also keeps up with her housework.   ROS: All other 10 point review of systems is negative.   PAST MEDICAL HISTORY:   Past Medical History:  Diagnosis Date   Allergy    Basal cell carcinoma    HX: breast cancer    Hyperlipidemia    Hypertension    Osteopenia     ALLERGIES: Allergies  Allergen Reactions   Bacitracin-Polymyxin B     REACTION: rash    Shellfish Allergy Nausea And Vomiting   Neosporin [Neomycin-Polymyxin-Gramicidin] Rash    Also gets blisters      MEDICATIONS:  Current Outpatient Medications on File Prior to Visit  Medication Sig Dispense Refill   acetaminophen (TYLENOL) 650 MG CR tablet Take 650 mg by mouth 2 (two) times daily. Reported on 02/27/2015     amLODipine (NORVASC) 5 MG tablet TAKE 1 TABLET BY MOUTH TWICE A DAY 180 tablet 3   atorvastatin (LIPITOR) 20 MG tablet TAKE 1/2 TABLET BY MOUTH DAILY 45 tablet 1   Ayr Saline Nasal No-Drip GEL Place into the nose.     hydrochlorothiazide (MICROZIDE) 12.5 MG capsule TAKE 1 CAPSULE BY MOUTH EVERY DAY 90 capsule 1   labetalol (NORMODYNE) 100 MG tablet TAKE 1 TABLET BY MOUTH TWICE A DAY 180 tablet 3   lisinopril (ZESTRIL) 40 MG tablet TAKE 1 TABLET BY MOUTH EVERY DAY 90 tablet 1   Vitamin D, Ergocalciferol, (DRISDOL) 1.25 MG (50000 UNIT) CAPS capsule Take 1 capsule (50,000 Units total) by mouth every 7 (seven) days. 13 capsule 0   No current facility-administered medications on file prior to visit.     PAST SURGICAL HISTORY Past Surgical History:  Procedure Laterality Date   ABDOMINAL HYSTERECTOMY  1983   BREAST SURGERY     mastectomy   CHOLECYSTECTOMY  1993   COLONOSCOPY     VEIN SURGERY      FAMILY HISTORY: Family History  Problem Relation Age of Onset   Lung cancer Father     SOCIAL HISTORY:  reports that she has never smoked. She has never used smokeless tobacco. She reports current alcohol use of about 1.0 standard drink of alcohol per week. She reports that she does not use drugs.  PERFORMANCE STATUS: The patient's performance status is 1 - Symptomatic but completely ambulatory  PHYSICAL EXAM: Most Recent Vital Signs: There were no vitals taken for this visit. There were no vitals taken for this visit.  General Appearance:    Alert, cooperative, no distress, appears stated age  Head:    Normocephalic, without obvious abnormality, atraumatic  Eyes:     PERRL, conjunctiva/corneas clear, EOM's intact, fundi    benign, both eyes        Throat:   Lips, mucosa, and tongue normal; teeth and gums normal  Neck:   Supple, symmetrical, trachea midline, no adenopathy;    thyroid:  no enlargement/tenderness/nodules; no carotid   bruit or JVD  Back:     Symmetric, no curvature, ROM normal, no CVA tenderness  Lungs:     Clear to auscultation bilaterally, respirations unlabored  Chest Wall:    No tenderness or deformity   Heart:    Regular rate and rhythm, S1 and S2 normal, no murmur, rub   or gallop     Abdomen:     Soft, non-tender, bowel sounds active all four quadrants,    no masses, no organomegaly        Extremities:   Extremities normal, atraumatic, no cyanosis or edema  Pulses:   2+ and symmetric all extremities  Skin:   Skin color, texture, turgor normal, no rashes or lesions  Lymph nodes:   Cervical, supraclavicular, and axillary nodes normal  Neurologic:   CNII-XII intact, normal strength, sensation and reflexes    throughout    LABORATORY DATA:  Results for orders placed or performed in visit on 05/24/22 (from the past 48 hour(s))  CBC with Differential (Cancer Center Only)     Status: Abnormal   Collection Time: 05/24/22  2:29 PM  Result Value Ref Range   WBC Count 7.2 4.0 - 10.5 K/uL   RBC 3.93 3.87 - 5.11 MIL/uL   Hemoglobin 11.7 (L) 12.0 - 15.0 g/dL   HCT 16.1 (L) 09.6 - 04.5 %   MCV 89.3 80.0 - 100.0 fL   MCH 29.8 26.0 - 34.0 pg   MCHC 33.3 30.0 - 36.0 g/dL   RDW 40.9 81.1 - 91.4 %   Platelet Count 256 150 - 400 K/uL   nRBC 0.0 0.0 - 0.2 %   Neutrophils Relative % 61 %   Neutro Abs 4.3 1.7 - 7.7 K/uL   Lymphocytes Relative 29 %   Lymphs Abs 2.1 0.7 - 4.0 K/uL   Monocytes Relative 8 %   Monocytes Absolute 0.6 0.1 - 1.0 K/uL   Eosinophils Relative 1 %   Eosinophils Absolute 0.1 0.0 - 0.5 K/uL   Basophils Relative 0 %   Basophils Absolute 0.0 0.0 - 0.1 K/uL   Immature Granulocytes 1 %   Abs Immature Granulocytes  0.07 0.00 - 0.07 K/uL    Comment: Performed at [facility] at [facility], [address] [place], [place] [address]  Reticulocytes     Status: None   Collection Time: 05/24/22  2:29  PM  Result Value Ref Range   Retic Ct Pct 1.4 0.4 - 3.1 %   RBC. 3.96 3.87 - 5.11 MIL/uL   Retic Count, Absolute 56.2 19.0 - 186.0 K/uL   Immature Retic Fract 7.8 2.3 - 15.9 %    Comment: Performed at [facility] at [facility], [address] [place], [place] [address]      RADIOGRAPHY: No results found.     PATHOLOGY:   ASSESSMENT/PLAN: Ms. Mesenbrink is a pleasant 89 yo with recent nose bleed while cooking dinner. PTT is slowly coming down. INR within normal limits.  VWB pane, Factors 8, 9 and 11 pending.  We will await lab results prior to making follow-up.   All questions were answered. The patient knows to call the clinic with any problems, questions or concerns. We can certainly see the patient much sooner if necessary.   The patient was discussed with Dr. Myna Hidalgo and he is in agreement with the aforementioned.   Eileen Stanford, NP

## 2022-05-25 LAB — VON WILLEBRAND PANEL
Coagulation Factor VIII: 71 % (ref 56–140)
Ristocetin Co-factor, Plasma: 51 % (ref 50–200)
Von Willebrand Antigen, Plasma: 72 % (ref 50–200)

## 2022-05-25 LAB — COAG STUDIES INTERP REPORT

## 2022-05-26 LAB — FACTOR 9 ASSAY: Coagulation Factor IX: 103 % (ref 60–177)

## 2022-05-26 LAB — FACTOR 8 ASSAY: Coagulation Factor VIII: 88 % (ref 56–140)

## 2022-05-26 LAB — FACTOR 11 ASSAY: Factor XI Activity: 94 % (ref 60–150)

## 2022-05-27 LAB — IRON AND IRON BINDING CAPACITY (CC-WL,HP ONLY)
Iron: 75 ug/dL (ref 28–170)
Saturation Ratios: 20 % (ref 10.4–31.8)
TIBC: 379 ug/dL (ref 250–450)
UIBC: 304 ug/dL (ref 148–442)

## 2022-05-28 ENCOUNTER — Encounter: Payer: Self-pay | Admitting: Family Medicine

## 2022-05-28 ENCOUNTER — Encounter: Payer: Self-pay | Admitting: Family

## 2022-05-28 ENCOUNTER — Telehealth: Payer: Self-pay | Admitting: Family

## 2022-05-28 NOTE — Telephone Encounter (Signed)
I was able to speak with the patient and go over recent lab results. She was negative for any underlying bleeding disorder. We will have her follow-up with [place] one more time in 2 months to recheck her PTT. She verbalized her understanding and agreement with the plan. Patient appreciative of cal.

## 2022-06-17 ENCOUNTER — Ambulatory Visit (INDEPENDENT_AMBULATORY_CARE_PROVIDER_SITE_OTHER): Payer: Medicare Other | Admitting: Family Medicine

## 2022-06-17 ENCOUNTER — Encounter: Payer: Self-pay | Admitting: Family Medicine

## 2022-06-17 VITALS — BP 136/58 | HR 68 | Temp 97.5°F | Ht <= 58 in | Wt 136.8 lb

## 2022-06-17 DIAGNOSIS — I701 Atherosclerosis of renal artery: Secondary | ICD-10-CM

## 2022-06-17 DIAGNOSIS — E538 Deficiency of other specified B group vitamins: Secondary | ICD-10-CM

## 2022-06-17 DIAGNOSIS — I10 Essential (primary) hypertension: Secondary | ICD-10-CM

## 2022-06-17 NOTE — Patient Instructions (Addendum)
Morning blood pressure looks very reasonable and slightly high in afternoon -we opted: Retest renal artery stenosis- chance she would benefit from intervention if recurrent Check B12-some evidence may affect bp If these are normal we opted for 3-7 day trial of 2 hydrochlorothiazide 12.5 mg at the same time  Lets touch base after we get these data points back  We have placed a referral for you today for renal artery stenosis ultrasound. In some cases you will see # listed below- you can call this if you have not heard within a week. If you do not see # listed- you should receive a mychart message or phone call within a week with the # to call- please reach out if not.    Please stop by lab before you go If you have mychart- we will send your results within 3 business days of [place] receiving them.  If you do not have mychart- we will call you about results within 5 business days of [place] receiving them.  *please also note that you will see labs on mychart as soon as they post. I will later go in and write notes on them- will say "notes from Dr. Durene Cal"   Recommended follow up: Return for next already scheduled visit or sooner if needed.

## 2022-06-17 NOTE — Progress Notes (Signed)
Phone [phone] In person visit   Subjective:   Jane Cox is a 89 y.o. year old very pleasant female patient who presents for/with See problem oriented charting Chief Complaint  Patient presents with   Medical Management of Chronic Issues   Hypertension    Pt states taking an extra amlodpine did not help it caused swelling and bruising.   lab work    Would like b12 level checked, she heard this can effect bp   Past Medical History-  Patient Active Problem List   Diagnosis Date Noted   Vitamin D deficiency 03/05/2022    Priority: Medium    Renal artery stenosis (HCC) 12/15/2013    Priority: Medium    Hyperlipidemia 09/10/2006    Priority: Medium    Essential hypertension 08/18/2006    Priority: Medium    BREAST CANCER, HX OF 08/18/2006    Priority: Medium    Ruptured Bakers cyst 01/13/2018    Priority: Low   Skin cancer, basal cell 04/20/2015    Priority: Low   Lumbar degenerative disc disease 05/23/2014    Priority: Low   Arthritis of left hip 05/23/2014    Priority: Low   Greater trochanteric bursitis of right hip 05/23/2014    Priority: Low   Allergic rhinitis 08/18/2006    Priority: Low   Osteopenia 08/18/2006    Priority: Low    Medications- reviewed and updated Current Outpatient Medications  Medication Sig Dispense Refill   acetaminophen (TYLENOL) 650 MG CR tablet Take 650 mg by mouth 2 (two) times daily. Reported on 02/27/2015     amLODipine (NORVASC) 5 MG tablet TAKE 1 TABLET BY MOUTH TWICE A DAY (Patient taking differently: Take 5 mg by mouth daily.) 180 tablet 3   atorvastatin (LIPITOR) 20 MG tablet TAKE 1/2 TABLET BY MOUTH DAILY 45 tablet 1   Ayr Saline Nasal No-Drip GEL Place into the nose.     hydrochlorothiazide (MICROZIDE) 12.5 MG capsule TAKE 1 CAPSULE BY MOUTH EVERY DAY 90 capsule 1   labetalol (NORMODYNE) 100 MG tablet TAKE 1 TABLET BY MOUTH TWICE A DAY 180 tablet 3   lisinopril (ZESTRIL) 40 MG tablet TAKE 1 TABLET BY MOUTH EVERY DAY 90  tablet 1   Vitamin D, Ergocalciferol, (DRISDOL) 1.25 MG (50000 UNIT) CAPS capsule Take 1 capsule (50,000 Units total) by mouth every 7 (seven) days. 13 capsule 0   No current facility-administered medications for this visit.     Objective:  BP (!) 136/58   Pulse 68   Temp (!) 97.5 F (36.4 C)   Ht 4\' 9"  (1.448 m)   Wt 136 lb 12.8 oz (62.1 kg)   SpO2 96%   BMI 29.60 kg/m  Gen: NAD, resting comfortably CV: RRR no murmurs rubs or gallops Lungs: CTAB no crackles, wheeze, rhonchi Ext: 1+ edema on right, trace to 1+ on left Skin: warm, dry     Assessment and Plan    #hypertension #Renal artery stenosis-unilateral and should not cause blood pressure issues-was released by cardiology in 2018-she declined further imaging previously S: medication: Amlodipine 5 mg once daily, hydrochlorothiazide 12.5 mg daily , labetalol twice daily 100 mg , lisinopril 40 mg -trial of amlodipine 5 mg twice daily caused a lot of swelling and bruising as well  Home readings #s: 130s in am and 150's in evening -she worries about increased urination BP Readings from Last 3 Encounters:  06/17/22 (!) 136/58  05/24/22 (!) 189/57  05/06/22 (!) 164/70  A/P: Morning blood pressure  looks very reasonable and slightly high in afternoon -we opted: Known renal artery stenosis with history of angioplasty treating this in the past and was unilateral so should not cause hypertension (or any issue with lisinopril)-retest renal artery stenosis- chance she would benefit from intervention if recurrent-consider vascular referral Check B12-some evidence may affect bp and raise homocystine levels.  She also has baseline known anemia which would be another reason to check but thankfully stable recently If these are normal we opted for 3-7 day trial of 2 hydrochlorothiazide 12.5 mg at the same time  # B12 deficiency relative- prefer over 400 S: Current treatment/medication (oral vs. IM): daily vitamin that likely has b12  Lab  Results  Component Value Date   VITAMINB12 281 04/18/2009  A/P: relative B12 deficiency in the past (I much prefer over 400 as symptoms can develop below this)- update B12 with labs today   #epistaxis- with high PTT but improving- they are going to have her back for recheck- had 9 vials that otherwise were reassuring  Recommended follow up: Return for next already scheduled visit or sooner if needed. Future Appointments  Date Time Provider Department Center  08/06/2022  2:30 PM [facility] None  08/06/2022  3:00 PM Erenest Blank, NP [facility] None  09/03/2022  2:20 PM Durene Cal Aldine Contes, MD [facility]    Lab/Order associations:   ICD-10-CM   1. Essential hypertension  I10     2. Renal artery stenosis (HCC)  I70.1 VAS US RENAL ARTERY DUPLEX    3. B12 deficiency  E53.8 Vitamin B12     Return precautions advised.  Tana Conch, MD

## 2022-06-18 ENCOUNTER — Encounter: Payer: Self-pay | Admitting: Family Medicine

## 2022-06-18 LAB — VITAMIN B12: Vitamin B-12: 182 pg/mL — ABNORMAL LOW (ref 211–911)

## 2022-06-19 NOTE — Telephone Encounter (Signed)
I called and let pt know the option is to do in office or at home with the injections. I let pt know this will only be a nurse visit. Pt stated she will give [place] a call back to schedule nurse visit for B12 injection.

## 2022-06-27 ENCOUNTER — Encounter (HOSPITAL_COMMUNITY): Payer: Medicare Other

## 2022-07-02 NOTE — Telephone Encounter (Signed)
Attempted to contact pt to get scheduled for b12 but was not able to reach , sent mychart message

## 2022-07-09 ENCOUNTER — Ambulatory Visit (HOSPITAL_COMMUNITY)
Admission: RE | Admit: 2022-07-09 | Discharge: 2022-07-09 | Disposition: A | Discharge status: Home / Self Care | Payer: Medicare Other | Source: Ambulatory Visit | Attending: Family Medicine | Admitting: Family Medicine

## 2022-07-09 ENCOUNTER — Encounter: Payer: Self-pay | Admitting: Family Medicine

## 2022-07-09 DIAGNOSIS — I701 Atherosclerosis of renal artery: Secondary | ICD-10-CM | POA: Insufficient documentation

## 2022-07-11 ENCOUNTER — Ambulatory Visit (INDEPENDENT_AMBULATORY_CARE_PROVIDER_SITE_OTHER): Payer: Medicare Other | Admitting: *Deleted

## 2022-07-11 DIAGNOSIS — E538 Deficiency of other specified B group vitamins: Secondary | ICD-10-CM

## 2022-07-11 MED ORDER — CYANOCOBALAMIN 1000 MCG/ML IJ SOLN
1000.0000 ug | Freq: Once | INTRAMUSCULAR | Status: AC
Start: 2022-07-11 — End: 2022-07-11
  Administered 2022-07-11: 1000 ug via INTRAMUSCULAR

## 2022-07-11 NOTE — Progress Notes (Signed)
Per orders of Dr. Durene Cal, injection of B 12 given in left deltoid per patient preference by Jobe Gibbon, CMA. Patient tolerated injection well. Patient advised to schedule next injection.

## 2022-07-11 NOTE — Progress Notes (Signed)
I have reviewed and agree with note, evaluation, plan.   Kaliya Shreiner, MD  

## 2022-07-14 ENCOUNTER — Other Ambulatory Visit: Payer: Self-pay | Admitting: Family Medicine

## 2022-07-18 ENCOUNTER — Ambulatory Visit (INDEPENDENT_AMBULATORY_CARE_PROVIDER_SITE_OTHER): Payer: Medicare Other | Admitting: *Deleted

## 2022-07-18 DIAGNOSIS — E538 Deficiency of other specified B group vitamins: Secondary | ICD-10-CM | POA: Diagnosis not present

## 2022-07-18 MED ORDER — CYANOCOBALAMIN 1000 MCG/ML IJ SOLN
1000.0000 ug | Freq: Once | INTRAMUSCULAR | Status: AC
Start: 2022-07-18 — End: 2022-07-18
  Administered 2022-07-18: 1000 ug via INTRAMUSCULAR

## 2022-07-18 NOTE — Progress Notes (Signed)
Per orders of Dr. Hunter, injection of B 12 given in left deltoid per patient preference by Dallin Mccorkel S Merrie Epler, CMA. Patient tolerated injection well. Patient reminded to schedule next injection. 

## 2022-07-25 ENCOUNTER — Ambulatory Visit: Payer: Medicare Other

## 2022-07-30 ENCOUNTER — Ambulatory Visit (INDEPENDENT_AMBULATORY_CARE_PROVIDER_SITE_OTHER): Payer: Medicare Other

## 2022-07-30 DIAGNOSIS — E538 Deficiency of other specified B group vitamins: Secondary | ICD-10-CM | POA: Diagnosis not present

## 2022-07-30 MED ORDER — CYANOCOBALAMIN 1000 MCG/ML IJ SOLN
1000.0000 ug | Freq: Once | INTRAMUSCULAR | Status: AC
Start: 2022-07-30 — End: 2022-07-30
  Administered 2022-07-30: 1000 ug via INTRAMUSCULAR

## 2022-07-30 NOTE — Progress Notes (Signed)
Pt tolerated b12 injection well. 

## 2022-08-06 ENCOUNTER — Ambulatory Visit: Payer: Medicare Other | Admitting: Family

## 2022-08-06 ENCOUNTER — Other Ambulatory Visit: Payer: Medicare Other

## 2022-08-07 ENCOUNTER — Ambulatory Visit (INDEPENDENT_AMBULATORY_CARE_PROVIDER_SITE_OTHER): Payer: Medicare Other

## 2022-08-07 DIAGNOSIS — E538 Deficiency of other specified B group vitamins: Secondary | ICD-10-CM

## 2022-08-07 MED ORDER — CYANOCOBALAMIN 1000 MCG/ML IJ SOLN
1000.0000 ug | Freq: Once | INTRAMUSCULAR | Status: AC
Start: 2022-08-07 — End: 2022-08-07
  Administered 2022-08-07: 1000 ug via INTRAMUSCULAR

## 2022-08-07 MED ORDER — CYANOCOBALAMIN 1000 MCG/ML IJ SOLN
1000.0000 ug | Freq: Once | INTRAMUSCULAR | Status: DC
Start: 2022-08-07 — End: 2023-02-25

## 2022-08-07 NOTE — Progress Notes (Signed)
Patient was given the b12 injection in the    arm today. Patient tolerated injection well.  Donzetta Starch, CMA

## 2022-08-12 ENCOUNTER — Other Ambulatory Visit: Payer: Self-pay | Admitting: Family

## 2022-08-12 DIAGNOSIS — R040 Epistaxis: Secondary | ICD-10-CM

## 2022-08-12 DIAGNOSIS — R791 Abnormal coagulation profile: Secondary | ICD-10-CM

## 2022-08-13 ENCOUNTER — Inpatient Hospital Stay: Payer: Medicare Other | Attending: Hematology & Oncology

## 2022-08-13 ENCOUNTER — Inpatient Hospital Stay (HOSPITAL_BASED_OUTPATIENT_CLINIC_OR_DEPARTMENT_OTHER): Payer: Medicare Other | Admitting: Family

## 2022-08-13 ENCOUNTER — Encounter: Payer: Self-pay | Admitting: Family

## 2022-08-13 VITALS — BP 180/62 | HR 65 | Temp 98.1°F | Resp 18 | Wt 137.1 lb

## 2022-08-13 DIAGNOSIS — R791 Abnormal coagulation profile: Secondary | ICD-10-CM | POA: Diagnosis not present

## 2022-08-13 DIAGNOSIS — R040 Epistaxis: Secondary | ICD-10-CM

## 2022-08-13 LAB — CMP (CANCER CENTER ONLY)
ALT: 17 U/L (ref 0–44)
AST: 16 U/L (ref 15–41)
Albumin: 4.9 g/dL (ref 3.5–5.0)
Alkaline Phosphatase: 79 U/L (ref 38–126)
Anion gap: 7 (ref 5–15)
BUN: 15 mg/dL (ref 8–23)
CO2: 30 mmol/L (ref 22–32)
Calcium: 10.2 mg/dL (ref 8.9–10.3)
Chloride: 102 mmol/L (ref 98–111)
Creatinine: 0.81 mg/dL (ref 0.44–1.00)
GFR, Estimated: >60 mL/min (ref >60)
Glucose, Bld: 119 mg/dL — ABNORMAL HIGH (ref 70–99)
Potassium: 4.1 mmol/L (ref 3.5–5.1)
Sodium: 139 mmol/L (ref 135–145)
Total Bilirubin: 1.2 mg/dL (ref 0.3–1.2)
Total Protein: 6.8 g/dL (ref 6.5–8.1)

## 2022-08-13 LAB — CBC WITH DIFFERENTIAL (CANCER CENTER ONLY)
Abs Immature Granulocytes: 0.01 10*3/uL (ref 0.00–0.07)
Basophils Absolute: 0 10*3/uL (ref 0.0–0.1)
Basophils Relative: 0 %
Eosinophils Absolute: 0.1 10*3/uL (ref 0.0–0.5)
Eosinophils Relative: 1 %
HCT: 35.7 % — ABNORMAL LOW (ref 36.0–46.0)
Hemoglobin: 11.7 g/dL — ABNORMAL LOW (ref 12.0–15.0)
Immature Granulocytes: 0 %
Lymphocytes Relative: 31 %
Lymphs Abs: 2 10*3/uL (ref 0.7–4.0)
MCH: 29.2 pg (ref 26.0–34.0)
MCHC: 32.8 g/dL (ref 30.0–36.0)
MCV: 89 fL (ref 80.0–100.0)
Monocytes Absolute: 0.6 10*3/uL (ref 0.1–1.0)
Monocytes Relative: 9 %
Neutro Abs: 3.7 10*3/uL (ref 1.7–7.7)
Neutrophils Relative %: 59 %
Platelet Count: 243 10*3/uL (ref 150–400)
RBC: 4.01 MIL/uL (ref 3.87–5.11)
RDW: 13.2 % (ref 11.5–15.5)
WBC Count: 6.4 10*3/uL (ref 4.0–10.5)
nRBC: 0 % (ref 0.0–0.2)

## 2022-08-13 LAB — APTT: aPTT: 43 seconds — ABNORMAL HIGH (ref 24–36)

## 2022-08-13 LAB — PROTIME-INR
INR: 1 (ref 0.8–1.2)
Prothrombin Time: 13.3 seconds (ref 11.4–15.2)

## 2022-08-13 NOTE — Progress Notes (Signed)
Hematology and Oncology Follow Up Visit  Jane Cox [id number] 1933/02/23 89 y.o. 08/13/2022   Principle Diagnosis:  Elevated PTT and epistaxis  Current Therapy:   Observation   Interim History:  Jane Cox is here today with her son for follow-up. Thankfully her VWB panel and factors 8, 9 and 11 were within normal limits.  PTT today is stable at 43, PT 13.3. and INR 1.0.  She has not had any new episodes of blood loss. No abnormal bruising, no petechiae.  Her PCP noted significant narrowing of the celiac artery and superior mesenteric artery on recent vascular US. She states she has an appointment coming up with him to discuss referral to Vascular surgery.  No fever, chills, n/v, cough, rash, dizziness, SOB, chest pain, palpitations, abdominal pain or changes in bowel or bladder habits at this time.  No swelling, tenderness, numbness or tingling in her extremities.  No falls or syncope.  Appetite and hydration are good. Weight is stable at 137 lbs.   ECOG Performance Status: 0 - Asymptomatic  Medications:  Allergies as of 08/13/2022       Reactions   Bacitracin-polymyxin B    REACTION: rash   Shellfish Allergy Nausea And Vomiting   Neosporin [neomycin-polymyxin-gramicidin] Rash   Also gets blisters        Medication List        Accurate as of August 13, 2022  1:44 PM. If you have any questions, ask your nurse or doctor.          acetaminophen 650 MG CR tablet Commonly known as: TYLENOL Take 650 mg by mouth 2 (two) times daily. Reported on 02/27/2015   amLODipine 5 MG tablet Commonly known as: NORVASC TAKE 1 TABLET BY MOUTH TWICE A DAY What changed: when to take this   atorvastatin 20 MG tablet Commonly known as: LIPITOR TAKE HALF TABLET BY MOUTH DAILY   Ayr Saline Nasal No-Drip Gel Place into the nose.   hydrochlorothiazide 12.5 MG capsule Commonly known as: MICROZIDE TAKE 1 CAPSULE BY MOUTH EVERY DAY   labetalol 100 MG tablet Commonly known as:  NORMODYNE TAKE 1 TABLET BY MOUTH TWICE A DAY   lisinopril 40 MG tablet Commonly known as: ZESTRIL TAKE 1 TABLET BY MOUTH EVERY DAY   Vitamin D (Ergocalciferol) 1.25 MG (50000 UNIT) Caps capsule Commonly known as: DRISDOL Take 1 capsule (50,000 Units total) by mouth every 7 (seven) days.        Allergies:  Allergies  Allergen Reactions   Bacitracin-Polymyxin B     REACTION: rash   Shellfish Allergy Nausea And Vomiting   Neosporin [Neomycin-Polymyxin-Gramicidin] Rash    Also gets blisters    Past Medical History, Surgical history, Social history, and Family History were reviewed and updated.  Review of Systems: All other 10 point review of systems is negative.   Physical Exam:  vitals were not taken for this visit.   Wt Readings from Last 3 Encounters:  06/17/22 136 lb 12.8 oz (62.1 kg)  05/24/22 131 lb (59.4 kg)  05/06/22 131 lb 3.2 oz (59.5 kg)    Ocular: Sclerae unicteric, pupils equal, round and reactive to light Ear-nose-throat: Oropharynx clear, dentition fair Lymphatic: No cervical or supraclavicular adenopathy Lungs no rales or rhonchi, good excursion bilaterally Heart regular rate and rhythm, no murmur appreciated Abd soft, nontender, positive bowel sounds MSK no focal spinal tenderness, no joint edema Neuro: non-focal, well-oriented, appropriate affect Breasts: Deferred   Lab Results  Component Value Date  WBC 7.2 05/24/2022   HGB 11.7 (L) 05/24/2022   HCT 35.1 (L) 05/24/2022   MCV 89.3 05/24/2022   PLT 256 05/24/2022   Lab Results  Component Value Date   FERRITIN 64 05/24/2022   IRON 75 05/24/2022   TIBC 379 05/24/2022   UIBC 304 05/24/2022   IRONPCTSAT 20 05/24/2022   Lab Results  Component Value Date   RETICCTPCT 1.4 05/24/2022   RBC 3.93 05/24/2022   RBC 3.96 05/24/2022   No results found for: "KPAFRELGTCHN", "LAMBDASER", "KAPLAMBRATIO" No results found for: "IGGSERUM", "IGA", "IGMSERUM" No results found for: "TOTALPROTELP",  "ALBUMINELP", "A1GS", "A2GS", "BETS", "BETA2SER", "GAMS", "MSPIKE", "SPEI"   Chemistry      Component Value Date/Time   NA 139 05/24/2022 1429   K 3.9 05/24/2022 1429   CL 100 05/24/2022 1429   CO2 28 05/24/2022 1429   BUN 14 05/24/2022 1429   CREATININE 0.78 05/24/2022 1429   CREATININE 0.73 01/13/2020 1554      Component Value Date/Time   CALCIUM 9.5 05/24/2022 1429   ALKPHOS 78 05/24/2022 1429   AST 14 (L) 05/24/2022 1429   ALT 16 05/24/2022 1429   BILITOT 0.9 05/24/2022 1429       Impression and Plan: Jane Cox is a pleasant 89 yo female with recent nose bleed while cooking dinner.  She has not had any new issues.  PTT is stable at 43, PT/INR unremarkable.  We will plan to see her again in 4 months. If all is well at that time we will release her back to her PCP for follow-up.   Eileen Stanford, NP 7/16/20241:44 PM

## 2022-08-20 DIAGNOSIS — H35373 Puckering of macula, bilateral: Secondary | ICD-10-CM | POA: Diagnosis not present

## 2022-08-27 ENCOUNTER — Telehealth: Payer: Medicare Other | Admitting: Family

## 2022-09-03 ENCOUNTER — Ambulatory Visit: Payer: Medicare Other | Admitting: Family Medicine

## 2022-09-18 ENCOUNTER — Ambulatory Visit (INDEPENDENT_AMBULATORY_CARE_PROVIDER_SITE_OTHER): Payer: Medicare Other | Admitting: Family Medicine

## 2022-09-18 ENCOUNTER — Encounter: Payer: Self-pay | Admitting: Family Medicine

## 2022-09-18 VITALS — BP 138/69 | HR 58 | Temp 97.7°F | Ht <= 58 in | Wt 134.6 lb

## 2022-09-18 DIAGNOSIS — I701 Atherosclerosis of renal artery: Secondary | ICD-10-CM

## 2022-09-18 DIAGNOSIS — I771 Stricture of artery: Secondary | ICD-10-CM

## 2022-09-18 DIAGNOSIS — I10 Essential (primary) hypertension: Secondary | ICD-10-CM | POA: Diagnosis not present

## 2022-09-18 DIAGNOSIS — E538 Deficiency of other specified B group vitamins: Secondary | ICD-10-CM | POA: Diagnosis not present

## 2022-09-18 DIAGNOSIS — E785 Hyperlipidemia, unspecified: Secondary | ICD-10-CM

## 2022-09-18 DIAGNOSIS — K551 Chronic vascular disorders of intestine: Secondary | ICD-10-CM

## 2022-09-18 NOTE — Progress Notes (Signed)
Phone [phone] In person visit   Subjective:   Jane Cox is a 89 y.o. year old very pleasant female patient who presents for/with See problem oriented charting Chief Complaint  Patient presents with   Medical Management of Chronic Issues   Hyperlipidemia   Hypertension    Blood pressure keeps increasing states b12 shots did not help her bp    Past Medical History-  Patient Active Problem List   Diagnosis Date Noted   Vitamin D deficiency 03/05/2022    Priority: Medium    Renal artery stenosis (HCC) 12/15/2013    Priority: Medium    Hyperlipidemia 09/10/2006    Priority: Medium    Essential hypertension 08/18/2006    Priority: Medium    BREAST CANCER, HX OF 08/18/2006    Priority: Medium    Ruptured Bakers cyst 01/13/2018    Priority: Low   Skin cancer, basal cell 04/20/2015    Priority: Low   Lumbar degenerative disc disease 05/23/2014    Priority: Low   Arthritis of left hip 05/23/2014    Priority: Low   Greater trochanteric bursitis of right hip 05/23/2014    Priority: Low   Allergic rhinitis 08/18/2006    Priority: Low   Osteopenia 08/18/2006    Priority: Low    Medications- reviewed and updated Current Outpatient Medications  Medication Sig Dispense Refill   acetaminophen (TYLENOL) 650 MG CR tablet Take 650 mg by mouth every 8 (eight) hours as needed. Reported on 02/27/2015     amLODipine (NORVASC) 5 MG tablet TAKE 1 TABLET BY MOUTH TWICE A DAY (Patient taking differently: Take 5 mg by mouth daily.) 180 tablet 3   atorvastatin (LIPITOR) 20 MG tablet TAKE HALF TABLET BY MOUTH DAILY 45 tablet 1   Ayr Saline Nasal No-Drip GEL Place into the nose.     hydrochlorothiazide (MICROZIDE) 12.5 MG capsule TAKE 1 CAPSULE BY MOUTH EVERY DAY 90 capsule 1   labetalol (NORMODYNE) 100 MG tablet TAKE 1 TABLET BY MOUTH TWICE A DAY 180 tablet 3   lisinopril (ZESTRIL) 40 MG tablet TAKE 1 TABLET BY MOUTH EVERY DAY 90 tablet 1   Vitamin D, Ergocalciferol, (DRISDOL) 1.25  MG (50000 UNIT) CAPS capsule Take 1 capsule (50,000 Units total) by mouth every 7 (seven) days. 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 #hyperlipidemia S: Medication:Atorvastatin 20 mg-takes half tablet daily Lab Results  Component Value Date   CHOL 148 03/05/2022   HDL 55.80 03/05/2022   LDLCALC 59 01/13/2020   LDLDIRECT 59.0 03/05/2022   TRIG 264.0 (H) 03/05/2022   CHOLHDL 3 03/05/2022  A/P: 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 noted but without symptoms- refer to vascular for their opinion Lipids at [facility]ol- continue current medications with LDL under 70  #  hypertension #Renal artery stenosis-unilateral and should not cause blood pressure issues-was released by cardiology in 2018-she declines further imaging S: medication: Amlodipine 5 mg am , hydrochlorothiazide 12.5 mg, labetalol  100 mg twice daily, lisinopril 40 mg Home readings #s:  in recent days vary from 152 peak to 133 low with diastolics in 50's or 60s A/P: blood pressure reasonably well controlled on last home readings but with celiac artery stenosis and some readings up to 150 we opted to try two of the hydrochlorothiazide 12.5 mg tablets or 25 mg total over next 2-3 weeks and she will update me (if she can tolerate and if blood pressure well controlled ) -I do not think renal artery stenosis  is the cause of high blood pressure - but we will get vascular opinion   # B12 deficiency-182 on 06/17/22 S: Current treatment/medication (oral vs. IM):  injections weekly for a month when discovered now back on oral B12  at least some- encouraged consistency - no benefit to any symptoms on injections A/P: hopefully improved- check B12 next visit   #epistaxis- no nose bleeds since good Friday. Working with hematology on PTT- they said one more visit and if stable likely release back to primary care  Recommended follow up: Return in about 6 months (around 03/21/2023) for followup or sooner if needed.Schedule b4 you leave. Future Appointments  Date Time Provider Department Center  12/12/2022  2:15 PM [facility] None  12/12/2022  2:30 PM Covington, Sarah M, PA-C [facility] None   Lab/Order associations:   ICD-10-CM   1. Celiac artery stenosis (HCC)  I77.1 Ambulatory referral to Vascular Surgery    2. Renal artery stenosis (HCC)  I70.1 Ambulatory referral to Vascular Surgery    3. Superior mesenteric artery stenosis (HCC)  K55.1 Ambulatory referral to Vascular Surgery    4. Hyperlipidemia, unspecified hyperlipidemia type  E78.5     5. B12 deficiency  E53.8     6. Essential hypertension  I10      No orders of the defined types were placed in this encounter.   Return precautions advised.  Tana Conch, MD

## 2022-09-18 NOTE — Patient Instructions (Addendum)
Let us know if you get your flu or COVID vaccine at the pharmacy or your [facility].  We have placed a referral for you today to vascular surgery. In some cases you will see # listed below- you can call this if you have not heard within a week. If you do not see # listed- you should receive a mychart message or phone call within a week with the # to call directly- call that as soon as you get it. If you are having issues getting scheduled reach out to [place] again.     try two of the hydrochlorothiazide 12.5 mg tablets or 25 mg total over next 2-3 weeks and she will update me (if she can tolerate and if blood pressure well controlled ) -we can change to 25 mg dose if this works and you tolerate it  Recommended follow up: Return in about 6 months (around 03/21/2023) for followup or sooner if needed.Schedule b4 you leave.

## 2022-10-18 ENCOUNTER — Other Ambulatory Visit: Payer: Self-pay | Admitting: Family Medicine

## 2022-10-21 DIAGNOSIS — Z23 Encounter for immunization: Secondary | ICD-10-CM | POA: Diagnosis not present

## 2022-10-22 ENCOUNTER — Encounter: Payer: Medicare Other | Admitting: Vascular Surgery

## 2022-12-12 ENCOUNTER — Ambulatory Visit: Payer: Medicare Other | Admitting: Medical Oncology

## 2022-12-12 ENCOUNTER — Inpatient Hospital Stay: Payer: Medicare Other

## 2023-01-08 DIAGNOSIS — L570 Actinic keratosis: Secondary | ICD-10-CM | POA: Diagnosis not present

## 2023-01-08 DIAGNOSIS — L820 Inflamed seborrheic keratosis: Secondary | ICD-10-CM | POA: Diagnosis not present

## 2023-01-08 DIAGNOSIS — Z85828 Personal history of other malignant neoplasm of skin: Secondary | ICD-10-CM | POA: Diagnosis not present

## 2023-01-08 DIAGNOSIS — L821 Other seborrheic keratosis: Secondary | ICD-10-CM | POA: Diagnosis not present

## 2023-01-10 ENCOUNTER — Encounter: Payer: Self-pay | Admitting: Physician Assistant

## 2023-01-10 ENCOUNTER — Ambulatory Visit (INDEPENDENT_AMBULATORY_CARE_PROVIDER_SITE_OTHER): Payer: Medicare Other | Admitting: Physician Assistant

## 2023-01-10 VITALS — BP 140/70 | HR 62 | Temp 97.1°F | Ht 61.0 in | Wt 139.8 lb

## 2023-01-10 DIAGNOSIS — H9201 Otalgia, right ear: Secondary | ICD-10-CM

## 2023-01-10 NOTE — Progress Notes (Signed)
Jane Cox is a 89 y.o. female here for a new problem.  History of Present Illness:   Chief Complaint  Patient presents with   Ear Pain    Right ear pain    HPI  Right ear pain She complains of intermittent ear pain ongoing for several weeks. Denies sharp pain and describes the pain as more of an ache.  Sx worsens at nighttime, especially when lying on her right side.   She endorses nasal congestion as a side effect of taking labetalol BID.  She also notes her husband passed on 13-Dec-2022, and as a result endorses increased blowing of her nose and sneezing.  Nasal congestion has not increased/worsened since her ear pain started.  Her nasal passage has been dry and "crusted", which led to a nose bleed she presented to the ED with.  While in the ED, she was provided with AYR gel to managing the dryness.  No fever, chills, sore throat.   Past Medical History:  Diagnosis Date   Allergy    Basal cell carcinoma    HX: breast cancer    Hyperlipidemia    Hypertension    Osteopenia      Social History   Tobacco Use   Smoking status: Never   Smokeless tobacco: Never  Substance Use Topics   Alcohol use: Yes    Alcohol/week: 1.0 standard drink of alcohol    Types: 1 drink(s) per week   Drug use: No    Past Surgical History:  Procedure Laterality Date   ABDOMINAL HYSTERECTOMY  1983   BREAST SURGERY     mastectomy   CHOLECYSTECTOMY  1993   COLONOSCOPY     VEIN SURGERY      Family History  Problem Relation Age of Onset   Lung cancer Father     Allergies  Allergen Reactions   Bacitracin-Polymyxin B     REACTION: rash   Shellfish Allergy Nausea And Vomiting   Neosporin [Neomycin-Polymyxin-Gramicidin] Rash    Also gets blisters    Current Medications:   Current Outpatient Medications:    acetaminophen (TYLENOL) 650 MG CR tablet, Take 650 mg by mouth every 8 (eight) hours as needed. Reported on 02/27/2015, Disp: , Rfl:    amLODipine (NORVASC) 5 MG tablet, TAKE 1  TABLET BY MOUTH TWICE A DAY (Patient taking differently: Take 5 mg by mouth daily.), Disp: 180 tablet, Rfl: 3   atorvastatin (LIPITOR) 20 MG tablet, TAKE HALF TABLET BY MOUTH DAILY, Disp: 45 tablet, Rfl: 1   Ayr Saline Nasal No-Drip GEL, Place into the nose., Disp: , Rfl:    hydrochlorothiazide (MICROZIDE) 12.5 MG capsule, TAKE 1 CAPSULE BY MOUTH EVERY DAY, Disp: 90 capsule, Rfl: 1   labetalol (NORMODYNE) 100 MG tablet, TAKE 1 TABLET BY MOUTH TWICE A DAY, Disp: 180 tablet, Rfl: 3   lisinopril (ZESTRIL) 40 MG tablet, TAKE 1 TABLET BY MOUTH EVERY DAY, Disp: 90 tablet, Rfl: 1  Current Facility-Administered Medications:    cyanocobalamin (VITAMIN B12) injection 1,000 mcg, 1,000 mcg, Intramuscular, Once, Jeani Sow, MD   Review of Systems:   ROS Negative unless otherwise specified per HPI.  Vitals:   Vitals:   01/10/23 1340  BP: (!) 140/70  Pulse: 62  Temp: (!) 97.1 F (36.2 C)  TempSrc: Temporal  SpO2: 94%  Weight: 139 lb 12.8 oz (63.4 kg)  Height: 5\' 1"  (1.549 m)     Body mass index is 26.41 kg/m.  Physical Exam:   Physical Exam Vitals  and nursing note reviewed.  Constitutional:      General: She is not in acute distress.    Appearance: She is well-developed. She is not ill-appearing or toxic-appearing.  HENT:     Head: Normocephalic and atraumatic.     Right Ear: Ear canal and external ear normal. A middle ear effusion is present. Tympanic membrane is not erythematous, retracted or bulging.     Left Ear: Tympanic membrane, ear canal and external ear normal. Tympanic membrane is not erythematous, retracted or bulging.     Nose: Nose normal.     Right Sinus: No maxillary sinus tenderness or frontal sinus tenderness.     Left Sinus: No maxillary sinus tenderness or frontal sinus tenderness.     Mouth/Throat:     Pharynx: Uvula midline. No posterior oropharyngeal erythema.  Eyes:     General: Lids are normal.     Conjunctiva/sclera: Conjunctivae normal.  Neck:      Trachea: Trachea normal.  Cardiovascular:     Rate and Rhythm: Normal rate and regular rhythm.     Heart sounds: Normal heart sounds, S1 normal and S2 normal.  Pulmonary:     Effort: Pulmonary effort is normal.     Breath sounds: Normal breath sounds. No decreased breath sounds, wheezing, rhonchi or rales.  Lymphadenopathy:     Cervical: No cervical adenopathy.  Skin:    General: Skin is warm and dry.  Neurological:     Mental Status: She is alert.  Psychiatric:        Speech: Speech normal.        Behavior: Behavior normal. Behavior is cooperative.     Assessment and Plan:   Right ear pain No red flags Suspect eustachian tube dysfunction Discussed as follows: Start Nasal saline spray (i.e., Simply Saline) or nasal saline lavage (i.e., NeilMed) is recommended as needed and prior to medicated nasal sprays. Start Flonase 1 spray twice a day. In the event of a nosebleed, spray large cotton swab tip with OTC (available over the counter without a prescription) Afrin/Oxymetazoline and place in bleeding nostril  Discussed taking medications as prescribed.  Reviewed return precautions including new or worsening fever, SOB, new or worsening cough or other concerns.  Push fluids and rest.  I recommend that patient follow-up if symptoms worsen or persist despite treatment x 7-10 days, sooner if needed.  I, Isabelle Course, acting as a Neurosurgeon for Jarold Motto, [place]., have documented all relevant documentation on the behalf of Jarold Motto, [place], as directed by  Jarold Motto, PA while in the presence of Jarold Motto, [place].  I, Jarold Motto, [place], have reviewed all documentation for this visit. The documentation on 01/10/23 for the exam, diagnosis, procedures, and orders are all accurate and complete.   Jarold Motto, PA-C

## 2023-01-10 NOTE — Patient Instructions (Addendum)
It was great to see you!  Take care,  Krystiana Fornes PA-C  

## 2023-01-13 ENCOUNTER — Other Ambulatory Visit: Payer: Self-pay

## 2023-01-13 MED ORDER — AMLODIPINE BESYLATE 5 MG PO TABS: 5.0000 mg | ORAL_TABLET | Freq: Every day | ORAL | 3 refills | Status: DC

## 2023-01-13 NOTE — Telephone Encounter (Signed)
Per message from Dr. Durene Cal: You can send in as 5 mg daily-I looked back and that is what I noted in the last note and I may just not have updated the prescription-appreciate you catching this! ok to change. Changed Rx to Amlodipine 5 mg daily and sent to patient's pharmacy. Patient called and informed all (if any) questions were answered.

## 2023-02-04 ENCOUNTER — Ambulatory Visit (INDEPENDENT_AMBULATORY_CARE_PROVIDER_SITE_OTHER): Payer: Medicare Other | Admitting: Family Medicine

## 2023-02-04 ENCOUNTER — Encounter: Payer: Self-pay | Admitting: Family Medicine

## 2023-02-04 VITALS — BP 184/67 | HR 59 | Temp 97.3°F | Ht 61.0 in | Wt 140.2 lb

## 2023-02-04 DIAGNOSIS — H9203 Otalgia, bilateral: Secondary | ICD-10-CM

## 2023-02-04 DIAGNOSIS — I10 Essential (primary) hypertension: Secondary | ICD-10-CM | POA: Diagnosis not present

## 2023-02-04 MED ORDER — CIPROFLOXACIN-DEXAMETHASONE 0.3-0.1 % OT SUSP: 4.0000 [drp] | Freq: Two times a day (BID) | OTIC | 0 refills | Status: DC

## 2023-02-04 NOTE — Patient Instructions (Signed)
 It was very nice to see you today!  Please start the eardrops.  I will refer you to ENT.  Keep an eye on your blood pressure and let us  know if persistently elevated.  Return if symptoms worsen or fail to improve.   Take care, Dr Kennyth  PLEASE NOTE:  If you had any lab tests, please let us  know if you have not heard back within a few days. You may see your results on mychart before we have a chance to review them but we will give you a call once they are reviewed by us .   If we ordered any referrals today, please let us  know if you have not heard from their office within the next week.   If you had any urgent prescriptions sent in today, please check with the pharmacy within an hour of our visit to make sure the prescription was transmitted appropriately.   Please try these tips to maintain a healthy lifestyle:  Eat at least 3 REAL meals and 1-2 snacks per day.  Aim for no more than 5 hours between eating.  If you eat breakfast, please do so within one hour of getting up.   Each meal should contain half fruits/vegetables, one quarter protein, and one quarter carbs (no bigger than a computer mouse)  Cut down on sweet beverages. This includes juice, soda, and sweet tea.   Drink at least 1 glass of water with each meal and aim for at least 8 glasses per day  Exercise at least 150 minutes every week.

## 2023-02-04 NOTE — Progress Notes (Signed)
   Jane Cox is a 89 y.o. female who presents today for an office visit.  Assessment/Plan:  New/Acute Problems: Otalgia No red flags.  Overall reassuring exam.  Does have slight erythema on exam and may have mild otitis externa.  Will start Ciprodex  drops.  Given persistence of symptoms over the last several weeks would be reasonable for her to see ENT at this point.  Will place referral.  We discussed reasons to return to care or seek emergent care.  Chronic Problems Addressed Today: Essential Hypertension  Initially elevated to 190/74 today. On recheck 184/67.  Her blood pressure was at goal her last couple of office visits here.  May be elevated today in setting of acute pain.  We did discuss adjustment of medications however she has had significant side effects with medication adjustments in the past.  She will continue her current regimen with lisinopril  40 mg daily, amlodipine  5 mg daily, labetalol  100 mg twice daily, and HCTZ 12.5 mg daily.  She will continue monitor at home and let me or her PCP know if persistently elevated.    Subjective:  HPI:  See Assessment / plan for status of chronic conditions.  Patient is here with several weeks of right ear pain.  Was seen here a month ago by different provider.  At that time, there was concern for eustachian tube dysfunction.  She had recommended to start saline nasal spray and Flonase .  She has been consistent with this however has not had any improvement in her symptoms. She has had more muffled hearing in her right ear.        Objective:  Physical Exam: BP (!) 190/74   Pulse (!) 59   Temp (!) 97.3 F (36.3 C) (Temporal)   Ht 5' 1 (1.549 m)   Wt 140 lb 3.2 oz (63.6 kg)   SpO2 94%   BMI 26.49 kg/m   Gen: No acute distress, resting comfortably HEENT: Bilateral TMs clear.  Right EAC with small amount of erythema. CV: Regular rate and rhythm with no murmurs appreciated Pulm: Normal work of breathing, clear to auscultation  bilaterally with no crackles, wheezes, or rhonchi Neuro: Grossly normal, moves all extremities Psych: Normal affect and thought content      Kylin Genna M. Kennyth, MD 02/04/2023 1:50 PM

## 2023-02-05 ENCOUNTER — Encounter (INDEPENDENT_AMBULATORY_CARE_PROVIDER_SITE_OTHER): Payer: Self-pay | Admitting: Otolaryngology

## 2023-02-25 ENCOUNTER — Encounter: Payer: Self-pay | Admitting: Family Medicine

## 2023-02-25 ENCOUNTER — Ambulatory Visit (INDEPENDENT_AMBULATORY_CARE_PROVIDER_SITE_OTHER): Payer: Medicare Other | Admitting: Family Medicine

## 2023-02-25 VITALS — BP 139/57 | HR 92 | Temp 97.3°F | Ht 61.0 in | Wt 140.6 lb

## 2023-02-25 DIAGNOSIS — I10 Essential (primary) hypertension: Secondary | ICD-10-CM

## 2023-02-25 DIAGNOSIS — I701 Atherosclerosis of renal artery: Secondary | ICD-10-CM | POA: Diagnosis not present

## 2023-02-25 DIAGNOSIS — E538 Deficiency of other specified B group vitamins: Secondary | ICD-10-CM | POA: Diagnosis not present

## 2023-02-25 DIAGNOSIS — E559 Vitamin D deficiency, unspecified: Secondary | ICD-10-CM

## 2023-02-25 DIAGNOSIS — E785 Hyperlipidemia, unspecified: Secondary | ICD-10-CM | POA: Diagnosis not present

## 2023-02-25 NOTE — Progress Notes (Signed)
Phone [phone] In person visit   Subjective:   Jane Cox is a 89 y.o. year old very pleasant female patient who presents for/with See problem oriented charting Chief Complaint  Patient presents with   Medical Management of Chronic Issues   Hypertension   Hyperlipidemia    Past Medical History-  Patient Active Problem List   Diagnosis Date Noted   Vitamin D deficiency 03/05/2022    Priority: Medium    Renal artery stenosis (HCC) 12/15/2013    Priority: Medium    Hyperlipidemia 09/10/2006    Priority: Medium    Essential hypertension 08/18/2006    Priority: Medium    BREAST CANCER, HX OF 08/18/2006    Priority: Medium    Ruptured Bakers cyst 01/13/2018    Priority: Low   Skin cancer, basal cell 04/20/2015    Priority: Low   Lumbar degenerative disc disease 05/23/2014    Priority: Low   Arthritis of left hip 05/23/2014    Priority: Low   Greater trochanteric bursitis of right hip 05/23/2014    Priority: Low   Allergic rhinitis 08/18/2006    Priority: Low   Osteopenia 08/18/2006    Priority: Low    Medications- reviewed and updated Current Outpatient Medications  Medication Sig Dispense Refill   acetaminophen (TYLENOL) 650 MG CR tablet Take 650 mg by mouth every 8 (eight) hours as needed. Reported on 02/27/2015     amLODipine (NORVASC) 5 MG tablet Take 1 tablet (5 mg total) by mouth daily. 90 tablet 3   atorvastatin (LIPITOR) 20 MG tablet TAKE HALF TABLET BY MOUTH DAILY 45 tablet 1   Ayr Saline Nasal No-Drip GEL Place into the nose.     hydrochlorothiazide (MICROZIDE) 12.5 MG capsule TAKE 1 CAPSULE BY MOUTH EVERY DAY 90 capsule 1   labetalol (NORMODYNE) 100 MG tablet TAKE 1 TABLET BY MOUTH TWICE A DAY 180 tablet 3   lisinopril (ZESTRIL) 40 MG tablet TAKE 1 TABLET BY MOUTH EVERY DAY 90 tablet 1   No current facility-administered medications for this visit.     Objective:  BP (!) 139/57 Comment: most recent home reading from yesterday  Pulse 92   Temp  (!) 97.3 F (36.3 C)   Ht 5\' 1"  (1.549 m)   Wt 140 lb 9.6 oz (63.8 kg)   SpO2 97%   BMI 26.57 kg/m  Gen: NAD, resting comfortably CV: RRR no murmurs rubs or gallops Lungs: CTAB no crackles, wheeze, rhonchi Ext: no edema Skin: warm, dry     Assessment and Plan   #social update- still mourning loss of husband  #Ear pain- has been working through this. Saw Dr. Jimmey Ralph and much better on ear drops. Much better in day- still bothers at night. Plans to see Dr. Suszanne Conners next month. She thinks could be sinus related. Hearing isn't best in that ear so they are going to check.   #hypertension #Renal artery stenosis-unilateral and usually does not cause blood pressure issues-was released by cardiology in 2018-she declines further imaging S: medication: Amlodipine 5 mg, hydrochlorothiazide 12.5 mg, labetalol 100 mg twice daily, lisinopril 40 mg -leg cramps on 25 mg hydrochlorothiazide and blood pressure not better -swells on 10 mg amlodipine Home readings #s: 130s to 140's/50's at home  BP Readings from Last 3 Encounters:  02/25/23 (!) 139/57  02/04/23 (!) 184/67  01/10/23 (!) 140/70  A/P: blood pressure is on very high end of acceptable range with reading yesterday and many readings into 140's at homes . Max  tolerable dose of amlodipine, hydrochlorothiazide, and lisinopril. 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 Hopefully stable- need to control  lipids and blood pressure  Lipids at goal- due for full lipid panel next visit  #Vitamin D deficiency S: Medication: Has required high-dose vitamin D- never ended up taking the 2000 units of vitamin D we recommended last year Last vitamin D Lab Results  Component Value Date   VD25OH 29.17 (L) 03/05/2022  A/P: hopefully stable- update vitamin D today. Off medications- likely need to restart  # B12 deficiency-182 on 06/17/22 S: Current treatment/medication (oral vs. IM):  injections weekly for a month when discovered now oral B12 - later stopped Lab Results  Component Value Date   VITAMINB12 182 (L) 06/17/2022  A/P: may be low again off medications- update today  #prior epistaxis- no recent hematology visit and declines further follow up- had workup with extensive labs for coagulopathy  Recommended follow up: Return in about 6 months (around 08/25/2023) for followup or sooner if needed.Schedule b4 you leave. Future Appointments  Date Time Provider Department Center  03/26/2023  2:40 PM [facility] None    Lab/Order associations:   ICD-10-CM   1. Essential hypertension  I10     2. Hyperlipidemia, unspecified hyperlipidemia type  E78.5 Comprehensive metabolic panel    CBC with Differential/Platelet    Lipid panel    3. Renal artery stenosis (HCC)  I70.1     4. Vitamin D deficiency  E55.9 VITAMIN D 25 Hydroxy (Vit-D Deficiency, Fractures)    5. B12 deficiency  E53.8 Vitamin B12      No orders of the defined types were placed in this encounter.   Return precautions advised.  Tana Conch, MD

## 2023-02-25 NOTE — Patient Instructions (Addendum)
Get Mammogram scheduled.  Call vascular for follow up   Please stop by lab before you go If you have mychart- we will send your results within 3 business days of [place] receiving them.  If you do not have mychart- we will call you about results within 5 business days of [place] receiving them.  *please also note that you will see labs on mychart as soon as they post. I will later go in and write notes on them- will say "notes from Dr. Durene Cal"   Recommended follow up: Return in about 6 months (around 08/25/2023) for followup or sooner if needed.Schedule b4 you leave.

## 2023-02-26 ENCOUNTER — Other Ambulatory Visit: Payer: Self-pay | Admitting: Family Medicine

## 2023-02-26 ENCOUNTER — Encounter: Payer: Self-pay | Admitting: Family Medicine

## 2023-02-26 LAB — CBC WITH DIFFERENTIAL/PLATELET
Basophils Absolute: 0.1 10*3/uL (ref 0.0–0.1)
Basophils Relative: 1.1 % (ref 0.0–3.0)
Eosinophils Absolute: 0.2 10*3/uL (ref 0.0–0.7)
Eosinophils Relative: 2.4 % (ref 0.0–5.0)
HCT: 34.3 % — ABNORMAL LOW (ref 36.0–46.0)
Hemoglobin: 11.5 g/dL — ABNORMAL LOW (ref 12.0–15.0)
Lymphocytes Relative: 31.2 % (ref 12.0–46.0)
Lymphs Abs: 2.3 10*3/uL (ref 0.7–4.0)
MCHC: 33.5 g/dL (ref 30.0–36.0)
MCV: 87.2 fL (ref 78.0–100.0)
Monocytes Absolute: 0.6 10*3/uL (ref 0.1–1.0)
Monocytes Relative: 8.3 % (ref 3.0–12.0)
Neutro Abs: 4.3 10*3/uL (ref 1.4–7.7)
Neutrophils Relative %: 57 % (ref 43.0–77.0)
Platelets: 258 10*3/uL (ref 150.0–400.0)
RBC: 3.94 Mil/uL (ref 3.87–5.11)
RDW: 14.2 % (ref 11.5–15.5)
WBC: 7.5 10*3/uL (ref 4.0–10.5)

## 2023-02-26 LAB — COMPREHENSIVE METABOLIC PANEL
ALT: 15 U/L (ref 0–35)
AST: 16 U/L (ref 0–37)
Albumin: 4.6 g/dL (ref 3.5–5.2)
Alkaline Phosphatase: 108 U/L (ref 39–117)
BUN: 16 mg/dL (ref 6–23)
CO2: 26 meq/L (ref 19–32)
Calcium: 10 mg/dL (ref 8.4–10.5)
Chloride: 103 meq/L (ref 96–112)
Creatinine, Ser: 0.89 mg/dL (ref 0.40–1.20)
GFR: 58.23 mL/min — ABNORMAL LOW (ref >60.00)
Glucose, Bld: 105 mg/dL — ABNORMAL HIGH (ref 70–99)
Potassium: 4.1 meq/L (ref 3.5–5.1)
Sodium: 138 meq/L (ref 135–145)
Total Bilirubin: 0.8 mg/dL (ref 0.2–1.2)
Total Protein: 6.8 g/dL (ref 6.0–8.3)

## 2023-02-26 LAB — VITAMIN B12: Vitamin B-12: 208 pg/mL — ABNORMAL LOW (ref 211–911)

## 2023-02-26 LAB — LIPID PANEL
Cholesterol: 136 mg/dL (ref 0–200)
HDL: 53.7 mg/dL (ref >39.00)
LDL Cholesterol: 40 mg/dL (ref 0–99)
NonHDL: 82.08
Total CHOL/HDL Ratio: 3
Triglycerides: 210 mg/dL — ABNORMAL HIGH (ref 0.0–149.0)
VLDL: 42 mg/dL — ABNORMAL HIGH (ref 0.0–40.0)

## 2023-02-26 LAB — VITAMIN D 25 HYDROXY (VIT D DEFICIENCY, FRACTURES): VITD: 26.47 ng/mL — ABNORMAL LOW (ref 30.00–100.00)

## 2023-02-26 MED ORDER — VITAMIN D (ERGOCALCIFEROL) 1.25 MG (50000 UNIT) PO CAPS: 50000.0000 [IU] | ORAL_CAPSULE | ORAL | 0 refills | Status: DC

## 2023-03-06 ENCOUNTER — Other Ambulatory Visit: Payer: Self-pay

## 2023-03-06 ENCOUNTER — Encounter: Payer: Self-pay | Admitting: Family Medicine

## 2023-03-06 DIAGNOSIS — I774 Celiac artery compression syndrome: Secondary | ICD-10-CM

## 2023-03-25 ENCOUNTER — Ambulatory Visit: Payer: Medicare Other | Admitting: Family Medicine

## 2023-03-25 ENCOUNTER — Telehealth (INDEPENDENT_AMBULATORY_CARE_PROVIDER_SITE_OTHER): Payer: Self-pay | Admitting: Otolaryngology

## 2023-03-25 NOTE — Telephone Encounter (Signed)
 Reminder Call: Date: 03/26/2023 Status: Sch  Time: 2:40 PM [address] [place], [place] [address]  Left voicemail w/time and location

## 2023-03-25 NOTE — Telephone Encounter (Signed)
 Confirmed appt & location [id number] afm

## 2023-03-26 ENCOUNTER — Ambulatory Visit (INDEPENDENT_AMBULATORY_CARE_PROVIDER_SITE_OTHER): Payer: Medicare Other | Admitting: Otolaryngology

## 2023-03-26 ENCOUNTER — Encounter (INDEPENDENT_AMBULATORY_CARE_PROVIDER_SITE_OTHER): Payer: Self-pay

## 2023-03-26 VITALS — BP 187/64 | HR 72 | Ht 61.0 in | Wt 140.0 lb

## 2023-03-26 DIAGNOSIS — J310 Chronic rhinitis: Secondary | ICD-10-CM

## 2023-03-26 DIAGNOSIS — H6983 Other specified disorders of Eustachian tube, bilateral: Secondary | ICD-10-CM

## 2023-03-26 DIAGNOSIS — R0981 Nasal congestion: Secondary | ICD-10-CM | POA: Diagnosis not present

## 2023-03-27 DIAGNOSIS — H6983 Other specified disorders of Eustachian tube, bilateral: Secondary | ICD-10-CM | POA: Insufficient documentation

## 2023-03-27 DIAGNOSIS — J310 Chronic rhinitis: Secondary | ICD-10-CM | POA: Insufficient documentation

## 2023-03-27 NOTE — Progress Notes (Signed)
 Patient ID: Jane Cox, female   DOB: 02-22-1933, 89 y.o.   MRN: [id number]  CC: Bilateral ear pain, middle ear effusion  HPI:  Jane Cox is a 89 y.o. female who presents today for evaluation of her ear pain and middle ear effusion.  According to the patient, she noted bilateral otalgia in November 2024.  She was seen by her primary care provider, and was diagnosed with bilateral middle ear effusion.  She was treated with Flonase nasal spray.  The ear pain has resolved.  She still has occasional clogging sensation in her ears.  She has a history of bilateral sensorineural hearing loss.  She currently wears bilateral hearing aids.  She denies any otorrhea or vertigo.  Past Medical History:  Diagnosis Date   Allergy    Basal cell carcinoma    HX: breast cancer    Hyperlipidemia    Hypertension    Osteopenia     Past Surgical History:  Procedure Laterality Date   ABDOMINAL HYSTERECTOMY  1983   BREAST SURGERY     mastectomy   CHOLECYSTECTOMY  1993   COLONOSCOPY     VEIN SURGERY      Family History  Problem Relation Age of Onset   Lung cancer Father     Social History:  reports that she has never smoked. She has never used smokeless tobacco. She reports current alcohol use of about 1.0 standard drink of alcohol per week. She reports that she does not use drugs.  Allergies:  Allergies  Allergen Reactions   Bacitracin-Polymyxin B     REACTION: rash   Shellfish Allergy Nausea And Vomiting   Neosporin [Neomycin-Polymyxin-Gramicidin] Rash    Also gets blisters    Prior to Admission medications   Medication Sig Start Date End Date Taking? Authorizing Provider  acetaminophen (TYLENOL) 650 MG CR tablet Take 650 mg by mouth every 8 (eight) hours as needed. Reported on 02/27/2015   Yes [provider]  amLODipine (NORVASC) 5 MG tablet Take 1 tablet (5 mg total) by mouth daily. 01/13/23  Yes Shelva Majestic, MD  atorvastatin (LIPITOR) 20 MG tablet TAKE HALF TABLET BY  MOUTH DAILY 07/15/22  Yes Shelva Majestic, MD  Ayr Saline Nasal No-Drip GEL Place into the nose.   Yes [provider]  hydrochlorothiazide (MICROZIDE) 12.5 MG capsule TAKE 1 CAPSULE BY MOUTH EVERY DAY 07/15/22  Yes Shelva Majestic, MD  labetalol (NORMODYNE) 100 MG tablet TAKE 1 TABLET BY MOUTH TWICE A DAY 07/15/22  Yes Shelva Majestic, MD  lisinopril (ZESTRIL) 40 MG tablet TAKE 1 TABLET BY MOUTH EVERY DAY 10/21/22  Yes Shelva Majestic, MD  Vitamin D, Ergocalciferol, (DRISDOL) 1.25 MG (50000 UNIT) CAPS capsule Take 1 capsule (50,000 Units total) by mouth every 7 (seven) days. 02/26/23  Yes Shelva Majestic, MD    Blood pressure (!) 187/64, pulse 72, height 5\' 1"  (1.549 m), weight 140 lb (63.5 kg), SpO2 92%. Exam: General: Communicates without difficulty, well nourished, no acute distress. Head: Normocephalic, no evidence injury, no tenderness, facial buttresses intact without stepoff. Face/sinus: No tenderness to palpation and percussion. Facial movement is normal and symmetric. Eyes: PERRL, EOMI. No scleral icterus, conjunctivae clear. Neuro: CN II exam reveals vision grossly intact.  No nystagmus at any point of gaze. Ears: Auricles well formed without lesions.  Ear canals are intact without mass or lesion.  No erythema or edema is appreciated.  The TMs are intact without fluid. Nose: External evaluation reveals  normal support and skin without lesions.  Dorsum is intact.  Anterior rhinoscopy reveals congested mucosa over anterior aspect of inferior turbinates and intact septum.  No purulence noted. Oral:  Oral cavity and oropharynx are intact, symmetric, without erythema or edema.  Mucosa is moist without lesions. Neck: Full range of motion without pain.  There is no significant lymphadenopathy.  No masses palpable.  Thyroid bed within normal limits to palpation.  Parotid glands and submandibular glands equal bilaterally without mass.  Trachea is midline. Neuro:  CN 2-12 grossly intact.    Assessment: 1.  The patient's history is suggestive of bilateral eustachian tube dysfunction. 2.  Her ear canals, tympanic membranes, and middle ear spaces are normal.  Her previously noted middle ear effusion has resolved. 3.  Chronic rhinitis with nasal mucosal congestion.  Plan: 1.  The physical exam findings are reviewed with the patient. 2.  The patient is reassured that no infection is noted today. 3.  Continue the use of Flonase nasal spray as needed. 4.  The patient is encouraged to call with any questions or concerns.  Brentlee Delage W Jessamyn Watterson 03/27/2023, 10:10 AM

## 2023-04-06 ENCOUNTER — Other Ambulatory Visit: Payer: Self-pay | Admitting: Family Medicine

## 2023-04-16 ENCOUNTER — Other Ambulatory Visit: Payer: Self-pay

## 2023-04-16 DIAGNOSIS — K551 Chronic vascular disorders of intestine: Secondary | ICD-10-CM

## 2023-04-24 NOTE — Progress Notes (Unsigned)
 Patient ID: Jane Cox, female   DOB: [birth year], 89 y.o.   MRN: [id number]  Reason for Consult: No chief complaint on file.   Referred by Shelva Majestic, MD  Subjective:     HPI  Jane Cox is a 89 y.o. female ***  Past Medical History:  Diagnosis Date   Allergy    Basal cell carcinoma    HX: breast cancer    Hyperlipidemia    Hypertension    Osteopenia    Family History  Problem Relation Age of Onset   Lung cancer Father    Past Surgical History:  Procedure Laterality Date   ABDOMINAL HYSTERECTOMY  1983   BREAST SURGERY     mastectomy   CHOLECYSTECTOMY  1993   COLONOSCOPY     VEIN SURGERY      Short Social History:  Social History   Tobacco Use   Smoking status: Never   Smokeless tobacco: Never  Substance Use Topics   Alcohol use: Yes    Alcohol/week: 1.0 standard drink of alcohol    Types: 1 drink(s) per week    Allergies  Allergen Reactions   Bacitracin-Polymyxin B     REACTION: rash   Shellfish Allergy Nausea And Vomiting   Neosporin [Neomycin-Polymyxin-Gramicidin] Rash    Also gets blisters    Current Outpatient Medications  Medication Sig Dispense Refill   acetaminophen (TYLENOL) 650 MG CR tablet Take 650 mg by mouth every 8 (eight) hours as needed. Reported on 02/27/2015     amLODipine (NORVASC) 5 MG tablet Take 1 tablet (5 mg total) by mouth daily. 90 tablet 3   atorvastatin (LIPITOR) 20 MG tablet TAKE 1/2 TABLET BY MOUTH DAILY 45 tablet 1   Ayr Saline Nasal No-Drip GEL Place into the nose.     hydrochlorothiazide (MICROZIDE) 12.5 MG capsule TAKE 1 CAPSULE BY MOUTH EVERY DAY 90 capsule 1   labetalol (NORMODYNE) 100 MG tablet TAKE 1 TABLET BY MOUTH TWICE A DAY 180 tablet 3   lisinopril (ZESTRIL) 40 MG tablet TAKE 1 TABLET BY MOUTH EVERY DAY 90 tablet 1   Vitamin D, Ergocalciferol, (DRISDOL) 1.25 MG (50000 UNIT) CAPS capsule Take 1 capsule (50,000 Units total) by mouth every 7 (seven) days. 13 capsule 0   No current  facility-administered medications for this visit.    REVIEW OF SYSTEMS  Positive for ***  All other systems were reviewed and are negative     Objective:  Objective   There were no vitals filed for this visit. There is no height or weight on file to calculate BMI.  Physical Exam General: no acute distress Cardiac: hemodynamically stable Pulm: normal work of breathing Abdomen: non-tender, no pulsatile mass*** Neuro: alert, no focal deficit Extremities: no edema, cyanosis or wounds*** Vascular:   Right: ***  Left: ***   Data: Mesenteric duplex ***   Renal duplex 06/2022 Right: Normal size right kidney. Abnormal right Resistive Index.         Normal cortical thickness of right kidney. 1-59% stenosis of         the right renal artery. RRV flow present. Probable extrarenal         pelvis.  Left:  Normal size of left kidney. Abnormal left Resistive Index.         Normal cortical thickness of the left kidney. 1-59% stenosis         of the left renal artery. LRV flow present.  Mesenteric:  70 to 99% stenosis in  the celiac artery and superior mesenteric artery.    All are stable from 2017.   CMP reviewed, Cr 0.89  Lipid panel reviewed     Assessment/Plan:     Jane Cox is a 89 y.o. female ***    Recommendations to optimize cardiovascular risk: Abstinence from all tobacco products. Blood glucose control with goal A1c < 7%. Blood pressure control with goal blood pressure < 140/90 mmHg. Lipid reduction therapy with goal LDL-C <100 mg/dL  Aspirin 81mg  PO QD.  Atorvastatin 40-80mg  PO QD (or other "high intensity" statin therapy).     Daria Pastures MD [facility]

## 2023-04-25 ENCOUNTER — Ambulatory Visit: Payer: Medicare Other | Admitting: Vascular Surgery

## 2023-04-25 ENCOUNTER — Encounter: Payer: Self-pay | Admitting: Vascular Surgery

## 2023-04-25 ENCOUNTER — Ambulatory Visit (HOSPITAL_COMMUNITY)
Admission: RE | Admit: 2023-04-25 | Discharge: 2023-04-25 | Disposition: A | Discharge status: Home / Self Care | Payer: Medicare Other | Source: Ambulatory Visit | Attending: Vascular Surgery | Admitting: Vascular Surgery

## 2023-04-25 VITALS — BP 197/71 | HR 61 | Temp 97.9°F | Resp 20 | Ht 61.0 in | Wt 143.4 lb

## 2023-04-25 DIAGNOSIS — I701 Atherosclerosis of renal artery: Secondary | ICD-10-CM | POA: Diagnosis not present

## 2023-04-25 DIAGNOSIS — K551 Chronic vascular disorders of intestine: Secondary | ICD-10-CM

## 2023-04-28 ENCOUNTER — Other Ambulatory Visit: Payer: Self-pay

## 2023-04-28 DIAGNOSIS — I6523 Occlusion and stenosis of bilateral carotid arteries: Secondary | ICD-10-CM

## 2023-05-23 ENCOUNTER — Other Ambulatory Visit: Payer: Self-pay | Admitting: Family Medicine

## 2023-05-27 ENCOUNTER — Telehealth: Payer: Self-pay | Admitting: Family Medicine

## 2023-05-27 NOTE — Telephone Encounter (Signed)
 Patient dropped off document Handicap Placard, to be filled out by provider. Patient requested to send it back via Mail within 5-days. Document is located in providers tray at front office.Please advise at Mobile [phone] (mobile)

## 2023-05-28 ENCOUNTER — Other Ambulatory Visit: Payer: Self-pay

## 2023-05-28 ENCOUNTER — Emergency Department (HOSPITAL_COMMUNITY)
Admission: EM | Admit: 2023-05-28 | Discharge: 2023-05-29 | Disposition: A | Discharge status: Home / Self Care | Attending: Emergency Medicine | Admitting: Emergency Medicine

## 2023-05-28 ENCOUNTER — Emergency Department (HOSPITAL_COMMUNITY)

## 2023-05-28 DIAGNOSIS — I10 Essential (primary) hypertension: Secondary | ICD-10-CM | POA: Insufficient documentation

## 2023-05-28 DIAGNOSIS — I6529 Occlusion and stenosis of unspecified carotid artery: Secondary | ICD-10-CM | POA: Diagnosis not present

## 2023-05-28 DIAGNOSIS — S40811A Abrasion of right upper arm, initial encounter: Secondary | ICD-10-CM | POA: Diagnosis not present

## 2023-05-28 DIAGNOSIS — S199XXA Unspecified injury of neck, initial encounter: Secondary | ICD-10-CM | POA: Diagnosis not present

## 2023-05-28 DIAGNOSIS — S40812A Abrasion of left upper arm, initial encounter: Secondary | ICD-10-CM | POA: Diagnosis not present

## 2023-05-28 DIAGNOSIS — Z79899 Other long term (current) drug therapy: Secondary | ICD-10-CM | POA: Insufficient documentation

## 2023-05-28 DIAGNOSIS — W100XXA Fall (on)(from) escalator, initial encounter: Secondary | ICD-10-CM | POA: Diagnosis not present

## 2023-05-28 DIAGNOSIS — R030 Elevated blood-pressure reading, without diagnosis of hypertension: Secondary | ICD-10-CM | POA: Insufficient documentation

## 2023-05-28 DIAGNOSIS — W19XXXA Unspecified fall, initial encounter: Secondary | ICD-10-CM | POA: Diagnosis not present

## 2023-05-28 DIAGNOSIS — S0003XA Contusion of scalp, initial encounter: Secondary | ICD-10-CM | POA: Diagnosis not present

## 2023-05-28 DIAGNOSIS — I672 Cerebral atherosclerosis: Secondary | ICD-10-CM | POA: Diagnosis not present

## 2023-05-28 DIAGNOSIS — S50811A Abrasion of right forearm, initial encounter: Secondary | ICD-10-CM | POA: Insufficient documentation

## 2023-05-28 DIAGNOSIS — S0990XA Unspecified injury of head, initial encounter: Secondary | ICD-10-CM | POA: Diagnosis not present

## 2023-05-28 LAB — CBC
HCT: 35.5 % — ABNORMAL LOW (ref 36.0–46.0)
Hemoglobin: 11.5 g/dL — ABNORMAL LOW (ref 12.0–15.0)
MCH: 28.1 pg (ref 26.0–34.0)
MCHC: 32.4 g/dL (ref 30.0–36.0)
MCV: 86.8 fL (ref 80.0–100.0)
Platelets: 305 10*3/uL (ref 150–400)
RBC: 4.09 MIL/uL (ref 3.87–5.11)
RDW: 14 % (ref 11.5–15.5)
WBC: 15.5 10*3/uL — ABNORMAL HIGH (ref 4.0–10.5)
nRBC: 0 % (ref 0.0–0.2)

## 2023-05-28 LAB — BASIC METABOLIC PANEL WITH GFR
Anion gap: 10 (ref 5–15)
BUN: 19 mg/dL (ref 8–23)
CO2: 23 mmol/L (ref 22–32)
Calcium: 9.7 mg/dL (ref 8.9–10.3)
Chloride: 102 mmol/L (ref 98–111)
Creatinine, Ser: 0.81 mg/dL (ref 0.44–1.00)
GFR, Estimated: >60 mL/min (ref >60)
Glucose, Bld: 146 mg/dL — ABNORMAL HIGH (ref 70–99)
Potassium: 3.6 mmol/L (ref 3.5–5.1)
Sodium: 135 mmol/L (ref 135–145)

## 2023-05-28 MED ORDER — HYDROCODONE-ACETAMINOPHEN 5-325 MG PO TABS
1.0000 | ORAL_TABLET | Freq: Once | ORAL | Status: DC
  Filled 2023-05-28: qty 1

## 2023-05-28 NOTE — ED Provider Notes (Incomplete)
 [place] EMERGENCY DEPARTMENT AT [facility] Provider Note   CSN: [id number] Arrival date & time: 05/28/23  1725     History {Add pertinent medical, surgical, social history, OB history to HPI:1} Chief Complaint  Patient presents with  . Fall    Pt arrives today s/p [place] mall. Headstrike+, thinners-, LOC-. Has a large egg on head, and scratches/abrasions on both elbows, both of which are wrapped up. Had c collar on, cleared by provider. Hx htn 193/80. Ambulatory, able to stand from wheelchair to urinate.     Dorene N Bloomquist is a 89 y.o. female.  Patient with history of hypertension, hyperlipidemia presents today with complaints of mechanical fall. She states that same occurred earlier today when she was on an escalator and lost her footing and fell backwards and hit her head on the step.  She did not lose consciousness.  She is not anticoagulated.  She does note that the escalator caught her shirt and was pulling her when bystanders stopped the escalator.  She did sustain some superficial wounds to her arms.  Her tetanus is reportedly up-to-date.  She has been able to walk since the fall without significant pain.  She denies headaches, neck pain, vision changes, nausea, or vomiting. Additionally, patient noted that she has not had her evening blood pressure medications. Denies chest pain, shortness of breath.   The history is provided by the patient. No language interpreter was used.  Fall       Home Medications Prior to Admission medications   Medication Sig Start Date End Date Taking? Authorizing Provider  acetaminophen (TYLENOL) 650 MG CR tablet Take 650 mg by mouth every 8 (eight) hours as needed. Reported on 02/27/2015    [provider]  amLODipine  (NORVASC ) 5 MG tablet Take 1 tablet (5 mg total) by mouth daily. 01/13/23   Almira Jaeger, MD  atorvastatin  (LIPITOR) 20 MG tablet TAKE 1/2 TABLET BY MOUTH DAILY 04/07/23   Almira Jaeger, MD  Ayr Saline Nasal No-Drip GEL Place into the nose.    [provider]  hydrochlorothiazide  (MICROZIDE ) 12.5 MG capsule TAKE 1 CAPSULE BY MOUTH EVERY DAY 04/07/23   Almira Jaeger, MD  labetalol  (NORMODYNE ) 100 MG tablet TAKE 1 TABLET BY MOUTH TWICE A DAY 07/15/22   Almira Jaeger, MD  lisinopril  (ZESTRIL ) 40 MG tablet TAKE 1 TABLET BY MOUTH EVERY DAY 04/07/23   Almira Jaeger, MD  Vitamin D , Ergocalciferol , (DRISDOL ) 1.25 MG (50000 UNIT) CAPS capsule Take 1 capsule (50,000 Units total) by mouth every 7 (seven) days. 02/26/23   Almira Jaeger, MD      Allergies    Bacitracin-polymyxin b, Shellfish allergy, and Neosporin [neomycin-polymyxin-gramicidin]    Review of Systems   Review of Systems  All other systems reviewed and are negative.   Physical Exam Updated Vital Signs BP (!) 178/65   Pulse 83   Temp 98.3 F (36.8 C) (Oral)   Resp 16   SpO2 93%  Physical Exam Vitals and nursing note reviewed.  Constitutional:      General: She is not in acute distress.    Appearance: Normal appearance. She is normal weight. She is not ill-appearing, toxic-appearing or diaphoretic.  HENT:     Head: Normocephalic and atraumatic.     Comments: No racoon eyes No battle sign  Hematoma noted to the left parietal scalp.  No overlying wounds.  No crepitus or deformity. Eyes:  Extraocular Movements: Extraocular movements intact.     Pupils: Pupils are equal, round, and reactive to light.  Cardiovascular:     Rate and Rhythm: Normal rate.     Comments: No tenderness to palpation of the anterior chest wall Pulmonary:     Effort: Pulmonary effort is normal. No respiratory distress.  Abdominal:     Comments: No abdominal tenderness or bruising  Musculoskeletal:        General: Normal range of motion.     Cervical back: Normal and normal range of motion.     Thoracic back: Normal.     Lumbar back: Normal.     Comments: No midline tenderness, no stepoffs or  deformity noted on palpation of cervical, thoracic, and lumbar spine  Skin:    General: Skin is warm and dry.     Comments: Right forearm with a superficial skin abrasion without deformity.  No active bleeding.  Radial pulse intact 2+.  Compartments soft.  ROM intact with minimal discomfort.  Left upper arm with several small superficial abrasions.  No active bleeding.  Compartments soft.  Radial pulses intact and 2+.  ROM intact with minimal discomfort.  No deformity.  Neurological:     General: No focal deficit present.     Mental Status: She is alert and oriented to person, place, and time.  Psychiatric:        Mood and Affect: Mood normal.        Behavior: Behavior normal.     ED Results / Procedures / Treatments   Labs (all labs ordered are listed, but only abnormal results are displayed) Labs Reviewed  BASIC METABOLIC PANEL WITH GFR - Abnormal; Notable for the following components:      Result Value   Glucose, Bld 146 (*)    All other components within normal limits  CBC - Abnormal; Notable for the following components:   WBC 15.5 (*)    Hemoglobin 11.5 (*)    HCT 35.5 (*)    All other components within normal limits    EKG None  Radiology CT Head Wo Contrast Result Date: 05/28/2023 CLINICAL DATA:  Fall with head injury. Stepped [place] escalator at the mall and fell backwards and hit back of head on the escalator step. EXAM: CT HEAD WITHOUT CONTRAST CT CERVICAL SPINE WITHOUT CONTRAST TECHNIQUE: Multidetector CT imaging of the head and cervical spine was performed following the standard protocol without intravenous contrast. Multiplanar CT image reconstructions of the cervical spine were also generated. RADIATION DOSE REDUCTION: This exam was performed according to the departmental dose-optimization program which includes automated exposure control, adjustment of the mA and/or kV according to patient size and/or use of iterative reconstruction technique. COMPARISON:  None  Available. FINDINGS: CT HEAD FINDINGS Brain: No intracranial hemorrhage, mass effect, or evidence of acute infarct. No hydrocephalus. No extra-axial fluid collection. Age related cerebral atrophy and chronic small vessel ischemic disease. Vascular: No hyperdense vessel. Intracranial arterial calcification. Skull: No fracture or focal lesion.  Left posterior scalp hematoma. Sinuses/Orbits: No acute finding. Other: None. CT CERVICAL SPINE FINDINGS Alignment: No evidence of traumatic malalignment. Skull base and vertebrae: No acute fracture. No primary bone lesion or focal pathologic process. Soft tissues and spinal canal: No prevertebral fluid or swelling. No visible canal hematoma. Disc levels: Multilevel age commensurate spondylosis, disc space height loss, and degenerative endplate changes. No severe spinal canal narrowing. Upper chest: No acute abnormality. Other: Carotid calcification. IMPRESSION: 1. No acute intracranial abnormality. 2. No acute fracture  in the cervical spine. Electronically Signed   By: Rozell Cornet M.D.   On: 05/28/2023 21:41   CT Cervical Spine Wo Contrast Result Date: 05/28/2023 CLINICAL DATA:  Fall with head injury. Stepped off escalator at the mall and fell backwards and hit back of head on the escalator step. EXAM: CT HEAD WITHOUT CONTRAST CT CERVICAL SPINE WITHOUT CONTRAST TECHNIQUE: Multidetector CT imaging of the head and cervical spine was performed following the standard protocol without intravenous contrast. Multiplanar CT image reconstructions of the cervical spine were also generated. RADIATION DOSE REDUCTION: This exam was performed according to the departmental dose-optimization program which includes automated exposure control, adjustment of the mA and/or kV according to patient size and/or use of iterative reconstruction technique. COMPARISON:  None Available. FINDINGS: CT HEAD FINDINGS Brain: No intracranial hemorrhage, mass effect, or evidence of acute infarct. No  hydrocephalus. No extra-axial fluid collection. Age related cerebral atrophy and chronic small vessel ischemic disease. Vascular: No hyperdense vessel. Intracranial arterial calcification. Skull: No fracture or focal lesion.  Left posterior scalp hematoma. Sinuses/Orbits: No acute finding. Other: None. CT CERVICAL SPINE FINDINGS Alignment: No evidence of traumatic malalignment. Skull base and vertebrae: No acute fracture. No primary bone lesion or focal pathologic process. Soft tissues and spinal canal: No prevertebral fluid or swelling. No visible canal hematoma. Disc levels: Multilevel age commensurate spondylosis, disc space height loss, and degenerative endplate changes. No severe spinal canal narrowing. Upper chest: No acute abnormality. Other: Carotid calcification. IMPRESSION: 1. No acute intracranial abnormality. 2. No acute fracture in the cervical spine. Electronically Signed   By: Rozell Cornet M.D.   On: 05/28/2023 21:41    Procedures Procedures  {Document cardiac monitor, telemetry assessment procedure when appropriate:1}  Medications Ordered in ED Medications  HYDROcodone-acetaminophen (NORCO/VICODIN) 5-325 MG per tablet 1 tablet (1 tablet Oral Patient Refused/Not Given 05/28/23 1925)    ED Course/ Medical Decision Making/ A&P   {   Click here for ABCD2, HEART and other calculatorsREFRESH Note before signing :1}                              Medical Decision Making  This patient is a 89 y.o. female who presents to the ED for concern of mechanical fall, this involves an extensive number of treatment options, and is a complaint that carries with it a high risk of complications and morbidity. The emergent differential diagnosis prior to evaluation includes, but is not limited to,  trauma . This is not an exhaustive differential.   Past Medical History / Co-morbidities / Social History:  has a past medical history of Allergy, Basal cell carcinoma, breast cancer, Hyperlipidemia,  Hypertension, and Osteopenia.  Additional history: Chart reviewed.   Physical Exam: Physical exam performed. The pertinent findings include: per above, hematoma noted to left parietal scalp, superficial skin abrasions noted to bilateral upper arms. No bleeding or deformity.   Lab Tests: I ordered, and personally interpreted labs.  The pertinent results include:  WBC 15.5 likely reactive due to trauma. Hgb 11.5 consistent with previous. Glucose 146   Imaging Studies: I ordered imaging studies including ***. I independently visualized and interpreted imaging which showed ***. I agree with the radiologist interpretation.   Cardiac Monitoring:  The patient was maintained on a cardiac monitor.  My attending physician Dr. Aaron Aas viewed and interpreted the cardiac monitored which showed an underlying rhythm of: ***. I agree with this interpretation.   Medications: I ordered  medication including ***  for ***. Reevaluation of the patient after these medicines showed that the patient {resolved/improved/worsened:23923::"improved"}. I have reviewed the patients home medicines and have made adjustments as needed.  Consultations Obtained: I requested consultation with the ***,  and discussed lab and imaging findings as well as pertinent plan - they recommend: ***   Disposition: After consideration of the diagnostic results and the patients response to treatment, I feel that *** .   ***emergency department workup does not suggest an emergent condition requiring admission or immediate intervention beyond what has been performed at this time. The plan is: ***. The patient is safe for discharge and has been instructed to return immediately for worsening symptoms, change in symptoms or any other concerns.  I discussed this case with my attending physician Dr. Aaron Aas who cosigned this note including patient's presenting symptoms, physical exam, and planned diagnostics and interventions. Attending physician stated  agreement with plan or made changes to plan which were implemented.     {Document critical care time when appropriate:1} {Document review of labs and clinical decision tools ie heart score, Chads2Vasc2 etc:1}  {Document your independent review of radiology images, and any outside records:1} {Document your discussion with family members, caretakers, and with consultants:1} {Document social determinants of health affecting pt's care:1} {Document your decision making why or why not admission, treatments were needed:1} Final Clinical Impression(s) / ED Diagnoses Final diagnoses:  None    Rx / DC Orders ED Discharge Orders     None

## 2023-05-28 NOTE — ED Provider Notes (Signed)
 [place] EMERGENCY DEPARTMENT AT [facility] Provider Note   CSN: [id number] Arrival date & time: 05/28/23  1725     History  Chief Complaint  Patient presents with   Fall    Pt arrives today [place]p [place] mall. Headstrike+, thinners-, LOC-. Has a large egg on head, and scratches/abrasions on both elbows, both of which are wrapped up. Had c collar on, cleared by provider. Hx htn 193/80. Ambulatory, able to stand from wheelchair to urinate.     Jane Cox is a 89 y.o. female.  Patient with history of hypertension, hyperlipidemia presents today with complaints of mechanical fall. She states that same occurred earlier today when she was on an escalator and lost her footing and fell backwards and hit her head on the step.  She did not lose consciousness.  She is not anticoagulated.  She does note that the escalator caught her shirt and was pulling her when bystanders stopped the escalator.  She did sustain some superficial wounds to her arms.  Her tetanus is reportedly up-to-date.  She has been able to walk since the fall without significant pain.  She denies headaches, neck pain, vision changes, nausea, or vomiting. Additionally, patient noted that she has not had her evening blood pressure medications. Denies chest pain, shortness of breath.   The history is provided by the patient. No language interpreter was used.  Fall       Home Medications Prior to Admission medications   Medication Sig Start Date End Date Taking? Authorizing Provider  acetaminophen  (TYLENOL ) 650 MG CR tablet Take 650 mg by mouth every 8 (eight) hours as needed. Reported on 02/27/2015    [provider]  amLODipine  (NORVASC ) 5 MG tablet Take 1 tablet (5 mg total) by mouth daily. 01/13/23   Almira Jaeger, MD  atorvastatin  (LIPITOR) 20 MG tablet TAKE 1/2 TABLET BY MOUTH DAILY 04/07/23   Almira Jaeger, MD  Ayr Saline Nasal No-Drip GEL Place into the nose.     [provider]  hydrochlorothiazide  (MICROZIDE ) 12.5 MG capsule TAKE 1 CAPSULE BY MOUTH EVERY DAY 04/07/23   Almira Jaeger, MD  labetalol  (NORMODYNE ) 100 MG tablet TAKE 1 TABLET BY MOUTH TWICE A DAY 07/15/22   Almira Jaeger, MD  lisinopril  (ZESTRIL ) 40 MG tablet TAKE 1 TABLET BY MOUTH EVERY DAY 04/07/23   Almira Jaeger, MD  Vitamin D , Ergocalciferol , (DRISDOL ) 1.25 MG (50000 UNIT) CAPS capsule Take 1 capsule (50,000 Units total) by mouth every 7 (seven) days. 02/26/23   Almira Jaeger, MD      Allergies    Bacitracin-polymyxin b, Shellfish allergy, and Neosporin [neomycin-polymyxin-gramicidin]    Review of Systems   Review of Systems  All other systems reviewed and are negative.   Physical Exam Updated Vital Signs BP (!) 178/65   Pulse 83   Temp 98.3 F (36.8 C) (Oral)   Resp 16   SpO2 93%  Physical Exam Vitals and nursing note reviewed.  Constitutional:      General: She is not in acute distress.    Appearance: Normal appearance. She is normal weight. She is not ill-appearing, toxic-appearing or diaphoretic.  HENT:     Head: Normocephalic and atraumatic.     Comments: No racoon eyes No battle sign  Hematoma noted to the left parietal scalp.  No overlying wounds.  No crepitus or deformity. Eyes:     Extraocular Movements: Extraocular movements intact.  Pupils: Pupils are equal, round, and reactive to light.  Cardiovascular:     Rate and Rhythm: Normal rate.     Comments: No tenderness to palpation of the anterior chest wall Pulmonary:     Effort: Pulmonary effort is normal. No respiratory distress.  Abdominal:     Comments: No abdominal tenderness or bruising  Musculoskeletal:        General: Normal range of motion.     Cervical back: Normal and normal range of motion.     Thoracic back: Normal.     Lumbar back: Normal.     Comments: No midline tenderness, no stepoffs or deformity noted on palpation of cervical, thoracic, and lumbar spine   Skin:    General: Skin is warm and dry.     Comments: Right forearm with a superficial skin abrasion without deformity.  No active bleeding.  Radial pulse intact 2+.  Compartments soft.  ROM intact with minimal discomfort.  Left upper arm with several small superficial abrasions.  No active bleeding.  Compartments soft.  Radial pulses intact and 2+.  ROM intact with minimal discomfort.  No deformity.  Neurological:     General: No focal deficit present.     Mental Status: She is alert and oriented to person, place, and time.  Psychiatric:        Mood and Affect: Mood normal.        Behavior: Behavior normal.     ED Results / Procedures / Treatments   Labs (all labs ordered are listed, but only abnormal results are displayed) Labs Reviewed  BASIC METABOLIC PANEL WITH GFR - Abnormal; Notable for the following components:      Result Value   Glucose, Bld 146 (*)    All other components within normal limits  CBC - Abnormal; Notable for the following components:   WBC 15.5 (*)    Hemoglobin 11.5 (*)    HCT 35.5 (*)    All other components within normal limits    EKG None  Radiology CT Head Wo Contrast Result Date: 05/28/2023 CLINICAL DATA:  Fall with head injury. Stepped [place] escalator at the mall and fell backwards and hit back of head on the escalator step. EXAM: CT HEAD WITHOUT CONTRAST CT CERVICAL SPINE WITHOUT CONTRAST TECHNIQUE: Multidetector CT imaging of the head and cervical spine was performed following the standard protocol without intravenous contrast. Multiplanar CT image reconstructions of the cervical spine were also generated. RADIATION DOSE REDUCTION: This exam was performed according to the departmental dose-optimization program which includes automated exposure control, adjustment of the mA and/or kV according to patient size and/or use of iterative reconstruction technique. COMPARISON:  None Available. FINDINGS: CT HEAD FINDINGS Brain: No intracranial hemorrhage,  mass effect, or evidence of acute infarct. No hydrocephalus. No extra-axial fluid collection. Age related cerebral atrophy and chronic small vessel ischemic disease. Vascular: No hyperdense vessel. Intracranial arterial calcification. Skull: No fracture or focal lesion.  Left posterior scalp hematoma. Sinuses/Orbits: No acute finding. Other: None. CT CERVICAL SPINE FINDINGS Alignment: No evidence of traumatic malalignment. Skull base and vertebrae: No acute fracture. No primary bone lesion or focal pathologic process. Soft tissues and spinal canal: No prevertebral fluid or swelling. No visible canal hematoma. Disc levels: Multilevel age commensurate spondylosis, disc space height loss, and degenerative endplate changes. No severe spinal canal narrowing. Upper chest: No acute abnormality. Other: Carotid calcification. IMPRESSION: 1. No acute intracranial abnormality. 2. No acute fracture in the cervical spine. Electronically Signed   By:  Rozell Cornet M.D.   On: 05/28/2023 21:41   CT Cervical Spine Wo Contrast Result Date: 05/28/2023 CLINICAL DATA:  Fall with head injury. Stepped [place] escalator at the mall and fell backwards and hit back of head on the escalator step. EXAM: CT HEAD WITHOUT CONTRAST CT CERVICAL SPINE WITHOUT CONTRAST TECHNIQUE: Multidetector CT imaging of the head and cervical spine was performed following the standard protocol without intravenous contrast. Multiplanar CT image reconstructions of the cervical spine were also generated. RADIATION DOSE REDUCTION: This exam was performed according to the departmental dose-optimization program which includes automated exposure control, adjustment of the mA and/or kV according to patient size and/or use of iterative reconstruction technique. COMPARISON:  None Available. FINDINGS: CT HEAD FINDINGS Brain: No intracranial hemorrhage, mass effect, or evidence of acute infarct. No hydrocephalus. No extra-axial fluid collection. Age related cerebral atrophy  and chronic small vessel ischemic disease. Vascular: No hyperdense vessel. Intracranial arterial calcification. Skull: No fracture or focal lesion.  Left posterior scalp hematoma. Sinuses/Orbits: No acute finding. Other: None. CT CERVICAL SPINE FINDINGS Alignment: No evidence of traumatic malalignment. Skull base and vertebrae: No acute fracture. No primary bone lesion or focal pathologic process. Soft tissues and spinal canal: No prevertebral fluid or swelling. No visible canal hematoma. Disc levels: Multilevel age commensurate spondylosis, disc space height loss, and degenerative endplate changes. No severe spinal canal narrowing. Upper chest: No acute abnormality. Other: Carotid calcification. IMPRESSION: 1. No acute intracranial abnormality. 2. No acute fracture in the cervical spine. Electronically Signed   By: Rozell Cornet M.D.   On: 05/28/2023 21:41    Procedures Procedures    Medications Ordered in ED Medications  HYDROcodone -acetaminophen  (NORCO/VICODIN) 5-325 MG per tablet 1 tablet (1 tablet Oral Patient Refused/Not Given 05/28/23 1925)    ED Course/ Medical Decision Making/ A&P                                 Medical Decision Making  This patient is a 89 y.o. female who presents to the ED for concern of mechanical fall, this involves an extensive number of treatment options, and is a complaint that carries with it a high risk of complications and morbidity. The emergent differential diagnosis prior to evaluation includes, but is not limited to,  trauma . This is not an exhaustive differential.   Past Medical History / Co-morbidities / Social History:  has a past medical history of Allergy, Basal cell carcinoma, breast cancer, Hyperlipidemia, Hypertension, and Osteopenia.  Additional history: Chart reviewed.   Physical Exam: Physical exam performed. The pertinent findings include: per above, hematoma noted to left parietal scalp, superficial skin abrasions noted to bilateral  upper arms. No bleeding or deformity.   Lab Tests: I ordered, and personally interpreted labs.  The pertinent results include:  WBC 15.5 likely reactive due to trauma. Hgb 11.5 consistent with previous. Glucose 146   Imaging Studies: I ordered imaging studies including CT head, cervical spine. I independently visualized and interpreted imaging which showed   1. No acute intracranial abnormality. 2. No acute fracture in the cervical spine.  I agree with the radiologist interpretation.   Medications: Offered pain medications which patient declined  Disposition: After consideration of the diagnostic results and the patients response to treatment, I feel that emergency department workup does not suggest an emergent condition requiring admission or immediate intervention beyond what has been performed at this time. The plan is: Discharge with close  outpatient follow-up and return precautions.  Patient is able to ambulate without pain.  Her wounds have been dressed.  Instructions for good dressing changes at home have been discussed.  She has been provided with dressing change materials as well.  She feels ready to go home.  Recommend OTC pain control and close outpatient follow-up with return precautions.  Additionally, patient's blood pressure was notably elevated, however she has not had her evening medication.  Recommend she take this as soon as she gets home.  Evaluation and diagnostic testing in the emergency department does not suggest an emergent condition requiring admission or immediate intervention beyond what has been performed at this time.  Plan for discharge with close PCP follow-up.  Patient is understanding and amenable with plan, educated on red flag symptoms that would prompt immediate return.  Patient discharged in stable condition.  Final Clinical Impression(s) / ED Diagnoses Final diagnoses:  Fall, initial encounter  Abrasion of left upper extremity, initial encounter  Abrasion  of right upper extremity, initial encounter  Elevated blood pressure reading    Rx / DC Orders ED Discharge Orders     None     An After Visit Summary was printed and given to the patient.     Sherra Dk, PA-C 05/29/23 0008    Rory Collard, MD 05/31/23 [id number]

## 2023-05-28 NOTE — ED Provider Triage Note (Addendum)
 Emergency Medicine Provider Triage Evaluation Note  Jane Cox , a 89 y.o. female  was evaluated in triage.  Pt complains of fall and head injury.  States [place] and [place] and hit the back of her head on the escalator step.  Did not lose consciousness.  Also has abrasions to her arms. Not on blood thinner.  Review of Systems  Positive: See above Negative: See above  Physical Exam  BP (!) 195/72   Pulse 82   Temp 97.9 F (36.6 C) (Oral)   Resp 16   SpO2 96%  Gen:   Awake, no distress   Resp:  Normal effort  MSK:   Moves extremities without difficulty  Other:    Medical Decision Making  Medically screening exam initiated at 6:30 PM.  Appropriate orders placed.  Jane Cox was informed that the remainder of the evaluation will be completed by another provider, this initial triage assessment does not replace that evaluation, and the importance of remaining in the ED until their evaluation is complete.  See above   Jane Mcmurray, PA-C 05/28/23 1833    Jane Mcmurray, PA-C 05/28/23 1836

## 2023-05-29 NOTE — Discharge Instructions (Addendum)
 As we discussed, your workup today was reassuring for acute findings.  Laboratory evaluation and CT imaging did not reveal any emergent concerns today.  You do have wounds on both of your arms.  It is very important that you clean these thoroughly with soap and water and do regular dressing changes with materials provided for you today.  You will probably be more sore tomorrow and this is to be expected.  Please take over-the-counter pain medicine as needed.  Please follow-up closely with your primary care provider.  Return if development of any new or worsening symptoms.

## 2023-05-29 NOTE — Telephone Encounter (Signed)
 Placard has been completed and mailed to pt per pt request.

## 2023-05-29 NOTE — ED Notes (Signed)
 Provider aware of discharge vitals. Clears pt for d/c. Pt states she will take HTN when she gets home.

## 2023-06-03 ENCOUNTER — Encounter: Payer: Self-pay | Admitting: Family Medicine

## 2023-06-03 ENCOUNTER — Ambulatory Visit: Admitting: Family Medicine

## 2023-06-03 VITALS — BP 156/54 | HR 68 | Temp 97.3°F | Ht 61.0 in | Wt 143.6 lb

## 2023-06-03 DIAGNOSIS — Z23 Encounter for immunization: Secondary | ICD-10-CM | POA: Diagnosis not present

## 2023-06-03 DIAGNOSIS — S4991XS Unspecified injury of right shoulder and upper arm, sequela: Secondary | ICD-10-CM | POA: Diagnosis not present

## 2023-06-03 DIAGNOSIS — S40812A Abrasion of left upper arm, initial encounter: Secondary | ICD-10-CM | POA: Diagnosis not present

## 2023-06-03 DIAGNOSIS — M79601 Pain in right arm: Secondary | ICD-10-CM

## 2023-06-03 DIAGNOSIS — M79602 Pain in left arm: Secondary | ICD-10-CM | POA: Diagnosis not present

## 2023-06-03 DIAGNOSIS — W19XXXD Unspecified fall, subsequent encounter: Secondary | ICD-10-CM | POA: Diagnosis not present

## 2023-06-03 DIAGNOSIS — S41119S Laceration without foreign body of unspecified upper arm, sequela: Secondary | ICD-10-CM

## 2023-06-03 NOTE — Progress Notes (Signed)
   Jane Cox is a 89 y.o. female who presents today for an office visit.  Assessment/Plan:  Arm Pain / Laceration She has numerous healing skin tears on bilateral upper extremities.  No signs of cellulitis or infection today.  Discussed with patient that her wounds will need to continue to heal via secondary intention however appears to be undergoing routine healing at this point.  No signs or symptoms concerning for infection.  She is not able to use topical antibiotic ointment.  Steri-Strips were placed today on all of her skin tears on her upper extremities.  We also gave her Td vaccine today as well.  Anticipate that her skin tears will continue to heal over the next 1 to 2 weeks.  We discussed reasons to return to care and seek emergent care.  Fall Seems to be mechanical in nature after she lost balance on escalator.  Her workup in ED was negative.  Overall reassuring exam here today as well.  We did discuss referral to PT however she declined.  She will let us  know if she changes her mind.    Subjective:  HPI:  See A/P for status of chronic conditions.  Patient here today for ED follow-up.  Months ED a week ago after falling on an escalator.  Also her footing and fell backwards hitting her head on the step.  No loss of consciousness.  In the ED was found to have multiple superficial abrasions.  Her labs were reassuring.  Had imaging which was negative.  She was discharged home.  She was not given a tetanus vaccine.  Her primary concern today is numerous skin tears on both of her arms.  These were not sutured or repaired in the ED.  She was told to put Vaseline on the area.  She has also been putting bandages to the area.  She has allergies to over-the-counter antibiotic ointments and has not been using these either.         Objective:  Physical Exam: BP (!) 156/54   Pulse 68   Temp (!) 97.3 F (36.3 C) (Temporal)   Ht 5\' 1"  (1.549 m)   Wt 143 lb 9.6 oz (65.1 kg)   SpO2 97%    BMI 27.13 kg/m   Gen: No acute distress, resting comfortably Skin: Several scattered skin tears noted on bilateral upper extremities.  No approximately 10 cm linear skin tear on right arm.  Numerous small 1 to 2 cm skin tears on left upper extremity.  Small amount of surrounding erythema.  No drainage.  Nontender to palpation.  Significant ecchymosis noted in left upper extremity. Neuro: Grossly normal, moves all extremities Psych: Normal affect and thought content  Time Spent: 45 minutes of total time was spent on the date of the encounter performing the following actions: chart review prior to seeing the patient including recent ED visit, obtaining history, placing Steri-Strips on her above wounds, performing a medically necessary exam, counseling on the treatment plan, placing orders, and documenting in our EHR.        Jane Cox. Jane Dunker, MD 06/03/2023 2:38 PM

## 2023-06-03 NOTE — Patient Instructions (Addendum)
 It was very nice to see you today!  We placed Steri-Strips on your skin tears.  These will come off on their own in the next few days.  It is okay for you to use soap and water as normal however do not scrub aggressively.  Let us  know if you have any concerning signs for infection.  Let us  know if you change your mind about physical therapy.  Return if symptoms worsen or fail to improve.   Take care, Dr Daneil Dunker  PLEASE NOTE:  If you had any lab tests, please let us  know if you have not heard back within a few days. You may see your results on mychart before we have a chance to review them but we will give you a call once they are reviewed by us .   If we ordered any referrals today, please let us  know if you have not heard from their office within the next week.   If you had any urgent prescriptions sent in today, please check with the pharmacy within an hour of our visit to make sure the prescription was transmitted appropriately.   Please try these tips to maintain a healthy lifestyle:  Eat at least 3 REAL meals and 1-2 snacks per day.  Aim for no more than 5 hours between eating.  If you eat breakfast, please do so within one hour of getting up.   Each meal should contain half fruits/vegetables, one quarter protein, and one quarter carbs (no bigger than a computer mouse)  Cut down on sweet beverages. This includes juice, soda, and sweet tea.   Drink at least 1 glass of water with each meal and aim for at least 8 glasses per day  Exercise at least 150 minutes every week.

## 2023-06-04 DIAGNOSIS — Z1231 Encounter for screening mammogram for malignant neoplasm of breast: Secondary | ICD-10-CM | POA: Diagnosis not present

## 2023-06-04 LAB — HM MAMMOGRAPHY

## 2023-06-06 ENCOUNTER — Encounter: Payer: Self-pay | Admitting: Family Medicine

## 2023-06-12 ENCOUNTER — Encounter: Payer: Self-pay | Admitting: Family Medicine

## 2023-06-12 ENCOUNTER — Telehealth (INDEPENDENT_AMBULATORY_CARE_PROVIDER_SITE_OTHER): Admitting: Family Medicine

## 2023-06-12 DIAGNOSIS — M545 Low back pain, unspecified: Secondary | ICD-10-CM

## 2023-06-12 DIAGNOSIS — I10 Essential (primary) hypertension: Secondary | ICD-10-CM

## 2023-06-12 MED ORDER — PREDNISONE 10 MG PO TABS: 10.0000 mg | ORAL_TABLET | Freq: Every day | ORAL | 0 refills | Status: DC

## 2023-06-12 NOTE — Progress Notes (Signed)
 Jane Cox is a 89 y.o. female who presents today for a virtual office visit.  Assessment/Plan:  New/Acute Problems: Low Back Pain Discussed limitations of virtual visit and inability to perform physical exam.  Based on history sounds like she is likely having muscular spasms.  She did have a fall a couple weeks ago however did not have any pain until 5 days ago.  She is not having any other red flag signs or symptoms however given her recent trauma would be reasonable for us  to check plain film at this point.  Patient stated that she would be able to come in this afternoon or tomorrow to have X-rays performed  She is very hesitant to try any medications due to concern for potential side effects.  She would like to avoid NSAIDs due to concern for lacing her blood pressure readings.  She has not done well with high-dose steroids in the past either.  Will try prednisone  10 mg daily.  We did discuss potential side effects.  She can continue with heating pad and other topical treatments as well.  She is concerned about stability especially in light of her recent fall-will avoid any muscle relaxers or other sedating medications at this point.  Will place referral to physical therapy today as well.  She will follow-up with us  in a few days.  We discussed reasons to return to care and seek emergent care.  Essential hypertension Patient reports blood pressure has been elevated the last few days.  She is currently on amlodipine  5 mg daily, HCTZ 12.5 mg daily, and lisinopril  40 mg daily.  She is very hesitant to try any medications due to concern may be elevating her blood pressure reading as above.  Will try low-dose prednisone .  If blood pressures persistently elevated we could increase dose of her HCTZ or amlodipine .    Subjective:  HPI:  See A/P for status of chronic conditions.  Patient is here today for virtual visit for low back and hip pain.  We saw her in the office 9 days ago after she was in  the ED a week prior to this with fall.  Her primary concern at her office visit here 9 days ago was numerous skin tears on upper extremities.  These were bandaged with Steri-Strips at that time.  Since our last visit she has had continued issues with low back pain and having trouble walking due to the pain. This has significantly worsened the last 5 days.  Pain is predominately located in her lower back.  She is having some pain in her right hip as well.  Pain is persistent in nature.  Pain is made it difficult for her to walk normally.  She has tried using over-the-counter medications including Tylenol  and Advil with only minimal improvement.  Also tried topical gels and ointments without much improvement.  Heating pad has not helped much either.  She has not had any numbness or tingling.  Pain does not radiate.  No reported bowel or bladder incontinence.  No reported urinary retention.       Objective/Observations  Physical Exam: Gen: NAD, resting comfortably Pulm: Normal work of breathing Neuro: Grossly normal, moves all extremities Psych: Normal affect and thought content  Virtual Visit via Video   I connected with Jane Cox on 06/12/23 at  1:00 PM EDT by a video enabled telemedicine application and verified that I am speaking with the correct person using two identifiers. The limitations of evaluation and management  by telemedicine and the availability of in person appointments were discussed. The patient expressed understanding and agreed to proceed.   Patient location: Home Provider location: [place] [employer] Persons participating in the virtual visit: Myself and Patient and her son     [name]. Daneil Dunker, MD 06/12/2023 1:26 PM

## 2023-06-13 ENCOUNTER — Other Ambulatory Visit

## 2023-06-13 ENCOUNTER — Ambulatory Visit

## 2023-06-13 DIAGNOSIS — M5125 Other intervertebral disc displacement, thoracolumbar region: Secondary | ICD-10-CM | POA: Diagnosis not present

## 2023-06-13 DIAGNOSIS — M545 Low back pain, unspecified: Secondary | ICD-10-CM

## 2023-06-13 DIAGNOSIS — M47816 Spondylosis without myelopathy or radiculopathy, lumbar region: Secondary | ICD-10-CM | POA: Diagnosis not present

## 2023-06-13 DIAGNOSIS — M5136 Other intervertebral disc degeneration, lumbar region with discogenic back pain only: Secondary | ICD-10-CM | POA: Diagnosis not present

## 2023-06-13 DIAGNOSIS — M5137 Other intervertebral disc degeneration, lumbosacral region with discogenic back pain only: Secondary | ICD-10-CM | POA: Diagnosis not present

## 2023-06-14 ENCOUNTER — Encounter: Payer: Self-pay | Admitting: Family Medicine

## 2023-06-16 ENCOUNTER — Ambulatory Visit: Payer: Self-pay | Admitting: Family Medicine

## 2023-06-16 ENCOUNTER — Other Ambulatory Visit: Payer: Self-pay | Admitting: *Deleted

## 2023-06-16 DIAGNOSIS — M545 Low back pain, unspecified: Secondary | ICD-10-CM

## 2023-06-16 MED ORDER — PREDNISONE 10 MG PO TABS: 10.0000 mg | ORAL_TABLET | Freq: Every day | ORAL | 0 refills | Status: DC

## 2023-06-16 NOTE — Progress Notes (Signed)
 Her x-ray shows arthritis in her back but no fracture.  She should let us  know if her pain is not improving.

## 2023-06-16 NOTE — Progress Notes (Signed)
 Please see her MyChart message regarding prednisone .  Okay to send in another refill.  Recommend they continue to monitor the blood pressure at home  We can see if we can get her into see sports medicine before her physical therapy appointment.  Please place referral if needed.

## 2023-06-16 NOTE — Telephone Encounter (Signed)
 Rx prednisone  send to pharmacy  Sport medicine referral placed

## 2023-06-16 NOTE — Telephone Encounter (Signed)
 I appreciate the update.  Okay to send in 1 more refill.  Recommend she come in here for in person evaluation if not improving.

## 2023-06-16 NOTE — Telephone Encounter (Signed)
**Note De-identified  [place] Obfuscation** Please advise 

## 2023-06-17 NOTE — Telephone Encounter (Signed)
 Spoke with patient, notified referral was placed  Patient may contact [facility] to schedule an appt

## 2023-06-18 ENCOUNTER — Ambulatory Visit (INDEPENDENT_AMBULATORY_CARE_PROVIDER_SITE_OTHER): Admitting: Sports Medicine

## 2023-06-18 VITALS — HR 75 | Ht 61.0 in | Wt 143.0 lb

## 2023-06-18 DIAGNOSIS — M5136 Other intervertebral disc degeneration, lumbar region with discogenic back pain only: Secondary | ICD-10-CM | POA: Diagnosis not present

## 2023-06-18 DIAGNOSIS — G8929 Other chronic pain: Secondary | ICD-10-CM

## 2023-06-18 MED ORDER — CELECOXIB 200 MG PO CAPS: 200.0000 mg | ORAL_CAPSULE | Freq: Two times a day (BID) | ORAL | 0 refills | Status: DC

## 2023-06-18 NOTE — Progress Notes (Signed)
 Jane Cox D.[facility] [address] [place] [address] Phone: [phone]   Assessment and Plan:     1. Chronic bilateral low back pain without sciatica 2. Degeneration of intervertebral disc of lumbar region with discogenic back pain -Chronic with exacerbation, initial visit - Patient presenting with 2 weeks of bilateral low back pain without radicular symptoms.  Most consistent with flare of degenerative changes, DDD, facet arthrosis and lumbar spine as seen on recent lumbar x-ray - Reviewed patient's lumbar x-ray with patient today and showed lumbar scoliosis, degenerative changes throughout lumbar spine - Patient had received mild temporary relief with prednisone .  Recommend discontinuing prednisone  at this time - Start Celebrex 200 mg twice daily for 2 weeks.  Patient does have concern that this could elevate her blood pressure.  We educated that NSAIDs may temporarily and mildly increased blood pressure, but pain can temporarily increase blood pressure as well, so the effect will likely be offset, and we do not plan on using NSAIDs long-term. -Start HEP for low back - Recommend using walker during recovery to prevent falls - Patient is scheduled to start physical therapy in 2 weeks.  I agree that this can be helpful  15 additional minutes spent for educating Therapeutic Home Exercise Program.  This included exercises focusing on stretching, strengthening, with focus on eccentric aspects.   Long term goals include an improvement in range of motion, strength, endurance as well as avoiding reinjury. Patient's frequency would include in 1-2 times a day, 3-5 times a week for a duration of 6-12 weeks. Proper technique shown and discussed handout in great detail with ATC.  All questions were discussed and answered.    Patient accompanied by her son throughout entirety of office visit.  Pertinent previous records reviewed include family medicine  office visit 06/03/2023, family medicine video visit 06/12/2023, lumbar x-ray 06/13/2023  Follow Up: 3 weeks for reevaluation.  If no improvement or worsening of symptoms, could consider advanced imaging versus transitioning to safer chronic pain medications   Subjective:   I, Jane Cox, am serving as a Neurosurgeon for Doctor Ulysees Gander  Chief Complaint: low back pain   HPI:   06/18/23 Patient is a 89 year old female with low back pain. Patient states was seen by PCP 06/12/2023 Discussed limitations of virtual visit and inability to perform physical exam.  Based on history sounds like she is likely having muscular spasms.  She did have a fall a couple weeks ago however did not have any pain until 5 days ago.  She is not having any other red flag signs or symptoms however given her recent trauma would be reasonable for us  to check plain film at this point.  Patient stated that she would be able to come in this afternoon or tomorrow to have X-rays performed  She is very hesitant to try any medications due to concern for potential side effects.  She would like to avoid NSAIDs due to concern for lacing her blood pressure readings.  She has not done well with high-dose steroids in the past either.  Will try prednisone  10 mg daily.  We did discuss potential side effects.  She can continue with heating pad and other topical treatments as well.  She is concerned about stability especially in light of her recent fall-will avoid any muscle relaxers or other sedating medications at this point.  Will place referral to physical therapy today as well.  She will follow-up with us   in a few days.  We discussed reasons to return to care and seek emergent care.   [name] 05/28/2023 she fell off of an escalator the bottom of it . Was seen in the ED head and CT normal. She is in wheelchair now. May 10 th is when low back started hurting. Prednisone  helped at first but doesn't last long   Relevant Historical Information:  Hypertension, RAS  Additional pertinent review of systems negative.   Current Outpatient Medications:    acetaminophen  (TYLENOL ) 650 MG CR tablet, Take 650 mg by mouth every 8 (eight) hours as needed. Reported on 02/27/2015, Disp: , Rfl:    amLODipine  (NORVASC ) 5 MG tablet, Take 1 tablet (5 mg total) by mouth daily., Disp: 90 tablet, Rfl: 3   atorvastatin  (LIPITOR) 20 MG tablet, TAKE 1/2 TABLET BY MOUTH DAILY, Disp: 45 tablet, Rfl: 1   Ayr Saline Nasal No-Drip GEL, Place into the nose., Disp: , Rfl:    celecoxib (CELEBREX) 200 MG capsule, Take 1 capsule (200 mg total) by mouth 2 (two) times daily., Disp: 28 capsule, Rfl: 0   hydrochlorothiazide  (MICROZIDE ) 12.5 MG capsule, TAKE 1 CAPSULE BY MOUTH EVERY DAY, Disp: 90 capsule, Rfl: 1   labetalol  (NORMODYNE ) 100 MG tablet, TAKE 1 TABLET BY MOUTH TWICE A DAY, Disp: 180 tablet, Rfl: 3   lisinopril  (ZESTRIL ) 40 MG tablet, TAKE 1 TABLET BY MOUTH EVERY DAY, Disp: 90 tablet, Rfl: 1   predniSONE  (DELTASONE ) 10 MG tablet, Take 1 tablet (10 mg total) by mouth daily with breakfast., Disp: 5 tablet, Rfl: 0   Objective:     Vitals:   06/18/23 1523  Pulse: 75  SpO2: 97%  Weight: 143 lb (64.9 kg)  Height: 5\' 1"  (1.549 m)      Body mass index is 27.02 kg/m.    Physical Exam:    Gen: Appears well, nad, nontoxic and pleasant Psych: Alert and oriented, appropriate mood and affect Neuro: sensation intact, strength is 5/5 in upper and lower extremities, muscle tone wnl Skin: no susupicious lesions or rashes  Back - Normal skin, Spine with normal alignment and no deformity.   tenderness to lumbar vertebral process palpation.   Bilateral lumbar paraspinous muscles are tender and without spasm NTTP gluteal musculature Straight leg raise negative Gait abnormal.  Using wheelchair at today's office visit   Electronically signed by:  Marshall Skeeter D.[facility] 4:30 PM 06/18/23

## 2023-06-18 NOTE — Patient Instructions (Signed)
 Celebrex 200 mg 2x daily for 2 weeks  Low back HEP  Use a walker for support  3 week follow up

## 2023-06-19 ENCOUNTER — Other Ambulatory Visit: Payer: Self-pay | Admitting: Sports Medicine

## 2023-06-19 ENCOUNTER — Encounter: Payer: Self-pay | Admitting: Sports Medicine

## 2023-06-19 MED ORDER — NAPROXEN 500 MG PO TABS: 500.0000 mg | ORAL_TABLET | Freq: Two times a day (BID) | ORAL | 0 refills | Status: DC

## 2023-06-19 NOTE — Progress Notes (Unsigned)
 Naproxen sent in

## 2023-06-20 ENCOUNTER — Telehealth: Payer: Self-pay | Admitting: Sports Medicine

## 2023-06-20 NOTE — Telephone Encounter (Signed)
 Message sent via [employer]

## 2023-06-20 NOTE — Telephone Encounter (Signed)
 Patient called and stated she took one tablet of Celebrex the Wednesday evening that she had her appointment last week and the next day on Thursday morning she took it and shortly after had very bad diarrhea. She says she has not taken anything since except tylenol . She wants to know if there is a different medicine she can do. She is feeling pretty miserable. Please advise.

## 2023-07-02 ENCOUNTER — Other Ambulatory Visit: Payer: Self-pay

## 2023-07-02 ENCOUNTER — Ambulatory Visit: Attending: Family Medicine

## 2023-07-02 ENCOUNTER — Encounter: Payer: Self-pay | Admitting: Sports Medicine

## 2023-07-02 DIAGNOSIS — R293 Abnormal posture: Secondary | ICD-10-CM | POA: Insufficient documentation

## 2023-07-02 DIAGNOSIS — R278 Other lack of coordination: Secondary | ICD-10-CM | POA: Insufficient documentation

## 2023-07-02 DIAGNOSIS — M6281 Muscle weakness (generalized): Secondary | ICD-10-CM | POA: Insufficient documentation

## 2023-07-02 DIAGNOSIS — M545 Low back pain, unspecified: Secondary | ICD-10-CM | POA: Insufficient documentation

## 2023-07-02 DIAGNOSIS — M25512 Pain in left shoulder: Secondary | ICD-10-CM | POA: Diagnosis not present

## 2023-07-02 DIAGNOSIS — Z9181 History of falling: Secondary | ICD-10-CM | POA: Insufficient documentation

## 2023-07-02 DIAGNOSIS — M5459 Other low back pain: Secondary | ICD-10-CM | POA: Insufficient documentation

## 2023-07-02 DIAGNOSIS — G8929 Other chronic pain: Secondary | ICD-10-CM | POA: Diagnosis not present

## 2023-07-02 NOTE — Therapy (Signed)
 OUTPATIENT PHYSICAL THERAPY THORACOLUMBAR EVALUATION   Patient Name: Jane Cox MRN: [id number] DOB:1933-04-08, 89 y.o., female Today's Date: 07/02/2023  END OF SESSION:  PT End of Session - 07/02/23 1518     Visit Number 1    Date for PT Re-Evaluation 09/24/23    PT Start Time 1517    PT Stop Time 1600    PT Time Calculation (min) 43 min    Activity Tolerance Patient tolerated treatment well    Behavior During Therapy [facility] for tasks assessed/performed             Past Medical History:  Diagnosis Date   Allergy    Basal cell carcinoma    HX: breast cancer    Hyperlipidemia    Hypertension    Osteopenia    Past Surgical History:  Procedure Laterality Date   ABDOMINAL HYSTERECTOMY  1983   BREAST SURGERY     mastectomy   CHOLECYSTECTOMY  1993   COLONOSCOPY     VEIN SURGERY     Patient Active Problem List   Diagnosis Date Noted   Chronic rhinitis 03/27/2023   Other specified disorders of eustachian tube, bilateral 03/27/2023   Vitamin D  deficiency 03/05/2022   Ruptured Bakers cyst 01/13/2018   Skin cancer, basal cell 04/20/2015   Lumbar degenerative disc disease 05/23/2014   Arthritis of left hip 05/23/2014   Greater trochanteric bursitis of right hip 05/23/2014   Renal artery stenosis (HCC) 12/15/2013   Hyperlipidemia 09/10/2006   Essential hypertension 08/18/2006   Allergic rhinitis 08/18/2006   Osteopenia 08/18/2006   BREAST CANCER, HX OF 08/18/2006    PCP: Clarisa Crooked, MD  REFERRING PROVIDER: Valdene Garret, MD  REFERRING DIAG: LS spine pain  Rationale for Evaluation and Treatment: Rehabilitation  THERAPY DIAG:  Muscle weakness (generalized)  Other low back pain  ONSET DATE: 05/28/23  SUBJECTIVE:                                                                                                                                                                                           SUBJECTIVE STATEMENT: Fell on the 3rd step of escalator,  then developed lower back pain about 8 days later.  I have tried taking the medicine that they prescribed and it has helped although it upsets my stomach.  I am in pain anytime I am still, especially at night, I just cannot get comfortable  PERTINENT HISTORY:  Spent August until November of 2024 caring for her husband who was in hospice.  Normally doesn't have lower back pain, had bad fall at bottom of escalator and her clothes were caught in the  belt and lacerations throughout Ue's. Referred to PT from sports medicine MD.   PAIN:  Are you having pain? Yes: NPRS scale: 2 to 7 Pain location: L lat hip, central lower back Pain description: feels like I freeze or lock up after I am still for a while Aggravating factors: lying down most painful Relieving factors: meds somewhat  PRECAUTIONS: Fall  RED FLAGS: None   WEIGHT BEARING RESTRICTIONS: No  FALLS:  Has patient fallen in last 6 months? Yes. Number of falls 1  LIVING ENVIRONMENT: Lives with: lives with their son Lives in: House/apartment Stairs: no Has following equipment at home: Environmental consultant - 2 wheeled  OCCUPATION: retired  PLOF: Independent  PATIENT GOALS: to manage this pain  NEXT MD VISIT: one month  OBJECTIVE:  Note: Objective measures were completed at Evaluation unless otherwise noted.  DIAGNOSTIC FINDINGS:  Arthritic changes primarily L5/S1, see imaging report in EPIC  PATIENT SURVEYS:  Modified Oswestry Modified Oswestry Low Back Pain Disability Questionnaire: 29 / 50 = 58.0 %   COGNITION: Overall cognitive status: Within functional limits for tasks assessed     SENSATION: WFL  MUSCLE LENGTH: Hamstrings: Right wfl deg; Left wfl deg Andy Bannister test: Right wfl deg; Left wfl deg  POSTURE: some loss of lumbar lordosis, wider than normal base of support, flexed thoracic mild  PALPATION: Non tender with palpation B post hips, lumbar region  LUMBAR ROM:   AROM eval  Flexion 25p!  Extension 20p!  Right  lateral flexion 25p!  Left lateral flexion 25  Right rotation   Left rotation    (Blank rows = not tested)  LOWER EXTREMITY ROM:     Passive  Right eval Left eval  Hip flexion  110 p!  Hip extension    Hip abduction    Hip adduction    Hip internal rotation    Hip external rotation  20p!  Knee flexion    Knee extension    Ankle dorsiflexion heel walk wfl wfl  Ankle plantarflexion toe walk wfl wfl  Ankle inversion    Ankle eversion     (Blank rows = wfl)  LOWER EXTREMITY MMT:  all MMT grossly wnl and non provocative LE's  MMT Right eval Left eval  Hip flexion    Hip extension    Hip abduction    Hip adduction    Hip internal rotation    Hip external rotation    Knee flexion    Knee extension    Ankle dorsiflexion    Ankle plantarflexion    Ankle inversion    Ankle eversion     (Blank rows = not tested)  LUMBAR SPECIAL TESTS:  Straight leg raise test: Negative  FUNCTIONAL TESTS:  5 times sit to stand: 34s, pain L hip  GAIT: Distance walked: in clinic no device, 90' no significant deficits noted TREATMENT DATE: 07/02/23:  Evaluation,  Utilized sheet around pelvis, in hook lying position for 3 bouts of static gentle pelvic traction by PT with some relief of pain  Instructed in therex as indicated below, also place moist head under back while performing the ex to improve jt stiffness Needed tactle cues for pelvic tilt and for overpressure with arms for alt knee to chest,  PATIENT EDUCATION:  Education details: POC, goals  Person educated: Patient Education method: Explanation, Demonstration, Tactile cues, Verbal cues, and Handouts Education comprehension: verbalized understanding  HOME EXERCISE PROGRAM: Access Code: [email or web address] URL: [email or web address] Date: 07/02/2023 Prepared by: Rye Dorado  Exercises - Supine  Lower Trunk Rotation  - 1 x daily - 7 x weekly - 3 sets - 10 reps - Supine Piriformis Stretch with Foot on Ground  - 1 x daily - 7 x weekly - 3 sets - 10 reps - Supine Posterior Pelvic Tilt  - 1 x daily - 7 x weekly - 3 sets - 10 reps  ASSESSMENT:  CLINICAL IMPRESSION: Patient is a 89 y.o. female who was evaluated today by physical therapy for recent onset of central lower back pain.  Her pain is consistent with joint stiffness. She has good LE strength, no neurologic Sx.  Very stiff throughout lumbar region.  Tolerated initial session of gentle stretching well.  She should benefit from skilled PT to address her pain and improve her sleeping patterns, movement efficiency..   OBJECTIVE IMPAIRMENTS: decreased activity tolerance, decreased balance, decreased knowledge of condition, hypomobility, impaired perceived functional ability, and pain.   ACTIVITY LIMITATIONS: carrying, lifting, bending, sitting, squatting, sleeping, transfers, and bed mobility  PARTICIPATION LIMITATIONS: meal prep, cleaning, laundry, driving, shopping, and community activity  PERSONAL FACTORS: Age, Behavior pattern, Past/current experiences, Time since onset of injury/illness/exacerbation, and 1-2 comorbidities: HTN, h/o fall, osteopenia are also affecting patient's functional outcome.   REHAB POTENTIAL: Good  CLINICAL DECISION MAKING: Evolving/moderate complexity  EVALUATION COMPLEXITY: Moderate   GOALS: Goals reviewed with patient? Yes  SHORT TERM GOALS: Target date: 2 weeks, 07/26/23  I HEP Baseline: Goal status: INITIAL  LONG TERM GOALS: Target date: 12 weeks, 09/24/23  Modified Oswestry 58% improve to 38% Baseline:  Goal status: INITIAL  2.  Improve sleeping pattern, pt able to reduce intensity of pain when awakening by 50%  Baseline: 7 to8/10 pain  Goal status: INITIAL  3.  5 xsit to stand 14.8 sec or less Baseline:  Goal status: INITIAL  4.  Improve lumbar spine Rom all planes to 50 % or  greater,  Baseline: 25% Goal status: INITIAL   PLAN:  PT FREQUENCY: 2x/week  PT DURATION: 12 weeks  PLANNED INTERVENTIONS: 97110-Therapeutic exercises, 97530- Therapeutic activity, [id number]- Neuromuscular re-education, 97535- Self Care, and 30865- Manual therapy.  PLAN FOR NEXT SESSION: would trial modalities such as moist heat, possibly e stim, continue with manual pelvic traction, progress with therex to gently improve spinal mobilty   Penelopi Mikrut L Lisa Milian, PT, DPT, OCS 07/02/2023, 3:20 PM

## 2023-07-03 ENCOUNTER — Other Ambulatory Visit: Payer: Self-pay | Admitting: Sports Medicine

## 2023-07-03 ENCOUNTER — Ambulatory Visit

## 2023-07-03 DIAGNOSIS — G8929 Other chronic pain: Secondary | ICD-10-CM | POA: Diagnosis not present

## 2023-07-03 DIAGNOSIS — M5459 Other low back pain: Secondary | ICD-10-CM

## 2023-07-03 DIAGNOSIS — R293 Abnormal posture: Secondary | ICD-10-CM

## 2023-07-03 DIAGNOSIS — M25512 Pain in left shoulder: Secondary | ICD-10-CM | POA: Diagnosis not present

## 2023-07-03 DIAGNOSIS — M6281 Muscle weakness (generalized): Secondary | ICD-10-CM | POA: Diagnosis not present

## 2023-07-03 DIAGNOSIS — R278 Other lack of coordination: Secondary | ICD-10-CM | POA: Diagnosis not present

## 2023-07-03 MED ORDER — NAPROXEN 500 MG PO TABS: 500.0000 mg | ORAL_TABLET | Freq: Two times a day (BID) | ORAL | 0 refills | Status: DC

## 2023-07-03 NOTE — Progress Notes (Signed)
 Refill placed

## 2023-07-03 NOTE — Therapy (Signed)
 OUTPATIENT PHYSICAL THERAPY THORACOLUMBAR TREATMENT   Patient Name: Jane Cox MRN: [id number] DOB:1933/04/26, 89 y.o., female Today's Date: 07/03/2023  END OF SESSION:  PT End of Session - 07/03/23 1609     Visit Number 2    Date for PT Re-Evaluation 09/24/23    PT Start Time 1610    PT Stop Time 1655    PT Time Calculation (min) 45 min    Activity Tolerance Patient tolerated treatment well    Behavior During Therapy [facility] for tasks assessed/performed              Past Medical History:  Diagnosis Date   Allergy    Basal cell carcinoma    HX: breast cancer    Hyperlipidemia    Hypertension    Osteopenia    Past Surgical History:  Procedure Laterality Date   ABDOMINAL HYSTERECTOMY  1983   BREAST SURGERY     mastectomy   CHOLECYSTECTOMY  1993   COLONOSCOPY     VEIN SURGERY     Patient Active Problem List   Diagnosis Date Noted   Chronic rhinitis 03/27/2023   Other specified disorders of eustachian tube, bilateral 03/27/2023   Vitamin D  deficiency 03/05/2022   Ruptured Bakers cyst 01/13/2018   Skin cancer, basal cell 04/20/2015   Lumbar degenerative disc disease 05/23/2014   Arthritis of left hip 05/23/2014   Greater trochanteric bursitis of right hip 05/23/2014   Renal artery stenosis (HCC) 12/15/2013   Hyperlipidemia 09/10/2006   Essential hypertension 08/18/2006   Allergic rhinitis 08/18/2006   Osteopenia 08/18/2006   BREAST CANCER, HX OF 08/18/2006    PCP: Clarisa Crooked, MD  REFERRING PROVIDER: Valdene Garret, MD  REFERRING DIAG: LS spine pain  Rationale for Evaluation and Treatment: Rehabilitation  THERAPY DIAG:  Muscle weakness (generalized)  Other low back pain  Chronic left shoulder pain  Abnormal posture  Other lack of coordination  ONSET DATE: 05/28/23  SUBJECTIVE:                                                                                                                                                                                            SUBJECTIVE STATEMENT: My back is hurting, it is improving but it is still bad.    EVAL--Fell on the 3rd step of escalator, then developed lower back pain about 8 days later.  I have tried taking the medicine that they prescribed and it has helped although it upsets my stomach.  I am in pain anytime I am still, especially at night, I just cannot get comfortable  PERTINENT HISTORY:  Spent August until November of 2024  caring for her husband who was in hospice.  Normally doesn't have lower back pain, had bad fall at bottom of escalator and her clothes were caught in the belt and lacerations throughout Ue's. Referred to PT from sports medicine MD.   PAIN:  Are you having pain? Yes: NPRS scale: 2 to 7 Pain location: L lat hip, central lower back Pain description: feels like I freeze or lock up after I am still for a while Aggravating factors: lying down most painful Relieving factors: meds somewhat  PRECAUTIONS: Fall  RED FLAGS: None   WEIGHT BEARING RESTRICTIONS: No  FALLS:  Has patient fallen in last 6 months? Yes. Number of falls 1  LIVING ENVIRONMENT: Lives with: lives with their son Lives in: House/apartment Stairs: no Has following equipment at home: Environmental consultant - 2 wheeled  OCCUPATION: retired  PLOF: Independent  PATIENT GOALS: to manage this pain  NEXT MD VISIT: one month  OBJECTIVE:  Note: Objective measures were completed at Evaluation unless otherwise noted.  DIAGNOSTIC FINDINGS:  Arthritic changes primarily L5/S1, see imaging report in EPIC  PATIENT SURVEYS:  Modified Oswestry Modified Oswestry Low Back Pain Disability Questionnaire: 29 / 50 = 58.0 %   COGNITION: Overall cognitive status: Within functional limits for tasks assessed     SENSATION: WFL  MUSCLE LENGTH: Hamstrings: Right wfl deg; Left wfl deg Andy Bannister test: Right wfl deg; Left wfl deg  POSTURE: some loss of lumbar lordosis, wider than normal base of support, flexed thoracic  mild  PALPATION: Non tender with palpation B post hips, lumbar region  LUMBAR ROM:   AROM eval  Flexion 25p!  Extension 20p!  Right lateral flexion 25p!  Left lateral flexion 25  Right rotation   Left rotation    (Blank rows = not tested)  LOWER EXTREMITY ROM:     Passive  Right eval Left eval  Hip flexion  110 p!  Hip extension    Hip abduction    Hip adduction    Hip internal rotation    Hip external rotation  20p!  Knee flexion    Knee extension    Ankle dorsiflexion heel walk wfl wfl  Ankle plantarflexion toe walk wfl wfl  Ankle inversion    Ankle eversion     (Blank rows = wfl)  LOWER EXTREMITY MMT:  all MMT grossly wnl and non provocative LE's  MMT Right eval Left eval  Hip flexion    Hip extension    Hip abduction    Hip adduction    Hip internal rotation    Hip external rotation    Knee flexion    Knee extension    Ankle dorsiflexion    Ankle plantarflexion    Ankle inversion    Ankle eversion     (Blank rows = not tested)  LUMBAR SPECIAL TESTS:  Straight leg raise test: Negative  FUNCTIONAL TESTS:  5 times sit to stand: 34s, pain L hip  GAIT: Distance walked: in clinic no device, 90' no significant deficits noted  TREATMENT DATE:  07/03/23 NuStep L4 x56mins Supine LE stretches w/moist heat- SKTC, HS, LTR, glutes, piriformis  Feet on pball rotations and knees to chest x10  PPT x10 Supine marching 20 reps alt  Ball squeeze 2x10  STS 2x10  Alt box tap 4"   07/02/23:  Evaluation,  Utilized sheet around pelvis, in hook lying position for 3 bouts of static gentle pelvic traction by PT with some relief of pain  Instructed in therex as indicated  below, also place moist head under back while performing the ex to improve jt stiffness Needed tactle cues for pelvic tilt and for overpressure with arms for alt knee to chest,                                                                                                                                   PATIENT EDUCATION:  Education details: POC, goals  Person educated: Patient Education method: Explanation, Demonstration, Tactile cues, Verbal cues, and Handouts Education comprehension: verbalized understanding  HOME EXERCISE PROGRAM: Access Code: [email or web address] URL: [email or web address] Date: 07/02/2023 Prepared by: Amy Speaks  Exercises - Supine Lower Trunk Rotation  - 1 x daily - 7 x weekly - 3 sets - 10 reps - Supine Piriformis Stretch with Foot on Ground  - 1 x daily - 7 x weekly - 3 sets - 10 reps - Supine Posterior Pelvic Tilt  - 1 x daily - 7 x weekly - 3 sets - 10 reps  ASSESSMENT:  CLINICAL IMPRESSION: Patient returns with ongoing pain in her back. She reports compliance with HEP thus far. We worked on some light mobility and strengthening today. She is very tight with figure 4 and glute stretching, more on the R > L. She tolerates all of it well but was hesitant with box taps on 4". She is fearful that she will fall. She used 1 hand assist but then was able to do with close SBA on her own.    EVAL-Patient is a 89 y.o. female who was evaluated today by physical therapy for recent onset of central lower back pain.  Her pain is consistent with joint stiffness. She has good LE strength, no neurologic Sx.  Very stiff throughout lumbar region.  Tolerated initial session of gentle stretching well.  She should benefit from skilled PT to address her pain and improve her sleeping patterns, movement efficiency.  OBJECTIVE IMPAIRMENTS: decreased activity tolerance, decreased balance, decreased knowledge of condition, hypomobility, impaired perceived functional ability, and pain.   ACTIVITY LIMITATIONS: carrying, lifting, bending, sitting, squatting, sleeping, transfers, and bed mobility  PARTICIPATION LIMITATIONS: meal prep, cleaning, laundry, driving, shopping, and community activity  PERSONAL FACTORS: Age, Behavior pattern, Past/current experiences, Time since onset  of injury/illness/exacerbation, and 1-2 comorbidities: HTN, h/o fall, osteopenia are also affecting patient's functional outcome.   REHAB POTENTIAL: Good  CLINICAL DECISION MAKING: Evolving/moderate complexity  EVALUATION COMPLEXITY: Moderate   GOALS: Goals reviewed with patient? Yes  SHORT TERM GOALS: Target date: 2 weeks, 07/26/23  I HEP Baseline: Goal status: INITIAL  LONG TERM GOALS: Target date: 12 weeks, 09/24/23  Modified Oswestry 58% improve to 38% Baseline:  Goal status: INITIAL  2.  Improve sleeping pattern, pt able to reduce intensity of pain when awakening by 50%  Baseline: 7 to8/10 pain  Goal status: INITIAL  3.  5 xsit to stand 14.8 sec or less Baseline:  Goal status: INITIAL  4.  Improve lumbar  spine Rom all planes to 50 % or greater,  Baseline: 25% Goal status: INITIAL   PLAN:  PT FREQUENCY: 2x/week  PT DURATION: 12 weeks  PLANNED INTERVENTIONS: 97110-Therapeutic exercises, 97530- Therapeutic activity, [id number]- Neuromuscular re-education, 97535- Self Care, and 78469- Manual therapy.  PLAN FOR NEXT SESSION: would trial modalities such as moist heat, possibly e stim, continue with manual pelvic traction, progress with therex to gently improve spinal mobilty   Donavon Fudge, PT, DPT, OCS 07/03/2023, 4:58 PM

## 2023-07-07 NOTE — Progress Notes (Unsigned)
 [facility] [address] [place] [address] Phone: [phone]   Assessment and Plan:     There are no diagnoses linked to this encounter.  ***   Pertinent previous records reviewed include ***    Follow Up: ***     Subjective:   I, Jane Cox, am serving as a Neurosurgeon for Jane Cox   Chief Complaint: low back pain    HPI:    06/18/23 Patient is a 89 year old female with low back pain. Patient states was seen by PCP 06/12/2023 Discussed limitations of virtual visit and inability to perform physical exam.  Based on history sounds like she is likely having muscular spasms.  She did have a fall a couple weeks ago however did not have any pain until 5 days ago.  She is not having any other red flag signs or symptoms however given her recent trauma would be reasonable for us  to check plain film at this point.  Patient stated that she would be able to come in this afternoon or tomorrow to have X-rays performed  She is very hesitant to try any medications due to concern for potential side effects.  She would like to avoid NSAIDs due to concern for lacing her blood pressure readings.  She has not done well with high-dose steroids in the past either.  Will try prednisone  10 mg daily.  We did discuss potential side effects.  She can continue with heating pad and other topical treatments as well.  She is concerned about stability especially in light of her recent fall-will avoid any muscle relaxers or other sedating medications at this point.  Will place referral to physical therapy today as well.  She will follow-up with us  in a few days.  We discussed reasons to return to care and seek emergent care.    Jane Cox 05/28/2023 she fell off of an escalator the bottom of it . Was seen in the ED head and CT normal. She is in wheelchair now. May 10 th is when low back started hurting. Prednisone  helped at first but doesn't last long    07/08/2023 Patient states   Relevant Historical Information: Hypertension, RAS Additional pertinent review of systems negative.   Current Outpatient Medications:    acetaminophen  (TYLENOL ) 650 MG CR tablet, Take 650 mg by mouth every 8 (eight) hours as needed. Reported on 02/27/2015, Disp: , Rfl:    amLODipine  (NORVASC ) 5 MG tablet, Take 1 tablet (5 mg total) by mouth daily., Disp: 90 tablet, Rfl: 3   atorvastatin  (LIPITOR) 20 MG tablet, TAKE 1/2 TABLET BY MOUTH DAILY, Disp: 45 tablet, Rfl: 1   Ayr Saline Nasal No-Drip GEL, Place into the nose., Disp: , Rfl:    celecoxib  (CELEBREX ) 200 MG capsule, Take 1 capsule (200 mg total) by mouth 2 (two) times daily., Disp: 28 capsule, Rfl: 0   hydrochlorothiazide  (MICROZIDE ) 12.5 MG capsule, TAKE 1 CAPSULE BY MOUTH EVERY DAY, Disp: 90 capsule, Rfl: 1   labetalol  (NORMODYNE ) 100 MG tablet, TAKE 1 TABLET BY MOUTH TWICE A DAY, Disp: 180 tablet, Rfl: 3   lisinopril  (ZESTRIL ) 40 MG tablet, TAKE 1 TABLET BY MOUTH EVERY DAY, Disp: 90 tablet, Rfl: 1   naproxen  (NAPROSYN ) 500 MG tablet, Take 1 tablet (500 mg total) by mouth 2 (two) times daily., Disp: 60 tablet, Rfl: 0   predniSONE  (DELTASONE ) 10 MG tablet, Take 1 tablet (10 mg total) by mouth daily with breakfast., Disp: 5  tablet, Rfl: 0   Objective:     There were no vitals filed for this visit.    There is no height or weight on file to calculate BMI.    Physical Exam:    ***   Electronically signed by:  [facility] 7:43 AM 07/07/23

## 2023-07-08 ENCOUNTER — Encounter: Payer: Self-pay | Admitting: Sports Medicine

## 2023-07-08 ENCOUNTER — Ambulatory Visit (INDEPENDENT_AMBULATORY_CARE_PROVIDER_SITE_OTHER): Admitting: Sports Medicine

## 2023-07-08 VITALS — HR 72 | Ht 61.0 in | Wt 143.0 lb

## 2023-07-08 DIAGNOSIS — R2689 Other abnormalities of gait and mobility: Secondary | ICD-10-CM | POA: Diagnosis not present

## 2023-07-08 DIAGNOSIS — G8929 Other chronic pain: Secondary | ICD-10-CM

## 2023-07-08 DIAGNOSIS — M5136 Other intervertebral disc degeneration, lumbar region with discogenic back pain only: Secondary | ICD-10-CM

## 2023-07-08 NOTE — Patient Instructions (Signed)
 Thank you for coming in today  Stop taking naproxen   Continue Tylenol  500 mg 3 times daily as needed for pain relief  Continue physical therapy and we have sent in an additional referral for balance therapy  Continue home exercises  If you still have pain after completing physical therapy in July , I recommend contacting our office.  You can tell them that you still have back pain and that Dr. Cleora Daft asked you to call and ask for a lumbar MRI.  We would then order the MRI and follow-up with you in clinic 1 week after your MRI is performed.

## 2023-07-10 ENCOUNTER — Encounter: Payer: Self-pay | Admitting: Family Medicine

## 2023-07-10 ENCOUNTER — Ambulatory Visit (INDEPENDENT_AMBULATORY_CARE_PROVIDER_SITE_OTHER): Admitting: Family Medicine

## 2023-07-10 VITALS — BP 148/62 | HR 77 | Temp 97.3°F | Ht 61.0 in | Wt 137.5 lb

## 2023-07-10 DIAGNOSIS — I10 Essential (primary) hypertension: Secondary | ICD-10-CM

## 2023-07-10 DIAGNOSIS — M5136 Other intervertebral disc degeneration, lumbar region with discogenic back pain only: Secondary | ICD-10-CM | POA: Diagnosis not present

## 2023-07-10 NOTE — Patient Instructions (Addendum)
 Lets try align probiotic  Hopefully blood pressure comes down as back gets better and eventually off tylenol . Hope back pain will continue to improve  Tolerate blood pressure as long as <150/90 on average  Keep going with physical therapy and hopefully can help with balance as well  Recommended follow up: Return for next already scheduled visit or sooner if needed.

## 2023-07-10 NOTE — Progress Notes (Signed)
 Phone [phone] In person visit   Subjective:   Jane Cox is a 89 y.o. year old very pleasant female patient who presents for/with See problem oriented charting Chief Complaint  Patient presents with   Back Pain    Pt c/o lower back pain that comes and goes that started after fall on 04/30 and back pain started on 05/10.   Hypertension    Pt states bp has been all over the place since her fall and being on Naprosyn . Pt stoppe Naorosyn 5 days ago.    Past Medical History-  Patient Active Problem List   Diagnosis Date Noted   Vitamin D  deficiency 03/05/2022    Priority: Medium    Renal artery stenosis (HCC) 12/15/2013    Priority: Medium    Hyperlipidemia 09/10/2006    Priority: Medium    Essential hypertension 08/18/2006    Priority: Medium    BREAST CANCER, HX OF 08/18/2006    Priority: Medium    Ruptured Bakers cyst 01/13/2018    Priority: Low   Skin cancer, basal cell 04/20/2015    Priority: Low   Lumbar degenerative disc disease 05/23/2014    Priority: Low   Arthritis of left hip 05/23/2014    Priority: Low   Greater trochanteric bursitis of right hip 05/23/2014    Priority: Low   Allergic rhinitis 08/18/2006    Priority: Low   Osteopenia 08/18/2006    Priority: Low   Chronic rhinitis 03/27/2023   Other specified disorders of eustachian tube, bilateral 03/27/2023    Medications- reviewed and updated Current Outpatient Medications  Medication Sig Dispense Refill   acetaminophen  (TYLENOL ) 650 MG CR tablet Take 650 mg by mouth every 8 (eight) hours as needed. Reported on 02/27/2015     amLODipine  (NORVASC ) 5 MG tablet Take 1 tablet (5 mg total) by mouth daily. 90 tablet 3   atorvastatin  (LIPITOR) 20 MG tablet TAKE 1/2 TABLET BY MOUTH DAILY 45 tablet 1   hydrochlorothiazide  (MICROZIDE ) 12.5 MG capsule TAKE 1 CAPSULE BY MOUTH EVERY DAY 90 capsule 1   labetalol  (NORMODYNE ) 100 MG tablet TAKE 1 TABLET BY MOUTH TWICE A DAY 180 tablet 3   lisinopril  (ZESTRIL )  40 MG tablet TAKE 1 TABLET BY MOUTH EVERY DAY 90 tablet 1   No current facility-administered medications for this visit.     Objective:  BP (!) 148/62   Pulse 77   Temp (!) 97.3 F (36.3 C)   Ht 5' 1 (1.549 m)   Wt 137 lb 8 oz (62.4 kg)   SpO2 98%   BMI 25.98 kg/m  Gen: NAD, resting comfortably CV: RRR no murmurs rubs or gallops Lungs: CTAB no crackles, wheeze, rhonchi Some left low back pain with deep palpation Ext: Minimal edema Skin: warm, dry    Assessment and Plan   #History of breast cancer-in 1989 risk by gynecologist.  Mastectomy without any adjuvant treatment.  Still gets yearly mammograms - pending ultrasound with incomplete mammogram result but needs to heal from the back as does not think she can get in position well right now but improving   # Chronic low back pain with degenerative disc disease more recent flare S: Patient initially saw Dr. Cleora Daft on 06/18/2023 with 2 weeks of back pain flare-was placed on a course of naproxen  and referred to physical therapy-prior to that had some relief with prednisone  and had trial of Celebrex  (but had diarrhea) and home exercise program.   -stomach volatile on NSAIDs and prednisone  and even  just tylenol - down 6 lbs  Prior to the above had seen Dr. Daneil Dunker on 06/12/2023 for ED follow-up from fall related to escalator at the mall by virtual visit and lumbar spine films showed arthritis without fracture  Today reports pain is improving. Still working with physical therapy- they have also added in balance training- she's concerned about falling and stomach issues A/P: Patient with flareup of degenerative disc disease after recent fall as a possible trigger-she seems to be gradually improving with physical therapy and Tylenol  now-I am glad she is off NSAIDs that seem to upset her stomach and could raise blood pressure as well (versus pain raising blood pressure)  Her bigger concern right now is despite efforts to be gentle with her  diet(Chicken noodle soup, crackers, rice, Pepto bismol, bananas, applesauce. Frozen [place] dinner went down ok last night)-she is still getting a lot of stomach grumbling without significant pain. Gets gas with dairy so would avoid activity.  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 Trending in right direction.  BP Readings from Last 3 Encounters:  07/10/23 (!) 148/62  06/03/23 (!) 156/54  05/28/23 (!) 168/67   A/P: Blood pressure seems to be improving as she is further out from her injury and back pain flare and has been able to reduce her medications-she has not tolerated higher doses of medicine and prefers to tolerate blood pressure less than 150/90 even though not ideal for renal artery stenosis or celiac artery stenosis.  She prefers to continue current medication with amlodipine  5 mg, hydrochlorothiazide  12.5 mg daily, labetalol  twice daily, lisinopril  40 mg daily  Recommended follow up: Return for next already scheduled visit or sooner if needed, followup or sooner if needed.Schedule b4 you  leave. Future Appointments  Date Time Provider Department Center  07/14/2023  1:45 PM Yates Helm, PT [facility]  07/16/2023  3:15 PM Yates Helm, PT [facility]  07/18/2023  3:00 PM [facility]  07/18/2023  3:40 PM Philipp Brawn, MD [facility]  07/21/2023  1:45 PM Speaks, Rosi Converse, PT [facility]  07/23/2023  3:15 PM Speaks, Amy L, PT [facility]  07/28/2023  4:00 PM Speaks, Amy L, PT [facility]  07/30/2023  3:15 PM Speaks, Amy L, PT [facility]  09/01/2023  3:00 PM Arlene Ben Saverio Curling, MD [facility]    Lab/Order associations:   ICD-10-CM   1. Essential hypertension  I10     2. Degeneration of intervertebral disc of lumbar region with discogenic back pain  M51.360      Return precautions advised.  Clarisa Crooked, MD

## 2023-07-14 ENCOUNTER — Ambulatory Visit: Admitting: Physical Therapy

## 2023-07-16 ENCOUNTER — Encounter: Payer: Self-pay | Admitting: Family Medicine

## 2023-07-16 ENCOUNTER — Ambulatory Visit: Admitting: Physical Therapy

## 2023-07-16 ENCOUNTER — Encounter: Payer: Self-pay | Admitting: Physical Therapy

## 2023-07-16 DIAGNOSIS — R293 Abnormal posture: Secondary | ICD-10-CM | POA: Diagnosis not present

## 2023-07-16 DIAGNOSIS — M6281 Muscle weakness (generalized): Secondary | ICD-10-CM | POA: Diagnosis not present

## 2023-07-16 DIAGNOSIS — M5459 Other low back pain: Secondary | ICD-10-CM

## 2023-07-16 DIAGNOSIS — R278 Other lack of coordination: Secondary | ICD-10-CM | POA: Diagnosis not present

## 2023-07-16 DIAGNOSIS — G8929 Other chronic pain: Secondary | ICD-10-CM | POA: Diagnosis not present

## 2023-07-16 DIAGNOSIS — M25512 Pain in left shoulder: Secondary | ICD-10-CM | POA: Diagnosis not present

## 2023-07-16 NOTE — Therapy (Signed)
 OUTPATIENT PHYSICAL THERAPY THORACOLUMBAR TREATMENT   Patient Name: Jane Cox MRN: [id number] DOB:[birth year], 89 y.o., female Today's Date: 07/16/2023  END OF SESSION:  PT End of Session - 07/16/23 1557     Visit Number 3    Date for PT Re-Evaluation 09/24/23    Authorization Type MCR/BCBS    Progress Note Due on Visit 10    PT Start Time 1518    PT Stop Time 1550   limited by HTN   PT Time Calculation (min) 32 min    Activity Tolerance Treatment limited secondary to medical complications (Comment)    Behavior During Therapy [facility] for tasks assessed/performed            Past Medical History:  Diagnosis Date   Allergy    Arthritis    Basal cell carcinoma    HX: breast cancer    Hyperlipidemia    Hypertension    Osteopenia    Past Surgical History:  Procedure Laterality Date   ABDOMINAL HYSTERECTOMY  01/28/1981   BREAST SURGERY     mastectomy   CHOLECYSTECTOMY  01/29/1991   COLONOSCOPY     EYE SURGERY     VEIN SURGERY     Patient Active Problem List   Diagnosis Date Noted   Chronic rhinitis 03/27/2023   Other specified disorders of eustachian tube, bilateral 03/27/2023   Vitamin D  deficiency 03/05/2022   Ruptured Bakers cyst 01/13/2018   Skin cancer, basal cell 04/20/2015   Lumbar degenerative disc disease 05/23/2014   Arthritis of left hip 05/23/2014   Greater trochanteric bursitis of right hip 05/23/2014   Renal artery stenosis (HCC) 12/15/2013   Hyperlipidemia 09/10/2006   Essential hypertension 08/18/2006   Allergic rhinitis 08/18/2006   Osteopenia 08/18/2006   BREAST CANCER, HX OF 08/18/2006    PCP: Clarisa Crooked, MD  REFERRING PROVIDER: Valdene Garret, MD  REFERRING DIAG: LS spine pain  Rationale for Evaluation and Treatment: Rehabilitation  THERAPY DIAG:  Muscle weakness (generalized)  Other low back pain  ONSET DATE: 05/28/23  SUBJECTIVE:                                                                                                                                                                                            SUBJECTIVE STATEMENT:  I'm not feeling great today, took Naproxen  for 2 weeks and have been off of it for 7 days and that really messed up my stomach. Don't feel like I'm going to throw up but stomach is not happy. I did have a fall and I recently asked Dr. Cleora Daft to ask PT to work on my balance  EVAL--Fell on the 3rd step of escalator, then developed lower back pain about 8 days later.  I have tried taking the medicine that they prescribed and it has helped although it upsets my stomach.  I am in pain anytime I am still, especially at night, I just cannot get comfortable  PERTINENT HISTORY:  Spent August until November of 2024 caring for her husband who was in hospice.  Normally doesn't have lower back pain, had bad fall at bottom of escalator and her clothes were caught in the belt and lacerations throughout Ue's. Referred to PT from sports medicine MD.   PAIN:  Are you having pain? Yes: NPRS scale: 5/10 Pain location: central lower back Pain description: freezes or locks up  Aggravating factors: lying down most painful Relieving factors: meds somewhat  PRECAUTIONS: Fall  RED FLAGS: None   WEIGHT BEARING RESTRICTIONS: No  FALLS:  Has patient fallen in last 6 months? Yes. Number of falls 1  LIVING ENVIRONMENT: Lives with: lives with their son Lives in: House/apartment Stairs: no Has following equipment at home: Environmental consultant - 2 wheeled  OCCUPATION: retired  PLOF: Independent  PATIENT GOALS: to manage this pain  NEXT MD VISIT: one month  OBJECTIVE:  Note: Objective measures were completed at Evaluation unless otherwise noted.  DIAGNOSTIC FINDINGS:  Arthritic changes primarily L5/S1, see imaging report in EPIC  PATIENT SURVEYS:  Modified Oswestry Modified Oswestry Low Back Pain Disability Questionnaire: 29 / 50 = 58.0 %   COGNITION: Overall cognitive status: Within functional  limits for tasks assessed     SENSATION: WFL  MUSCLE LENGTH: Hamstrings: Right wfl deg; Left wfl deg Andy Bannister test: Right wfl deg; Left wfl deg  POSTURE: some loss of lumbar lordosis, wider than normal base of support, flexed thoracic mild  PALPATION: Non tender with palpation B post hips, lumbar region  LUMBAR ROM:   AROM eval  Flexion 25p!  Extension 20p!  Right lateral flexion 25p!  Left lateral flexion 25  Right rotation   Left rotation    (Blank rows = not tested)  LOWER EXTREMITY ROM:     Passive  Right eval Left eval  Hip flexion  110 p!  Hip extension    Hip abduction    Hip adduction    Hip internal rotation    Hip external rotation  20p!  Knee flexion    Knee extension    Ankle dorsiflexion heel walk wfl wfl  Ankle plantarflexion toe walk wfl wfl  Ankle inversion    Ankle eversion     (Blank rows = wfl)  LOWER EXTREMITY MMT:  all MMT grossly wnl and non provocative LE's  MMT Right eval Left eval  Hip flexion    Hip extension    Hip abduction    Hip adduction    Hip internal rotation    Hip external rotation    Knee flexion    Knee extension    Ankle dorsiflexion    Ankle plantarflexion    Ankle inversion    Ankle eversion     (Blank rows = not tested)  LUMBAR SPECIAL TESTS:  Straight leg raise test: Negative  FUNCTIONAL TESTS:  5 times sit to stand: 34s, pain L hip  GAIT: Distance walked: in clinic no device, 90' no significant deficits noted  TREATMENT DATE:    07/16/23  BP 190/82 HR 74, 178/80 2nd check  Supine stretches HS 2x30 seconds B  Piriformis stretch 2x30 seconds  Lumbar rotation stretch 12x5 seconds  Supine  TA sets x10  SKTC stretch 5x10 seconds B     07/03/23 NuStep L4 x86mins Supine LE stretches w/moist heat- SKTC, HS, LTR, glutes, piriformis  Feet on pball rotations and knees to chest x10  PPT x10 Supine marching 20 reps alt  Ball squeeze 2x10  STS 2x10  Alt box tap 4   07/02/23:  Evaluation,   Utilized sheet around pelvis, in hook lying position for 3 bouts of static gentle pelvic traction by PT with some relief of pain  Instructed in therex as indicated below, also place moist head under back while performing the ex to improve jt stiffness Needed tactle cues for pelvic tilt and for overpressure with arms for alt knee to chest,                                                                                                                                  PATIENT EDUCATION:  Education details: POC, goals  Person educated: Patient Education method: Explanation, Demonstration, Tactile cues, Verbal cues, and Handouts Education comprehension: verbalized understanding  HOME EXERCISE PROGRAM: Access Code: [email or web address] URL: [email or web address] Date: 07/02/2023 Prepared by: Amy Speaks  Exercises - Supine Lower Trunk Rotation  - 1 x daily - 7 x weekly - 3 sets - 10 reps - Supine Piriformis Stretch with Foot on Ground  - 1 x daily - 7 x weekly - 3 sets - 10 reps - Supine Posterior Pelvic Tilt  - 1 x daily - 7 x weekly - 3 sets - 10 reps  ASSESSMENT:  CLINICAL IMPRESSION:  Arrives today reporting additional referral for balance work. Unfortunately her BP was significantly high, we limited visit to light stretching to avoid further elevation, encouraged her to check BP again later at home and call PCP if still high. Will plan to further address balance next visit if able. Pain down to 3/10 at EOS.   EVAL-Patient is a 89 y.o. female who was evaluated today by physical therapy for recent onset of central lower back pain.  Her pain is consistent with joint stiffness. She has good LE strength, no neurologic Sx.  Very stiff throughout lumbar region.  Tolerated initial session of gentle stretching well.  She should benefit from skilled PT to address her pain and improve her sleeping patterns, movement efficiency.  OBJECTIVE IMPAIRMENTS: decreased activity tolerance, decreased  balance, decreased knowledge of condition, hypomobility, impaired perceived functional ability, and pain.   ACTIVITY LIMITATIONS: carrying, lifting, bending, sitting, squatting, sleeping, transfers, and bed mobility  PARTICIPATION LIMITATIONS: meal prep, cleaning, laundry, driving, shopping, and community activity  PERSONAL FACTORS: Age, Behavior pattern, Past/current experiences, Time since onset of injury/illness/exacerbation, and 1-2 comorbidities: HTN, h/o fall, osteopenia are also affecting patient's functional outcome.   REHAB POTENTIAL: Good  CLINICAL DECISION MAKING: Evolving/moderate complexity  EVALUATION COMPLEXITY: Moderate   GOALS: Goals reviewed with patient? Yes  SHORT TERM GOALS: Target date: 2 weeks, 07/26/23  I HEP Baseline: Goal status:  INITIAL  LONG TERM GOALS: Target date: 12 weeks, 09/24/23  Modified Oswestry 58% improve to 38% Baseline:  Goal status: INITIAL  2.  Improve sleeping pattern, pt able to reduce intensity of pain when awakening by 50%  Baseline: 7 to8/10 pain  Goal status: INITIAL  3.  5 xsit to stand 14.8 sec or less Baseline:  Goal status: INITIAL  4.  Improve lumbar spine Rom all planes to 50 % or greater,  Baseline: 25% Goal status: INITIAL   PLAN:  PT FREQUENCY: 2x/week  PT DURATION: 12 weeks  PLANNED INTERVENTIONS: 97110-Therapeutic exercises, 97530- Therapeutic activity, [id number]- Neuromuscular re-education, 97535- Self Care, and 16109- Manual therapy.  PLAN FOR NEXT SESSION: would trial modalities such as moist heat, possibly e stim, continue with manual pelvic traction, progress with therex to gently improve spinal mobility. Check BP. Has additional referral for balance, need to get objective measures/set goal    Terrel Ferries, PT, DPT 07/16/23 3:58 PM

## 2023-07-18 ENCOUNTER — Ambulatory Visit: Admitting: Vascular Surgery

## 2023-07-18 ENCOUNTER — Encounter (HOSPITAL_COMMUNITY)

## 2023-07-21 ENCOUNTER — Ambulatory Visit

## 2023-07-21 DIAGNOSIS — M5459 Other low back pain: Secondary | ICD-10-CM | POA: Diagnosis not present

## 2023-07-21 DIAGNOSIS — Z9181 History of falling: Secondary | ICD-10-CM

## 2023-07-21 DIAGNOSIS — M6281 Muscle weakness (generalized): Secondary | ICD-10-CM

## 2023-07-21 DIAGNOSIS — R293 Abnormal posture: Secondary | ICD-10-CM | POA: Diagnosis not present

## 2023-07-21 DIAGNOSIS — R278 Other lack of coordination: Secondary | ICD-10-CM

## 2023-07-21 DIAGNOSIS — G8929 Other chronic pain: Secondary | ICD-10-CM

## 2023-07-21 DIAGNOSIS — M25512 Pain in left shoulder: Secondary | ICD-10-CM | POA: Diagnosis not present

## 2023-07-21 NOTE — Therapy (Signed)
 OUTPATIENT PHYSICAL THERAPY THORACOLUMBAR TREATMENT/BALANCE ASSESSMENT   Patient Name: Jane Cox MRN: [id number] DOB:13-Dec-1933, 89 y.o., female Today's Date: 07/22/2023  END OF SESSION:  PT End of Session - 07/21/23 1357     Visit Number 4    Date for PT Re-Evaluation 09/24/23    Authorization Type MCR/BCBS    Progress Note Due on Visit 10    PT Start Time 1347    PT Stop Time 1435    PT Time Calculation (min) 48 min    Activity Tolerance Patient tolerated treatment well    Behavior During Therapy [facility] for tasks assessed/performed             Past Medical History:  Diagnosis Date   Allergy    Arthritis    Basal cell carcinoma    HX: breast cancer    Hyperlipidemia    Hypertension    Osteopenia    Past Surgical History:  Procedure Laterality Date   ABDOMINAL HYSTERECTOMY  01/28/1981   BREAST SURGERY     mastectomy   CHOLECYSTECTOMY  01/29/1991   COLONOSCOPY     EYE SURGERY     VEIN SURGERY     Patient Active Problem List   Diagnosis Date Noted   Chronic rhinitis 03/27/2023   Other specified disorders of eustachian tube, bilateral 03/27/2023   Vitamin D  deficiency 03/05/2022   Ruptured Bakers cyst 01/13/2018   Skin cancer, basal cell 04/20/2015   Lumbar degenerative disc disease 05/23/2014   Arthritis of left hip 05/23/2014   Greater trochanteric bursitis of right hip 05/23/2014   Renal artery stenosis (HCC) 12/15/2013   Hyperlipidemia 09/10/2006   Essential hypertension 08/18/2006   Allergic rhinitis 08/18/2006   Osteopenia 08/18/2006   BREAST CANCER, HX OF 08/18/2006    PCP: Katrinka Senior, MD  REFERRING PROVIDER: Kennyth Bart, MD, for LBP, Morene Mace, DO for balance deficits  REFERRING DIAG: LS spine pain, h/o fall, balance deficits  Rationale for Evaluation and Treatment: Rehabilitation  THERAPY DIAG:  Muscle weakness (generalized) - Plan: PT plan of care cert/re-cert  Other low back pain - Plan: PT plan of care  cert/re-cert  Chronic left shoulder pain - Plan: PT plan of care cert/re-cert  Abnormal posture - Plan: PT plan of care cert/re-cert  Other lack of coordination - Plan: PT plan of care cert/re-cert  History of falling - Plan: PT plan of care cert/re-cert  ONSET DATE: 05/28/23  SUBJECTIVE:  SUBJECTIVE STATEMENT: Did get a change in my meds to help with my elevated blood pressure.  I am afraid of falling again, so when I talked to Dr Leonce he sent in another referral for my balance.  I'm afraid to go out, like to to grocery store.  I also feel like my back is still locked up, really hard to move every morning, although it is some better.   EVAL--Fell on the 3rd step of escalator, then developed lower back pain about 8 days later.  I have tried taking the medicine that they prescribed and it has helped although it upsets my stomach.  I am in pain anytime I am still, especially at night, I just cannot get comfortable  PERTINENT HISTORY:  Spent August until November of 2024 caring for her husband who was in hospice.  Normally doesn't have lower back pain, had bad fall at bottom of escalator and her clothes were caught in the belt and lacerations throughout Ue's. Referred to PT from sports medicine MD.   PAIN:  Are you having pain? Yes: NPRS scale: 5/10 Pain location: central lower back Pain description: freezes or locks up  Aggravating factors: lying down most painful Relieving factors: meds somewhat  PRECAUTIONS: Fall  RED FLAGS: None   WEIGHT BEARING RESTRICTIONS: No  FALLS:  Has patient fallen in last 6 months? Yes. Number of falls 1  LIVING ENVIRONMENT: Lives with: lives with their son Lives in: House/apartment Stairs: no Has following equipment at home: Environmental consultant - 2 wheeled  OCCUPATION:  retired  PLOF: Independent  PATIENT GOALS: to manage this pain  NEXT MD VISIT: one month  OBJECTIVE:  Note: Objective measures were completed at Evaluation unless otherwise noted.  DIAGNOSTIC FINDINGS:  Arthritic changes primarily L5/S1, see imaging report in EPIC  PATIENT SURVEYS:  Modified Oswestry Modified Oswestry Low Back Pain Disability Questionnaire: 29 / 50 = 58.0 %   COGNITION: Overall cognitive status: Within functional limits for tasks assessed     SENSATION: WFL  MUSCLE LENGTH: Hamstrings: Right wfl deg; Left wfl deg Debby test: Right wfl deg; Left wfl deg  POSTURE: some loss of lumbar lordosis, wider than normal base of support, flexed thoracic mild  PALPATION: Non tender with palpation B post hips, lumbar region  LUMBAR ROM:   AROM eval  Flexion 25p!  Extension 20p!  Right lateral flexion 25p!  Left lateral flexion 25  Right rotation   Left rotation    (Blank rows = not tested)  LOWER EXTREMITY ROM:     Passive  Right eval Left eval L 07/22/23  Hip flexion  110 p! Wfl, no p!  Hip extension     Hip abduction     Hip adduction     Hip internal rotation     Hip external rotation  20p! Wfl no p!  Knee flexion     Knee extension     Ankle dorsiflexion heel walk wfl wfl   Ankle plantarflexion toe walk wfl wfl   Ankle inversion     Ankle eversion      (Blank rows = wfl)  LOWER EXTREMITY MMT:  all MMT grossly wnl and non provocative LE's  MMT Right eval Left eval  Hip flexion    Hip extension    Hip abduction    Hip adduction    Hip internal rotation    Hip external rotation    Knee flexion    Knee extension    Ankle dorsiflexion    Ankle  plantarflexion    Ankle inversion    Ankle eversion     (Blank rows = not tested)  LUMBAR SPECIAL TESTS:  Straight leg raise test: Negative  FUNCTIONAL TESTS:  5 times sit to stand: 34s, pain L hip  GAIT: Distance walked: in clinic no device, 90' no significant deficits noted  TREATMENT  DATE:  07/21/23:  Assessed the pt today for her balance , with Berg and with FGA.  Also reassessed L hip ROM per flow chart above BERG 46/56 FGA 17/30  Side lying on R for cross friction massage over B post SI ligs, jt line, also L quadratus lumborum.  Supine for manual pelvic traction with sheet around pelvis, bouts of 60 sec, x 3 with some reported relief of LBP  Instructed in standing heel/toe rocks, mass practice to address balance/postural sway at home Instructed in standing high slow marches to improve single leg stability, mass practice  Inst to utilize at home to address her balance, near a sink or counter for hand hold  07/16/23  BP 190/82 HR 74, 178/80 2nd check  Supine stretches HS 2x30 seconds B  Piriformis stretch 2x30 seconds  Lumbar rotation stretch 12x5 seconds  Supine TA sets x10  SKTC stretch 5x10 seconds B     07/03/23 NuStep L4 x3mins Supine LE stretches w/moist heat- SKTC, HS, LTR, glutes, piriformis  Feet on pball rotations and knees to chest x10  PPT x10 Supine marching 20 reps alt  Ball squeeze 2x10  STS 2x10  Alt box tap 4   07/02/23:  Evaluation,  Utilized sheet around pelvis, in hook lying position for 3 bouts of static gentle pelvic traction by PT with some relief of pain  Instructed in therex as indicated below, also place moist head under back while performing the ex to improve jt stiffness Needed tactle cues for pelvic tilt and for overpressure with arms for alt knee to chest,                                                                                                                                  PATIENT EDUCATION:  Education details: POC, goals  Person educated: Patient Education method: Explanation, Demonstration, Tactile cues, Verbal cues, and Handouts Education comprehension: verbalized understanding  HOME EXERCISE PROGRAM: Access Code: [email or web address] URL: [email or web address] Date: 07/02/2023 Prepared by: Tecumseh Yeagley  Exercises - Supine Lower Trunk Rotation  - 1 x daily - 7 x weekly - 3 sets - 10 reps - Supine Piriformis Stretch with Foot on Ground  - 1 x daily - 7 x weekly - 3 sets - 10 reps - Supine Posterior Pelvic Tilt  - 1 x daily - 7 x weekly - 3 sets - 10 reps  ASSESSMENT:  CLINICAL IMPRESSION:  The patient was assessed today for balance deficits, and new goals created. She scored 17/30 on FGA and 46/56 on the BERG, both scores are indicative of fall risk.  On the FGA she expressed fear with closing her eyes, also with stepping over objects, and on the [place] she had difficulty looking over her L shoulder as well as with her forward reach and any static single leg or tandem stance activities.  Her L sided lower back pain continues to limit her overall mobility.  As she has responded positively to manual pelvic traction will try mechanical pelvic traction next visit.  Also recommend adding postural sway and balance activities to recruit ankle stabilizers.    EVAL-Patient is a 89 y.o. female who was evaluated today by physical therapy for recent onset of central lower back pain.  Her pain is consistent with joint stiffness. She has good LE strength, no neurologic Sx.  Very stiff throughout lumbar region.  Tolerated initial session of gentle stretching well.  She should benefit from skilled PT to address her pain and improve her sleeping patterns, movement efficiency.  OBJECTIVE IMPAIRMENTS: decreased activity tolerance, decreased balance, decreased knowledge of condition, hypomobility, impaired perceived functional ability, and pain.   ACTIVITY LIMITATIONS: carrying, lifting, bending, sitting, squatting, sleeping, transfers, and bed mobility  PARTICIPATION LIMITATIONS: meal prep, cleaning, laundry, driving, shopping, and community activity  PERSONAL FACTORS: Age, Behavior pattern, Past/current experiences, Time since onset of injury/illness/exacerbation, and 1-2 comorbidities: HTN, h/o fall,  osteopenia are also affecting patient's functional outcome.   REHAB POTENTIAL: Good  CLINICAL DECISION MAKING: Evolving/moderate complexity  EVALUATION COMPLEXITY: Moderate   GOALS: Goals reviewed with patient? Yes  SHORT TERM GOALS: Target date: 2 weeks, 07/26/23  I HEP Baseline: Goal status: INITIAL  LONG TERM GOALS: Target date: 12 weeks, 09/24/23  Modified Oswestry 58% improve to 38% Baseline:  Goal status: INITIAL  2.  Improve sleeping pattern, pt able to reduce intensity of pain when awakening by 50%  Baseline: 7 to8/10 pain  Goal status: INITIAL  3.  5 xsit to stand 14.8 sec or less Baseline:  Goal status: INITIAL  4.  Improve lumbar spine Rom all planes to 50 % or greater,  Baseline: 25% Goal status: INITIAL  5.  07/21/23:New goal: FGA improve from 17/30 to 25/30  6.  07/21/23: New goal: Berg improve from 46/56   PLAN:  PT FREQUENCY: 2x/week  PT DURATION: 12 weeks  PLANNED INTERVENTIONS: 97110-Therapeutic exercises, 97530- Therapeutic activity, 97112- Neuromuscular re-education, 97535- Self Care, and 02859- Manual therapy.  PLAN FOR NEXT SESSION: add mechanical pelvic traction, continue manual techniques for pain relief, add more balance activities, ankle strategies  Yolonda Purtle PT, DPT, OCS 07/22/23 9:22 AM

## 2023-07-22 ENCOUNTER — Other Ambulatory Visit: Payer: Self-pay

## 2023-07-23 ENCOUNTER — Ambulatory Visit

## 2023-07-23 DIAGNOSIS — R278 Other lack of coordination: Secondary | ICD-10-CM | POA: Diagnosis not present

## 2023-07-23 DIAGNOSIS — M5459 Other low back pain: Secondary | ICD-10-CM

## 2023-07-23 DIAGNOSIS — Z9181 History of falling: Secondary | ICD-10-CM

## 2023-07-23 DIAGNOSIS — M6281 Muscle weakness (generalized): Secondary | ICD-10-CM | POA: Diagnosis not present

## 2023-07-23 DIAGNOSIS — M25512 Pain in left shoulder: Secondary | ICD-10-CM | POA: Diagnosis not present

## 2023-07-23 DIAGNOSIS — R293 Abnormal posture: Secondary | ICD-10-CM | POA: Diagnosis not present

## 2023-07-23 DIAGNOSIS — G8929 Other chronic pain: Secondary | ICD-10-CM | POA: Diagnosis not present

## 2023-07-23 NOTE — Therapy (Signed)
 OUTPATIENT PHYSICAL THERAPY THORACOLUMBAR TREATMENT/BALANCE ASSESSMENT   Patient Name: Jane Cox MRN: [id number] DOB:[birth year], 89 y.o., female Today's Date: 07/23/2023  END OF SESSION:  PT End of Session - 07/23/23 1526     Visit Number 5    Date for PT Re-Evaluation 09/24/23    Authorization Type MCR/BCBS    Progress Note Due on Visit 10    PT Start Time 1515    PT Stop Time 1600    PT Time Calculation (min) 45 min    Activity Tolerance Patient tolerated treatment well    Behavior During Therapy WFL for tasks assessed/performed             Past Medical History:  Diagnosis Date   Allergy    Arthritis    Basal cell carcinoma    HX: breast cancer    Hyperlipidemia    Hypertension    Osteopenia    Past Surgical History:  Procedure Laterality Date   ABDOMINAL HYSTERECTOMY  01/28/1981   BREAST SURGERY     mastectomy   CHOLECYSTECTOMY  01/29/1991   COLONOSCOPY     EYE SURGERY     VEIN SURGERY     Patient Active Problem List   Diagnosis Date Noted   Chronic rhinitis 03/27/2023   Other specified disorders of eustachian tube, bilateral 03/27/2023   Vitamin D  deficiency 03/05/2022   Ruptured Bakers cyst 01/13/2018   Skin cancer, basal cell 04/20/2015   Lumbar degenerative disc disease 05/23/2014   Arthritis of left hip 05/23/2014   Greater trochanteric bursitis of right hip 05/23/2014   Renal artery stenosis (HCC) 12/15/2013   Hyperlipidemia 09/10/2006   Essential hypertension 08/18/2006   Allergic rhinitis 08/18/2006   Osteopenia 08/18/2006   BREAST CANCER, HX OF 08/18/2006    PCP: Katrinka Senior, MD  REFERRING PROVIDER: Kennyth Bart, MD, for LBP, Morene Mace, DO for balance deficits  REFERRING DIAG: LS spine pain, h/o fall, balance deficits  Rationale for Evaluation and Treatment: Rehabilitation  THERAPY DIAG:  Muscle weakness (generalized)  Other low back pain  Chronic left shoulder pain  Abnormal posture  Other lack of  coordination  History of falling  ONSET DATE: 05/28/23  SUBJECTIVE:                                                                                                                                                                                           SUBJECTIVE STATEMENT: I think I'm feeling a little better, still afraid of falling.  No quite as much back pain in am EVAL--Fell on the 3rd step of escalator, then developed lower back pain about 8 days later.  I have tried taking the medicine that they prescribed and it has helped although it upsets my stomach.  I am in pain anytime I am still, especially at night, I just cannot get comfortable  PERTINENT HISTORY:  Spent August until November of 2024 caring for her husband who was in hospice.  Normally doesn't have lower back pain, had bad fall at bottom of escalator and her clothes were caught in the belt and lacerations throughout Ue's. Referred to PT from sports medicine MD.   PAIN:  Are you having pain? Yes: NPRS scale: 5/10 Pain location: central lower back Pain description: freezes or locks up  Aggravating factors: lying down most painful Relieving factors: meds somewhat  PRECAUTIONS: Fall  RED FLAGS: None   WEIGHT BEARING RESTRICTIONS: No  FALLS:  Has patient fallen in last 6 months? Yes. Number of falls 1  LIVING ENVIRONMENT: Lives with: lives with their son Lives in: House/apartment Stairs: no Has following equipment at home: Environmental consultant - 2 wheeled  OCCUPATION: retired  PLOF: Independent  PATIENT GOALS: to manage this pain  NEXT MD VISIT: one month  OBJECTIVE:  Note: Objective measures were completed at Evaluation unless otherwise noted.  DIAGNOSTIC FINDINGS:  Arthritic changes primarily L5/S1, see imaging report in EPIC  PATIENT SURVEYS:  Modified Oswestry Modified Oswestry Low Back Pain Disability Questionnaire: 29 / 50 = 58.0 %   COGNITION: Overall cognitive status: Within functional limits for tasks  assessed     SENSATION: WFL  MUSCLE LENGTH: Hamstrings: Right wfl deg; Left wfl deg Debby test: Right wfl deg; Left wfl deg  POSTURE: some loss of lumbar lordosis, wider than normal base of support, flexed thoracic mild  PALPATION: Non tender with palpation B post hips, lumbar region  LUMBAR ROM:   AROM eval  Flexion 25p!  Extension 20p!  Right lateral flexion 25p!  Left lateral flexion 25  Right rotation   Left rotation    (Blank rows = not tested)  LOWER EXTREMITY ROM:     Passive  Right eval Left eval L 07/22/23  Hip flexion  110 p! Wfl, no p!  Hip extension     Hip abduction     Hip adduction     Hip internal rotation     Hip external rotation  20p! Wfl no p!  Knee flexion     Knee extension     Ankle dorsiflexion heel walk wfl wfl   Ankle plantarflexion toe walk wfl wfl   Ankle inversion     Ankle eversion      (Blank rows = wfl)  LOWER EXTREMITY MMT:  all MMT grossly wnl and non provocative LE's  MMT Right eval Left eval  Hip flexion    Hip extension    Hip abduction    Hip adduction    Hip internal rotation    Hip external rotation    Knee flexion    Knee extension    Ankle dorsiflexion    Ankle plantarflexion    Ankle inversion    Ankle eversion     (Blank rows = not tested)  LUMBAR SPECIAL TESTS:  Straight leg raise test: Negative  FUNCTIONAL TESTS:  5 times sit to stand: 34s, pain L hip  GAIT: Distance walked: in clinic no device, 90' no significant deficits noted  TREATMENT DATE:  07/23/23: Initially utilized supine pelvic traction, 50#, x 8 min, then pat c/o abdominal discomfort so we discontinued Supine for therapist assisted stretching lumbar region, alt piriformis stretch, also B  knee to chest stretch with legs on therapists leg, manually assisted light lower trunk rotation  Side lying R for cross friction massage L SI, quadratus lumborum, R SI Pt sitting for forward roll outs with B hands on physioball for, and side to  side  Balance activities: standing in ll bars for reaching behind each shoulder to wall , working on wt shift and trunk rotation without loss of balance Heel/toe rocks on airex in ll bars Standing for pillow case slides for/ back, side/ side   Seated for forward flexion stretch of trunk with B hands on cane to replicate the forward physioball roll at home    07/21/23:  Assessed the pt today for her balance , with Berg and with FGA.  Also reassessed L hip ROM per flow chart above BERG 46/56 FGA 17/30  Side lying on R for cross friction massage over B post SI ligs, jt line, also L quadratus lumborum.  Supine for manual pelvic traction with sheet around pelvis, bouts of 60 sec, x 3 with some reported relief of LBP  Instructed in standing heel/toe rocks, mass practice to address balance/postural sway at home Instructed in standing high slow marches to improve single leg stability, mass practice  Inst to utilize at home to address her balance, near a sink or counter for hand hold  07/16/23  BP 190/82 HR 74, 178/80 2nd check  Supine stretches HS 2x30 seconds B  Piriformis stretch 2x30 seconds  Lumbar rotation stretch 12x5 seconds  Supine TA sets x10  SKTC stretch 5x10 seconds B     07/03/23 NuStep L4 x50mins Supine LE stretches w/moist heat- SKTC, HS, LTR, glutes, piriformis  Feet on pball rotations and knees to chest x10  PPT x10 Supine marching 20 reps alt  Ball squeeze 2x10  STS 2x10  Alt box tap 4   07/02/23:  Evaluation,  Utilized sheet around pelvis, in hook lying position for 3 bouts of static gentle pelvic traction by PT with some relief of pain  Instructed in therex as indicated below, also place moist head under back while performing the ex to improve jt stiffness Needed tactle cues for pelvic tilt and for overpressure with arms for alt knee to chest,                                                                                                                                   PATIENT EDUCATION:  Education details: POC, goals  Person educated: Patient Education method: Explanation, Demonstration, Tactile cues, Verbal cues, and Handouts Education comprehension: verbalized understanding  HOME EXERCISE PROGRAM: Access Code: [email or web address] URL: [email or web address] Date: 07/02/2023 Prepared by: Ajee Heasley  Exercises - Supine Lower Trunk Rotation  - 1 x daily - 7 x weekly - 3 sets - 10 reps - Supine Piriformis Stretch with Foot on Ground  - 1 x daily - 7 x weekly - 3 sets - 10 reps - Supine Posterior  Pelvic Tilt  - 1 x daily - 7 x weekly - 3 sets - 10 reps  ASSESSMENT:  CLINICAL IMPRESSION:  The patient continued with physical therapy today to address lower back pain and stiffness as well as balance deficits.  She did not tolerate the pelvic traction due to abdominal discomfort .  We progressed with some balance activities to address her confidence , postural sway.    EVAL-Patient is a 89 y.o. female who was evaluated today by physical therapy for recent onset of central lower back pain.  Her pain is consistent with joint stiffness. She has good LE strength, no neurologic Sx.  Very stiff throughout lumbar region.  Tolerated initial session of gentle stretching well.  She should benefit from skilled PT to address her pain and improve her sleeping patterns, movement efficiency.  OBJECTIVE IMPAIRMENTS: decreased activity tolerance, decreased balance, decreased knowledge of condition, hypomobility, impaired perceived functional ability, and pain.   ACTIVITY LIMITATIONS: carrying, lifting, bending, sitting, squatting, sleeping, transfers, and bed mobility  PARTICIPATION LIMITATIONS: meal prep, cleaning, laundry, driving, shopping, and community activity  PERSONAL FACTORS: Age, Behavior pattern, Past/current experiences, Time since onset of injury/illness/exacerbation, and 1-2 comorbidities: HTN, h/o fall, osteopenia are also affecting patient's  functional outcome.   REHAB POTENTIAL: Good  CLINICAL DECISION MAKING: Evolving/moderate complexity  EVALUATION COMPLEXITY: Moderate   GOALS: Goals reviewed with patient? Yes  SHORT TERM GOALS: Target date: 2 weeks, 07/26/23  I HEP Baseline: Goal status: INITIAL  LONG TERM GOALS: Target date: 12 weeks, 09/24/23  Modified Oswestry 58% improve to 38% Baseline:  Goal status: INITIAL  2.  Improve sleeping pattern, pt able to reduce intensity of pain when awakening by 50%  Baseline: 7 to8/10 pain  Goal status: INITIAL  3.  5 xsit to stand 14.8 sec or less Baseline:  Goal status: INITIAL  4.  Improve lumbar spine Rom all planes to 50 % or greater,  Baseline: 25% Goal status: INITIAL  5.  07/21/23:New goal: FGA improve from 17/30 to 25/30  6.  07/21/23: New goal: Berg improve from 46/56   PLAN:  PT FREQUENCY: 2x/week  PT DURATION: 12 weeks  PLANNED INTERVENTIONS: 97110-Therapeutic exercises, 97530- Therapeutic activity, 97112- Neuromuscular re-education, 97535- Self Care, and 02859- Manual therapy.  PLAN FOR NEXT SESSION: continue manual techniques for pain relief, and balance activities, ankle strategies  Pierina Schuknecht PT, DPT, OCS 07/23/23 5:28 PM

## 2023-07-28 ENCOUNTER — Ambulatory Visit

## 2023-07-30 ENCOUNTER — Ambulatory Visit: Attending: Family Medicine

## 2023-07-30 ENCOUNTER — Other Ambulatory Visit: Payer: Self-pay

## 2023-07-30 DIAGNOSIS — Z9181 History of falling: Secondary | ICD-10-CM | POA: Diagnosis not present

## 2023-07-30 DIAGNOSIS — R293 Abnormal posture: Secondary | ICD-10-CM | POA: Diagnosis not present

## 2023-07-30 DIAGNOSIS — R278 Other lack of coordination: Secondary | ICD-10-CM | POA: Diagnosis not present

## 2023-07-30 DIAGNOSIS — M5459 Other low back pain: Secondary | ICD-10-CM | POA: Insufficient documentation

## 2023-07-30 DIAGNOSIS — M6281 Muscle weakness (generalized): Secondary | ICD-10-CM | POA: Diagnosis not present

## 2023-07-30 DIAGNOSIS — M25512 Pain in left shoulder: Secondary | ICD-10-CM | POA: Insufficient documentation

## 2023-07-30 DIAGNOSIS — G8929 Other chronic pain: Secondary | ICD-10-CM | POA: Diagnosis not present

## 2023-07-30 NOTE — Therapy (Signed)
 OUTPATIENT PHYSICAL THERAPY THORACOLUMBAR TREATMENT/BALANCE ASSESSMENT   Patient Name: Jane Cox MRN: [id number] DOB:May 15, 1933, 89 y.o., female Today's Date: 07/30/2023  END OF SESSION:  PT End of Session - 07/30/23 1544     Visit Number 6    Date for PT Re-Evaluation 09/24/23    Authorization Type MCR/BCBS    Progress Note Due on Visit 10    PT Start Time 1515    PT Stop Time 1600    PT Time Calculation (min) 45 min    Activity Tolerance Patient tolerated treatment well    Behavior During Therapy [facility] for tasks assessed/performed              Past Medical History:  Diagnosis Date   Allergy    Arthritis    Basal cell carcinoma    HX: breast cancer    Hyperlipidemia    Hypertension    Osteopenia    Past Surgical History:  Procedure Laterality Date   ABDOMINAL HYSTERECTOMY  01/28/1981   BREAST SURGERY     mastectomy   CHOLECYSTECTOMY  01/29/1991   COLONOSCOPY     EYE SURGERY     VEIN SURGERY     Patient Active Problem List   Diagnosis Date Noted   Chronic rhinitis 03/27/2023   Other specified disorders of eustachian tube, bilateral 03/27/2023   Vitamin D  deficiency 03/05/2022   Ruptured Bakers cyst 01/13/2018   Skin cancer, basal cell 04/20/2015   Lumbar degenerative disc disease 05/23/2014   Arthritis of left hip 05/23/2014   Greater trochanteric bursitis of right hip 05/23/2014   Renal artery stenosis (HCC) 12/15/2013   Hyperlipidemia 09/10/2006   Essential hypertension 08/18/2006   Allergic rhinitis 08/18/2006   Osteopenia 08/18/2006   BREAST CANCER, HX OF 08/18/2006    PCP: Katrinka Senior, MD  REFERRING PROVIDER: Kennyth Bart, MD, for LBP, Morene Mace, DO for balance deficits  REFERRING DIAG: LS spine pain, h/o fall, balance deficits  Rationale for Evaluation and Treatment: Rehabilitation  THERAPY DIAG:  Muscle weakness (generalized)  Other low back pain  Chronic left shoulder pain  Abnormal posture  History of  falling  Other lack of coordination  ONSET DATE: 05/28/23  SUBJECTIVE:                                                                                                                                                                                           SUBJECTIVE STATEMENT: I think I'm feeling  better, plan to go out to the grocery store sometime in the next few days for the first time since I fell on the escalator.   I feel more  confident, not as afraid of falling.   EVAL--Fell on the 3rd step of escalator, then developed lower back pain about 8 days later.  I have tried taking the medicine that they prescribed and it has helped although it upsets my stomach.  I am in pain anytime I am still, especially at night, I just cannot get comfortable  PERTINENT HISTORY:  Spent August until November of 2024 caring for her husband who was in hospice.  Normally doesn't have lower back pain, had bad fall at bottom of escalator and her clothes were caught in the belt and lacerations throughout Ue's. Referred to PT from sports medicine MD.   PAIN:  Are you having pain? Yes: NPRS scale: 5/10 Pain location: central lower back Pain description: freezes or locks up  Aggravating factors: lying down most painful Relieving factors: meds somewhat  PRECAUTIONS: Fall  RED FLAGS: None   WEIGHT BEARING RESTRICTIONS: No  FALLS:  Has patient fallen in last 6 months? Yes. Number of falls 1  LIVING ENVIRONMENT: Lives with: lives with their son Lives in: House/apartment Stairs: no Has following equipment at home: Environmental consultant - 2 wheeled  OCCUPATION: retired  PLOF: Independent  PATIENT GOALS: to manage this pain  NEXT MD VISIT: one month  OBJECTIVE:  Note: Objective measures were completed at Evaluation unless otherwise noted.  DIAGNOSTIC FINDINGS:  Arthritic changes primarily L5/S1, see imaging report in EPIC  PATIENT SURVEYS:  Modified Oswestry Modified Oswestry Low Back Pain Disability  Questionnaire: 29 / 50 = 58.0 %   COGNITION: Overall cognitive status: Within functional limits for tasks assessed     SENSATION: WFL  MUSCLE LENGTH: Hamstrings: Right wfl deg; Left wfl deg Debby test: Right wfl deg; Left wfl deg  POSTURE: some loss of lumbar lordosis, wider than normal base of support, flexed thoracic mild  PALPATION: Non tender with palpation B post hips, lumbar region  LUMBAR ROM:   AROM eval  Flexion 25p!  Extension 20p!  Right lateral flexion 25p!  Left lateral flexion 25  Right rotation   Left rotation    (Blank rows = not tested)  LOWER EXTREMITY ROM:     Passive  Right eval Left eval L 07/22/23  Hip flexion  110 p! Wfl, no p!  Hip extension     Hip abduction     Hip adduction     Hip internal rotation     Hip external rotation  20p! Wfl no p!  Knee flexion     Knee extension     Ankle dorsiflexion heel walk wfl wfl   Ankle plantarflexion toe walk wfl wfl   Ankle inversion     Ankle eversion      (Blank rows = wfl)  LOWER EXTREMITY MMT:  all MMT grossly wnl and non provocative LE's  MMT Right eval Left eval  Hip flexion    Hip extension    Hip abduction    Hip adduction    Hip internal rotation    Hip external rotation    Knee flexion    Knee extension    Ankle dorsiflexion    Ankle plantarflexion    Ankle inversion    Ankle eversion     (Blank rows = not tested)  LUMBAR SPECIAL TESTS:  Straight leg raise test: Negative  FUNCTIONAL TESTS:  5 times sit to stand: 34s, pain L hip  GAIT: Distance walked: in clinic no device, 90' no significant deficits noted  TREATMENT DATE:  07/30/23:  Nustep Level 3  x 5 min, Ue's and LE's Supine for therapist assisted B knee to chest, with additional lower trunk rotation Supine Therapist assisted, manual stretching for piriformis B Side lying on each side for deep cross friction massage gluteals  Seated forward rolls with hands on 65 cm physioball to stretch lumbar region, thoracic  spine  Neuromuscular re education: standing in ll bars for reaching behind each shoulder to wall , working on wt shift and trunk rotation without loss of balance Heel/toe rocks on airex in ll bars Standing for pillow case slides for/ back, side/ side mass practice Standing in llbars with back to wall for reaches posterior to targets behind her Standing high marches, 4 to 5 sec holds, pt holding hand rail L  Up/ down 6 step within ll bars, tends to have poor control requiring CGA and use of rail to descend off step.     07/23/23: Initially utilized supine pelvic traction, 50#, x 8 min, then pat c/o abdominal discomfort so we discontinued Supine for therapist assisted stretching lumbar region, alt piriformis stretch, also B knee to chest stretch with legs on therapists leg, manually assisted light lower trunk rotation  Side lying R for cross friction massage L SI, quadratus lumborum, R SI Pt sitting for forward roll outs with B hands on physioball for, and side to side  Balance activities: standing in ll bars for reaching behind each shoulder to wall , working on wt shift and trunk rotation without loss of balance Heel/toe rocks on airex in ll bars Standing for pillow case slides for/ back, side/ side   Seated for forward flexion stretch of trunk with B hands on cane to replicate the forward physioball roll at home    07/21/23:  Assessed the pt today for her balance , with Berg and with FGA.  Also reassessed L hip ROM per flow chart above BERG 46/56 FGA 17/30  Side lying on R for cross friction massage over B post SI ligs, jt line, also L quadratus lumborum.  Supine for manual pelvic traction with sheet around pelvis, bouts of 60 sec, x 3 with some reported relief of LBP  Instructed in standing heel/toe rocks, mass practice to address balance/postural sway at home Instructed in standing high slow marches to improve single leg stability, mass practice  Inst to utilize at home to  address her balance, near a sink or counter for hand hold  07/16/23  BP 190/82 HR 74, 178/80 2nd check  Supine stretches HS 2x30 seconds B  Piriformis stretch 2x30 seconds  Lumbar rotation stretch 12x5 seconds  Supine TA sets x10  SKTC stretch 5x10 seconds B     07/03/23 NuStep L4 x42mins Supine LE stretches w/moist heat- SKTC, HS, LTR, glutes, piriformis  Feet on pball rotations and knees to chest x10  PPT x10 Supine marching 20 reps alt  Ball squeeze 2x10  STS 2x10  Alt box tap 4   07/02/23:  Evaluation,  Utilized sheet around pelvis, in hook lying position for 3 bouts of static gentle pelvic traction by PT with some relief of pain  Instructed in therex as indicated below, also place moist head under back while performing the ex to improve jt stiffness Needed tactle cues for pelvic tilt and for overpressure with arms for alt knee to chest,  PATIENT EDUCATION:  Education details: POC, goals  Person educated: Patient Education method: Explanation, Demonstration, Tactile cues, Verbal cues, and Handouts Education comprehension: verbalized understanding  HOME EXERCISE PROGRAM: Access Code: [email or web address] URL: [email or web address] Date: 07/02/2023 Prepared by: Anwen Cannedy  Exercises - Supine Lower Trunk Rotation  - 1 x daily - 7 x weekly - 3 sets - 10 reps - Supine Piriformis Stretch with Foot on Ground  - 1 x daily - 7 x weekly - 3 sets - 10 reps - Supine Posterior Pelvic Tilt  - 1 x daily - 7 x weekly - 3 sets - 10 reps  ASSESSMENT:  CLINICAL IMPRESSION:  The patient continued with physical therapy today to address lower back pain and stiffness as well as balance deficits.  We continued with gentle stretching and decompression B lumbar region, quadratus lumborum, and . Progressed with more difficult balance activities to address her  confidence , postural sway.  She demonstrated much improved control with the pillow case slides as well as the standing reaches with posterior trunk rotation.she did not c/o back pain during today's session.   EVAL-Patient is a 89 y.o. female who was evaluated today by physical therapy for recent onset of central lower back pain.  Her pain is consistent with joint stiffness. She has good LE strength, no neurologic Sx.  Very stiff throughout lumbar region.  Tolerated initial session of gentle stretching well.  She should benefit from skilled PT to address her pain and improve her sleeping patterns, movement efficiency.  OBJECTIVE IMPAIRMENTS: decreased activity tolerance, decreased balance, decreased knowledge of condition, hypomobility, impaired perceived functional ability, and pain.   ACTIVITY LIMITATIONS: carrying, lifting, bending, sitting, squatting, sleeping, transfers, and bed mobility  PARTICIPATION LIMITATIONS: meal prep, cleaning, laundry, driving, shopping, and community activity  PERSONAL FACTORS: Age, Behavior pattern, Past/current experiences, Time since onset of injury/illness/exacerbation, and 1-2 comorbidities: HTN, h/o fall, osteopenia are also affecting patient's functional outcome.   REHAB POTENTIAL: Good  CLINICAL DECISION MAKING: Evolving/moderate complexity  EVALUATION COMPLEXITY: Moderate   GOALS: Goals reviewed with patient? Yes  SHORT TERM GOALS: Target date: 2 weeks, 07/26/23  I HEP Baseline: Goal status: INITIAL  LONG TERM GOALS: Target date: 12 weeks, 09/24/23  Modified Oswestry 58% improve to 38% Baseline:  Goal status: INITIAL  2.  Improve sleeping pattern, pt able to reduce intensity of pain when awakening by 50%  Baseline: 7 to8/10 pain  Goal status: INITIAL  3.  5 xsit to stand 14.8 sec or less Baseline:  Goal status: INITIAL  4.  Improve lumbar spine Rom all planes to 50 % or greater,  Baseline: 25% Goal status: INITIAL  5.  07/21/23:New  goal: FGA improve from 17/30 to 25/30  6.  07/21/23: New goal: Berg improve from 46/56   PLAN:  PT FREQUENCY: 2x/week  PT DURATION: 12 weeks  PLANNED INTERVENTIONS: 97110-Therapeutic exercises, 97530- Therapeutic activity, 97112- Neuromuscular re-education, 97535- Self Care, and [id number]- Manual therapy.  PLAN FOR NEXT SESSION: continue manual techniques for pain relief, and balance activities, ankle strategies  Calen Geister PT, DPT, OCS 07/30/23 4:06 PM

## 2023-07-31 ENCOUNTER — Other Ambulatory Visit: Payer: Self-pay | Admitting: Sports Medicine

## 2023-08-03 ENCOUNTER — Other Ambulatory Visit: Payer: Self-pay | Admitting: Family Medicine

## 2023-08-04 ENCOUNTER — Ambulatory Visit

## 2023-08-04 ENCOUNTER — Other Ambulatory Visit: Payer: Self-pay

## 2023-08-04 DIAGNOSIS — Z9181 History of falling: Secondary | ICD-10-CM | POA: Diagnosis not present

## 2023-08-04 DIAGNOSIS — R293 Abnormal posture: Secondary | ICD-10-CM

## 2023-08-04 DIAGNOSIS — M25512 Pain in left shoulder: Secondary | ICD-10-CM | POA: Diagnosis not present

## 2023-08-04 DIAGNOSIS — G8929 Other chronic pain: Secondary | ICD-10-CM | POA: Diagnosis not present

## 2023-08-04 DIAGNOSIS — M6281 Muscle weakness (generalized): Secondary | ICD-10-CM | POA: Diagnosis not present

## 2023-08-04 DIAGNOSIS — M5459 Other low back pain: Secondary | ICD-10-CM

## 2023-08-04 NOTE — Therapy (Signed)
 OUTPATIENT PHYSICAL THERAPY THORACOLUMBAR TREATMENT/BALANCE ASSESSMENT   Patient Name: Jane Cox MRN: [id number] DOB:July 13, 1933, 89 y.o., female Today's Date: 08/04/2023  END OF SESSION:  PT End of Session - 08/04/23 1429     Visit Number 7    Date for PT Re-Evaluation 09/24/23    Authorization Type MCR/BCBS    Progress Note Due on Visit 10    PT Start Time 1425    PT Stop Time 1510    PT Time Calculation (min) 45 min    Activity Tolerance Patient tolerated treatment well    Behavior During Therapy [facility] for tasks assessed/performed               Past Medical History:  Diagnosis Date   Allergy    Arthritis    Basal cell carcinoma    HX: breast cancer    Hyperlipidemia    Hypertension    Osteopenia    Past Surgical History:  Procedure Laterality Date   ABDOMINAL HYSTERECTOMY  01/28/1981   BREAST SURGERY     mastectomy   CHOLECYSTECTOMY  01/29/1991   COLONOSCOPY     EYE SURGERY     VEIN SURGERY     Patient Active Problem List   Diagnosis Date Noted   Chronic rhinitis 03/27/2023   Other specified disorders of eustachian tube, bilateral 03/27/2023   Vitamin D  deficiency 03/05/2022   Ruptured Bakers cyst 01/13/2018   Skin cancer, basal cell 04/20/2015   Lumbar degenerative disc disease 05/23/2014   Arthritis of left hip 05/23/2014   Greater trochanteric bursitis of right hip 05/23/2014   Renal artery stenosis (HCC) 12/15/2013   Hyperlipidemia 09/10/2006   Essential hypertension 08/18/2006   Allergic rhinitis 08/18/2006   Osteopenia 08/18/2006   BREAST CANCER, HX OF 08/18/2006    PCP: Katrinka Senior, MD  REFERRING PROVIDER: Kennyth Bart, MD, for LBP, Morene Mace, DO for balance deficits  REFERRING DIAG: LS spine pain, h/o fall, balance deficits  Rationale for Evaluation and Treatment: Rehabilitation  THERAPY DIAG:  Muscle weakness (generalized)  Other low back pain  Abnormal posture  History of falling  ONSET DATE:  05/28/23  SUBJECTIVE:                                                                                                                                                                                           SUBJECTIVE STATEMENT: overall much better, was able to bend down enough to tie my shoes this weekend and today, have been using zip up shoes.  Also went to church, had to do one curb and was fine with my son;s help.  Some hip soreness  the last time I was here but got better after one night, maybe due to the theragun massager. I think I'm feeling  better, plan to go out to the grocery store sometime in the next few days for the first time since I fell on the escalator.   I feel more confident, not as afraid of falling.   EVAL--Fell on the 3rd step of escalator, then developed lower back pain about 8 days later.  I have tried taking the medicine that they prescribed and it has helped although it upsets my stomach.  I am in pain anytime I am still, especially at night, I just cannot get comfortable  PERTINENT HISTORY:  Spent August until November of 2024 caring for her husband who was in hospice.  Normally doesn't have lower back pain, had bad fall at bottom of escalator and her clothes were caught in the belt and lacerations throughout Ue's. Referred to PT from sports medicine MD.   PAIN:  Are you having pain? Yes: NPRS scale: 5/10 Pain location: central lower back Pain description: freezes or locks up  Aggravating factors: lying down most painful Relieving factors: meds somewhat  PRECAUTIONS: Fall  RED FLAGS: None   WEIGHT BEARING RESTRICTIONS: No  FALLS:  Has patient fallen in last 6 months? Yes. Number of falls 1  LIVING ENVIRONMENT: Lives with: lives with their son Lives in: House/apartment Stairs: no Has following equipment at home: Environmental consultant - 2 wheeled  OCCUPATION: retired  PLOF: Independent  PATIENT GOALS: to manage this pain  NEXT MD VISIT: one month  OBJECTIVE:   Note: Objective measures were completed at Evaluation unless otherwise noted.  DIAGNOSTIC FINDINGS:  Arthritic changes primarily L5/S1, see imaging report in EPIC  PATIENT SURVEYS:  Modified Oswestry Modified Oswestry Low Back Pain Disability Questionnaire: 29 / 50 = 58.0 %   COGNITION: Overall cognitive status: Within functional limits for tasks assessed     SENSATION: WFL  MUSCLE LENGTH: Hamstrings: Right wfl deg; Left wfl deg Debby test: Right wfl deg; Left wfl deg  POSTURE: some loss of lumbar lordosis, wider than normal base of support, flexed thoracic mild  PALPATION: Non tender with palpation B post hips, lumbar region  LUMBAR ROM:   AROM eval  Flexion 25p!  Extension 20p!  Right lateral flexion 25p!  Left lateral flexion 25  Right rotation   Left rotation    (Blank rows = not tested)  LOWER EXTREMITY ROM:     Passive  Right eval Left eval L 07/22/23  Hip flexion  110 p! Wfl, no p!  Hip extension     Hip abduction     Hip adduction     Hip internal rotation     Hip external rotation  20p! Wfl no p!  Knee flexion     Knee extension     Ankle dorsiflexion heel walk wfl wfl   Ankle plantarflexion toe walk wfl wfl   Ankle inversion     Ankle eversion      (Blank rows = wfl)  LOWER EXTREMITY MMT:  all MMT grossly wnl and non provocative LE's  MMT Right eval Left eval  Hip flexion    Hip extension    Hip abduction    Hip adduction    Hip internal rotation    Hip external rotation    Knee flexion    Knee extension    Ankle dorsiflexion    Ankle plantarflexion    Ankle inversion    Ankle eversion     (  Blank rows = not tested)  LUMBAR SPECIAL TESTS:  Straight leg raise test: Negative  FUNCTIONAL TESTS:  5 times sit to stand: 34s, pain L hip  GAIT: Distance walked: in clinic no device, 90' no significant deficits noted  TREATMENT DATE:  08/04/23:  Nustep L 4 x 6 min UE;s and LE's Supine for manual assisted stretching by PT for B knee to  chest, also gentle rocking Supine with physioball under feet for B heel to chest  Supine with physioball LTR Supine with physioball bridging Seated forward rolls B hands on physioball Seated rows 10# x 10 reps Standing wall slides up wall with B hands to stretch spine into extension. Standing for pillow case slides under each foot, with light one hand support Resisted gait 10#, side stepping 5 reps each, pt required Min assist for R to L side stepping, CGA for L side stepping Forward step downs from 2 step with hand hold support, 10 x each leg Seated rows with yellow t band    07/30/23:  Nustep Level 3 x 5 min, Ue's and LE's Supine for therapist assisted B knee to chest, with additional lower trunk rotation Supine Therapist assisted, manual stretching for piriformis B Side lying on each side for deep cross friction massage gluteals  Seated forward rolls with hands on 65 cm physioball to stretch lumbar region, thoracic spine  Neuromuscular re education: standing in ll bars for reaching behind each shoulder to wall , working on wt shift and trunk rotation without loss of balance Heel/toe rocks on airex in ll bars Standing for pillow case slides for/ back, side/ side mass practice Standing in llbars with back to wall for reaches posterior to targets behind her Standing high marches, 4 to 5 sec holds, pt holding hand rail L  Up/ down 6 step within ll bars, tends to have poor control requiring CGA and use of rail to descend off step.     07/23/23: Initially utilized supine pelvic traction, 50#, x 8 min, then pat c/o abdominal discomfort so we discontinued Supine for therapist assisted stretching lumbar region, alt piriformis stretch, also B knee to chest stretch with legs on therapists leg, manually assisted light lower trunk rotation  Side lying R for cross friction massage L SI, quadratus lumborum, R SI Pt sitting for forward roll outs with B hands on physioball for, and side to  side  Balance activities: standing in ll bars for reaching behind each shoulder to wall , working on wt shift and trunk rotation without loss of balance Heel/toe rocks on airex in ll bars Standing for pillow case slides for/ back, side/ side   Seated for forward flexion stretch of trunk with B hands on cane to replicate the forward physioball roll at home    07/21/23:  Assessed the pt today for her balance , with Berg and with FGA.  Also reassessed L hip ROM per flow chart above BERG 46/56 FGA 17/30  Side lying on R for cross friction massage over B post SI ligs, jt line, also L quadratus lumborum.  Supine for manual pelvic traction with sheet around pelvis, bouts of 60 sec, x 3 with some reported relief of LBP  Instructed in standing heel/toe rocks, mass practice to address balance/postural sway at home Instructed in standing high slow marches to improve single leg stability, mass practice  Inst to utilize at home to address her balance, near a sink or counter for hand hold  07/16/23  BP 190/82 HR 74,  178/80 2nd check  Supine stretches HS 2x30 seconds B  Piriformis stretch 2x30 seconds  Lumbar rotation stretch 12x5 seconds  Supine TA sets x10  SKTC stretch 5x10 seconds B     07/03/23 NuStep L4 x66mins Supine LE stretches w/moist heat- SKTC, HS, LTR, glutes, piriformis  Feet on pball rotations and knees to chest x10  PPT x10 Supine marching 20 reps alt  Ball squeeze 2x10  STS 2x10  Alt box tap 4   07/02/23:  Evaluation,  Utilized sheet around pelvis, in hook lying position for 3 bouts of static gentle pelvic traction by PT with some relief of pain  Instructed in therex as indicated below, also place moist head under back while performing the ex to improve jt stiffness Needed tactle cues for pelvic tilt and for overpressure with arms for alt knee to chest,                                                                                                                                   PATIENT EDUCATION:  Education details: POC, goals  Person educated: Patient Education method: Explanation, Demonstration, Tactile cues, Verbal cues, and Handouts Education comprehension: verbalized understanding  HOME EXERCISE PROGRAM: Access Code: [email or web address] URL: [email or web address] Date: 07/02/2023 Prepared by: Georganna Maxson  Exercises - Supine Lower Trunk Rotation  - 1 x daily - 7 x weekly - 3 sets - 10 reps - Supine Piriformis Stretch with Foot on Ground  - 1 x daily - 7 x weekly - 3 sets - 10 reps - Supine Posterior Pelvic Tilt  - 1 x daily - 7 x weekly - 3 sets - 10 reps  ASSESSMENT:  CLINICAL IMPRESSION:  The patient continued with physical therapy today to address lower back pain and stiffness as well as balance deficits.  We continued with gentle stretching and decompression B lumbar region, she did not c/o back pain during today's session. Progressed with additional postural strengthening activities as well.  Overall she is regaining her confidence with community outings and much reduced back stiffness.   EVAL-Patient is a 89 y.o. female who was evaluated today by physical therapy for recent onset of central lower back pain.  Her pain is consistent with joint stiffness. She has good LE strength, no neurologic Sx.  Very stiff throughout lumbar region.  Tolerated initial session of gentle stretching well.  She should benefit from skilled PT to address her pain and improve her sleeping patterns, movement efficiency.  OBJECTIVE IMPAIRMENTS: decreased activity tolerance, decreased balance, decreased knowledge of condition, hypomobility, impaired perceived functional ability, and pain.   ACTIVITY LIMITATIONS: carrying, lifting, bending, sitting, squatting, sleeping, transfers, and bed mobility  PARTICIPATION LIMITATIONS: meal prep, cleaning, laundry, driving, shopping, and community activity  PERSONAL FACTORS: Age, Behavior pattern, Past/current  experiences, Time since onset of injury/illness/exacerbation, and 1-2 comorbidities: HTN, h/o fall, osteopenia are also affecting patient's functional outcome.   REHAB POTENTIAL: Good  CLINICAL DECISION  MAKING: Evolving/moderate complexity  EVALUATION COMPLEXITY: Moderate   GOALS: Goals reviewed with patient? Yes  SHORT TERM GOALS: Target date: 2 weeks, 07/26/23  I HEP Baseline: Goal status: 08/04/23: in progress  LONG TERM GOALS: Target date: 12 weeks, 09/24/23  Modified Oswestry 58% improve to 38% Baseline:  Goal status: INITIAL  2.  Improve sleeping pattern, pt able to reduce intensity of pain when awakening by 50%  Baseline: 7 to8/10 pain  Goal status: INITIAL  3.  5 xsit to stand 14.8 sec or less Baseline:  Goal status: INITIAL  4.  Improve lumbar spine Rom all planes to 50 % or greater,  Baseline: 25% Goal status: INITIAL  5.  07/21/23:New goal: FGA improve from 17/30 to 25/30  6.  07/21/23: New goal: Berg improve from 46/56   PLAN:  PT FREQUENCY: 2x/week  PT DURATION: 12 weeks  PLANNED INTERVENTIONS: 97110-Therapeutic exercises, 97530- Therapeutic activity, 97112- Neuromuscular re-education, 97535- Self Care, and 02859- Manual therapy.  PLAN FOR NEXT SESSION: continue manual techniques for pain relief, and balance activities, ankle strategies, lateral movements  Jaire Pinkham PT, DPT, OCS 08/04/23 3:13 PM

## 2023-08-07 ENCOUNTER — Ambulatory Visit: Admitting: Physical Therapy

## 2023-08-07 ENCOUNTER — Encounter: Payer: Self-pay | Admitting: Physical Therapy

## 2023-08-07 DIAGNOSIS — R293 Abnormal posture: Secondary | ICD-10-CM | POA: Diagnosis not present

## 2023-08-07 DIAGNOSIS — M25512 Pain in left shoulder: Secondary | ICD-10-CM | POA: Diagnosis not present

## 2023-08-07 DIAGNOSIS — G8929 Other chronic pain: Secondary | ICD-10-CM | POA: Diagnosis not present

## 2023-08-07 DIAGNOSIS — M5459 Other low back pain: Secondary | ICD-10-CM | POA: Diagnosis not present

## 2023-08-07 DIAGNOSIS — M6281 Muscle weakness (generalized): Secondary | ICD-10-CM | POA: Diagnosis not present

## 2023-08-07 DIAGNOSIS — Z9181 History of falling: Secondary | ICD-10-CM | POA: Diagnosis not present

## 2023-08-07 NOTE — Therapy (Signed)
 OUTPATIENT PHYSICAL THERAPY THORACOLUMBAR TREATMENT   Patient Name: Jane Cox MRN: [id number] DOB:Nov 17, 1933, 89 y.o., female Today's Date: 08/07/2023  END OF SESSION:  PT End of Session - 08/07/23 1350     Visit Number 8    Date for PT Re-Evaluation 09/24/23    Authorization Type MCR/BCBS    Progress Note Due on Visit 10    PT Start Time 1342    PT Stop Time 1425    PT Time Calculation (min) 43 min    Activity Tolerance Patient tolerated treatment well    Behavior During Therapy [facility] for tasks assessed/performed                Past Medical History:  Diagnosis Date   Allergy    Arthritis    Basal cell carcinoma    HX: breast cancer    Hyperlipidemia    Hypertension    Osteopenia    Past Surgical History:  Procedure Laterality Date   ABDOMINAL HYSTERECTOMY  01/28/1981   BREAST SURGERY     mastectomy   CHOLECYSTECTOMY  01/29/1991   COLONOSCOPY     EYE SURGERY     VEIN SURGERY     Patient Active Problem List   Diagnosis Date Noted   Chronic rhinitis 03/27/2023   Other specified disorders of eustachian tube, bilateral 03/27/2023   Vitamin D  deficiency 03/05/2022   Ruptured Bakers cyst 01/13/2018   Skin cancer, basal cell 04/20/2015   Lumbar degenerative disc disease 05/23/2014   Arthritis of left hip 05/23/2014   Greater trochanteric bursitis of right hip 05/23/2014   Renal artery stenosis (HCC) 12/15/2013   Hyperlipidemia 09/10/2006   Essential hypertension 08/18/2006   Allergic rhinitis 08/18/2006   Osteopenia 08/18/2006   BREAST CANCER, HX OF 08/18/2006    PCP: Katrinka Senior, MD  REFERRING PROVIDER: Kennyth Bart, MD, for LBP, Morene Mace, DO for balance deficits  REFERRING DIAG: LS spine pain, h/o fall, balance deficits  Rationale for Evaluation and Treatment: Rehabilitation  THERAPY DIAG:  Muscle weakness (generalized)  Other low back pain  Abnormal posture  History of falling  ONSET DATE: 05/28/23  SUBJECTIVE:                                                                                                                                                                                            SUBJECTIVE STATEMENT:   My back is much better, feeling better all the time. Stretching has really been helpful, balance is coming along- a lot of it is fear of falling. MD increased my medicines for BP so its stabilized      EVAL--Fell  on the 3rd step of escalator, then developed lower back pain about 8 days later.  I have tried taking the medicine that they prescribed and it has helped although it upsets my stomach.  I am in pain anytime I am still, especially at night, I just cannot get comfortable  PERTINENT HISTORY:  Spent August until November of 2024 caring for her husband who was in hospice.  Normally doesn't have lower back pain, had bad fall at bottom of escalator and her clothes were caught in the belt and lacerations throughout Ue's. Referred to PT from sports medicine MD.   PAIN:  Are you having pain? No 0/10  PRECAUTIONS: Fall  RED FLAGS: None   WEIGHT BEARING RESTRICTIONS: No  FALLS:  Has patient fallen in last 6 months? Yes. Number of falls 1  LIVING ENVIRONMENT: Lives with: lives with their son Lives in: House/apartment Stairs: no Has following equipment at home: Environmental consultant - 2 wheeled  OCCUPATION: retired  PLOF: Independent  PATIENT GOALS: to manage this pain  NEXT MD VISIT: one month  OBJECTIVE:  Note: Objective measures were completed at Evaluation unless otherwise noted.  DIAGNOSTIC FINDINGS:  Arthritic changes primarily L5/S1, see imaging report in EPIC  PATIENT SURVEYS:  Modified Oswestry Modified Oswestry Low Back Pain Disability Questionnaire: 29 / 50 = 58.0 %   COGNITION: Overall cognitive status: Within functional limits for tasks assessed     SENSATION: WFL  MUSCLE LENGTH: Hamstrings: Right wfl deg; Left wfl deg Debby test: Right wfl deg; Left wfl  deg  POSTURE: some loss of lumbar lordosis, wider than normal base of support, flexed thoracic mild  PALPATION: Non tender with palpation B post hips, lumbar region  LUMBAR ROM:   AROM eval  Flexion 25p!  Extension 20p!  Right lateral flexion 25p!  Left lateral flexion 25  Right rotation   Left rotation    (Blank rows = not tested)  LOWER EXTREMITY ROM:     Passive  Right eval Left eval L 07/22/23  Hip flexion  110 p! Wfl, no p!  Hip extension     Hip abduction     Hip adduction     Hip internal rotation     Hip external rotation  20p! Wfl no p!  Knee flexion     Knee extension     Ankle dorsiflexion heel walk wfl wfl   Ankle plantarflexion toe walk wfl wfl   Ankle inversion     Ankle eversion      (Blank rows = wfl)  LOWER EXTREMITY MMT:  all MMT grossly wnl and non provocative LE's  MMT Right eval Left eval  Hip flexion    Hip extension    Hip abduction    Hip adduction    Hip internal rotation    Hip external rotation    Knee flexion    Knee extension    Ankle dorsiflexion    Ankle plantarflexion    Ankle inversion    Ankle eversion     (Blank rows = not tested)  LUMBAR SPECIAL TESTS:  Straight leg raise test: Negative  FUNCTIONAL TESTS:  5 times sit to stand: 34s, pain L hip  GAIT: Distance walked: in clinic no device, 90' no significant deficits noted  TREATMENT DATE:   08/07/23  Declined BP check- checking BP at home, its been OK   Nustep L4x8 minutes all four extremities, seat 6   One foot on belt of TM/other on ground 3x30 seconds B  Tandem  walk next to TM x3 laps forward only  Side steps on blue foam pad x4 laps min guard had some posterior unsteadiness  Forward step ups on blue foam pad x10 B Lateral step ups on blue foam pad x10 B Cross midline toe taps on solid surface x20 alternating  Lateral rocks on rocker board x2 minutes Forward/backward rocks on rocker board x2 min  Standing on blue foam pad EC 3x30 seconds     08/04/23:  Nustep L 4 x 6 min UE;s and LE's Supine for manual assisted stretching by PT for B knee to chest, also gentle rocking Supine with physioball under feet for B heel to chest  Supine with physioball LTR Supine with physioball bridging Seated forward rolls B hands on physioball Seated rows 10# x 10 reps Standing wall slides up wall with B hands to stretch spine into extension. Standing for pillow case slides under each foot, with light one hand support Resisted gait 10#, side stepping 5 reps each, pt required Min assist for R to L side stepping, CGA for L side stepping Forward step downs from 2 step with hand hold support, 10 x each leg Seated rows with yellow t band                                                                                                                                    PATIENT EDUCATION:  Education details: POC, goals  Person educated: Patient Education method: Explanation, Demonstration, Tactile cues, Verbal cues, and Handouts Education comprehension: verbalized understanding  HOME EXERCISE PROGRAM: Access Code: [email or web address] URL: [email or web address] Date: 07/02/2023 Prepared by: Amy Speaks  Exercises - Supine Lower Trunk Rotation  - 1 x daily - 7 x weekly - 3 sets - 10 reps - Supine Piriformis Stretch with Foot on Ground  - 1 x daily - 7 x weekly - 3 sets - 10 reps - Supine Posterior Pelvic Tilt  - 1 x daily - 7 x weekly - 3 sets - 10 reps  ASSESSMENT:  CLINICAL IMPRESSION:  Focused on balance training today, she reports her back is really feeling a lot better. Did well today, she politely declined BP check as her values have been WNL at home since MD adjusted medications. Still challenged by unsteady surfaces especially dynamically. Will continue to progress as able/tolerated.   EVAL-Patient is a 89 y.o. female who was evaluated today by physical therapy for recent onset of central lower back pain.  Her pain is consistent  with joint stiffness. She has good LE strength, no neurologic Sx.  Very stiff throughout lumbar region.  Tolerated initial session of gentle stretching well.  She should benefit from skilled PT to address her pain and improve her sleeping patterns, movement efficiency.  OBJECTIVE IMPAIRMENTS: decreased activity tolerance, decreased balance, decreased knowledge of condition, hypomobility, impaired perceived functional ability, and pain.   ACTIVITY LIMITATIONS: carrying, lifting, bending, sitting, squatting, sleeping, transfers, and bed  mobility  PARTICIPATION LIMITATIONS: meal prep, cleaning, laundry, driving, shopping, and community activity  PERSONAL FACTORS: Age, Behavior pattern, Past/current experiences, Time since onset of injury/illness/exacerbation, and 1-2 comorbidities: HTN, h/o fall, osteopenia are also affecting patient's functional outcome.   REHAB POTENTIAL: Good  CLINICAL DECISION MAKING: Evolving/moderate complexity  EVALUATION COMPLEXITY: Moderate   GOALS: Goals reviewed with patient? Yes  SHORT TERM GOALS: Target date: 2 weeks, 07/26/23  I HEP Baseline: Goal status: 08/04/23: in progress  LONG TERM GOALS: Target date: 12 weeks, 09/24/23  Modified Oswestry 58% improve to 38% Baseline:  Goal status: INITIAL  2.  Improve sleeping pattern, pt able to reduce intensity of pain when awakening by 50%  Baseline: 7 to8/10 pain  Goal status: INITIAL  3.  5 xsit to stand 14.8 sec or less Baseline:  Goal status: INITIAL  4.  Improve lumbar spine Rom all planes to 50 % or greater,  Baseline: 25% Goal status: INITIAL  5.  07/21/23:New goal: FGA improve from 17/30 to 25/30  6.  07/21/23: New goal: Berg improve from 46/56   PLAN:  PT FREQUENCY: 2x/week  PT DURATION: 12 weeks  PLANNED INTERVENTIONS: 97110-Therapeutic exercises, 97530- Therapeutic activity, 97112- Neuromuscular re-education, 97535- Self Care, and 02859- Manual therapy.  PLAN FOR NEXT SESSION: continue  manual techniques for pain relief, and balance activities, ankle strategies, lateral movements, HEP progressions PRN   Josette Rough, PT, DPT 08/07/23 2:26 PM

## 2023-08-11 ENCOUNTER — Ambulatory Visit: Admitting: Physical Therapy

## 2023-08-13 ENCOUNTER — Ambulatory Visit

## 2023-08-15 DIAGNOSIS — R0981 Nasal congestion: Secondary | ICD-10-CM | POA: Diagnosis not present

## 2023-08-15 DIAGNOSIS — R42 Dizziness and giddiness: Secondary | ICD-10-CM | POA: Diagnosis not present

## 2023-08-15 DIAGNOSIS — H9203 Otalgia, bilateral: Secondary | ICD-10-CM | POA: Diagnosis not present

## 2023-08-20 ENCOUNTER — Ambulatory Visit

## 2023-08-21 DIAGNOSIS — N6321 Unspecified lump in the left breast, upper outer quadrant: Secondary | ICD-10-CM | POA: Diagnosis not present

## 2023-08-22 ENCOUNTER — Encounter: Payer: Self-pay | Admitting: Family Medicine

## 2023-08-25 ENCOUNTER — Ambulatory Visit

## 2023-08-25 ENCOUNTER — Other Ambulatory Visit: Payer: Self-pay | Admitting: Family Medicine

## 2023-08-25 DIAGNOSIS — R42 Dizziness and giddiness: Secondary | ICD-10-CM

## 2023-08-27 ENCOUNTER — Ambulatory Visit

## 2023-08-29 DIAGNOSIS — N6321 Unspecified lump in the left breast, upper outer quadrant: Secondary | ICD-10-CM | POA: Diagnosis not present

## 2023-08-29 DIAGNOSIS — C50412 Malignant neoplasm of upper-outer quadrant of left female breast: Secondary | ICD-10-CM | POA: Diagnosis not present

## 2023-09-01 ENCOUNTER — Ambulatory Visit (INDEPENDENT_AMBULATORY_CARE_PROVIDER_SITE_OTHER): Payer: Medicare Other | Admitting: Family Medicine

## 2023-09-01 ENCOUNTER — Encounter: Payer: Self-pay | Admitting: Family Medicine

## 2023-09-01 VITALS — BP 138/57 | HR 69 | Temp 97.3°F | Ht 61.0 in | Wt 138.6 lb

## 2023-09-01 DIAGNOSIS — I10 Essential (primary) hypertension: Secondary | ICD-10-CM | POA: Diagnosis not present

## 2023-09-01 DIAGNOSIS — E785 Hyperlipidemia, unspecified: Secondary | ICD-10-CM

## 2023-09-01 DIAGNOSIS — H9193 Unspecified hearing loss, bilateral: Secondary | ICD-10-CM

## 2023-09-01 MED ORDER — AMLODIPINE BESYLATE 5 MG PO TABS: 7.5000 mg | ORAL_TABLET | Freq: Every day | ORAL | 3 refills | Status: AC

## 2023-09-01 NOTE — Patient Instructions (Addendum)
 Thank you for doing home blood pressure checks- looks reasonable enough- continue current medications   We have placed a referral for you today to [facility]- please call their # if you do not hear within a week (may be listed below or you may see mychart message within a few days with #).  -if new or recurrent issues please let me know as soon as possible especially persistent vertigo may need ER trip   Recommended follow up: Return in about 6 months (around 03/03/2024) for followup or sooner if needed.Schedule b4 you leave.

## 2023-09-01 NOTE — Progress Notes (Signed)
 Phone [phone] In person visit   Subjective:   Jane Cox is a 89 y.o. year old very pleasant female patient who presents for/with See problem oriented charting Chief Complaint  Patient presents with   Medical Management of Chronic Issues   Hypertension    Pt c/o dizzy spells that started on 07/18 went and got evaluated at [facility].   Hyperlipidemia    Past Medical History-  Patient Active Problem List   Diagnosis Date Noted   Vitamin D  deficiency 03/05/2022    Priority: Medium    Renal artery stenosis (HCC) 12/15/2013    Priority: Medium    Hyperlipidemia 09/10/2006    Priority: Medium    Essential hypertension 08/18/2006    Priority: Medium    BREAST CANCER, HX OF 08/18/2006    Priority: Medium    Chronic rhinitis 03/27/2023    Priority: Low   Other specified disorders of eustachian tube, bilateral 03/27/2023    Priority: Low   Ruptured Bakers cyst 01/13/2018    Priority: Low   Skin cancer, basal cell 04/20/2015    Priority: Low   Lumbar degenerative disc disease 05/23/2014    Priority: Low   Arthritis of left hip 05/23/2014    Priority: Low   Greater trochanteric bursitis of right hip 05/23/2014    Priority: Low   Allergic rhinitis 08/18/2006    Priority: Low   Osteopenia 08/18/2006    Priority: Low    Medications- reviewed and updated Current Outpatient Medications  Medication Sig Dispense Refill   acetaminophen  (TYLENOL ) 650 MG CR tablet Take 650 mg by mouth every 8 (eight) hours as needed. Reported on 02/27/2015     atorvastatin  (LIPITOR) 20 MG tablet TAKE 1/2 TABLET BY MOUTH DAILY 45 tablet 1   hydrochlorothiazide  (MICROZIDE ) 12.5 MG capsule TAKE 1 CAPSULE BY MOUTH EVERY DAY 90 capsule 1   labetalol  (NORMODYNE ) 100 MG tablet TAKE 1 TABLET BY MOUTH TWICE A DAY 180 tablet 3   lisinopril  (ZESTRIL ) 40 MG tablet TAKE 1 TABLET BY MOUTH EVERY DAY 90 tablet 1   amLODipine  (NORVASC ) 5 MG tablet Take 1.5 tablets (7.5 mg total) by mouth daily. 135 tablet 3    No current facility-administered medications for this visit.     Objective:  BP (!) 138/57 Comment: most recent home reading  Pulse 69   Temp (!) 97.3 F (36.3 C)   Ht 5' 1 (1.549 m)   Wt 138 lb 9.6 oz (62.9 kg)   SpO2 97%   BMI 26.19 kg/m  Gen: NAD, resting comfortably Tympanic membrane normal bilaterally as are ear canals- no apparent effusion CV: RRR no murmurs rubs or gallops Lungs: CTAB no crackles, wheeze, rhonchi Ext: trace edema Skin: warm, dry    Assessment and Plan   # Urgent care follow-up-patient reports urgent care visit on July 18 for dizziness although sounds more like vertigo given spinning sensation-she was told to trial scopolamine patches and prednisone .  She had EKG with bradycardia but otherwise reassuring per notes. BP was not orthostatic. She also reported hearing issues.  She requested on 08/22/2023 to be referred to ENT and this was placed for her but apparently, was unable to see her until November.  We placed a new referral today to [facility] to see if we can get in any faster- that is where her audiologist is.  - the week after she had dizziness episodes the prior Friday- had to be really careful getting up and down but has felt much better.  Symptoms were positional and improved with staying still . Noted hearing loss but in both ears. No new onset tinnitus. Does feel stopped up.  - no vertigo episodes in over a weekbut hearing loss is worse than usual- refer to ENT as above. She is wearing her normal hearing aids.   # Breast biopsy completed August 29, 2022 with [facility]- waiting on results with her history of breast cancer  #hypertension with white coat element S: medication: Amlodipine  7.5 mg, hydrochlorothiazide  12.5 mg, labetalol  twice daily, lisinopril  40 mg Home readings #s: #s typically in 130s 50's since increasing amlodipine  to 7.5 mg and no increased edema A/P: much improved- continue current medications   #hyperlipidemia S:  Medication:Atorvastatin  20 mg-takes half tablet daily Lab Results  Component Value Date   CHOL 136 02/25/2023   HDL 53.70 02/25/2023   LDLCALC 40 02/25/2023   LDLDIRECT 59.0 03/05/2022   TRIG 210.0 (H) 02/25/2023   CHOLHDL 3 02/25/2023  A/P: reasonable control- continue current medications  with kneon renal artery stenosis and celiac and SMA stenosis  Recommended follow up: Return in about 6 months (around 03/03/2024) for followup or sooner if needed.Schedule b4 you leave. Future Appointments  Date Time Provider Department Center  09/19/2023  1:00 PM [facility]  09/19/2023  2:20 PM Pearline Norman RAMAN, MD [facility]  12/24/2023  1:30 PM Karis Clunes, MD [facility] None    Lab/Order associations:   ICD-10-CM   1. Bilateral hearing loss, unspecified hearing loss type  H91.93 Ambulatory referral to ENT    2. Essential hypertension  I10     3. Hyperlipidemia, unspecified hyperlipidemia type  E78.5       Meds ordered this encounter  Medications   amLODipine  (NORVASC ) 5 MG tablet    Sig: Take 1.5 tablets (7.5 mg total) by mouth daily.    Dispense:  135 tablet    Refill:  3    Return precautions advised.  Garnette Lukes, MD

## 2023-09-03 ENCOUNTER — Telehealth: Payer: Self-pay | Admitting: *Deleted

## 2023-09-03 NOTE — Telephone Encounter (Signed)
 Spoke to patient to confirm upcoming afternoon [facility] clinic appointment on 8/13, paperwork will be sent via [facility].  Gave location and time, also informed patient that the surgeon's office would be calling as well to get information from them similar to the packet that they will be receiving so make sure to do both.  Reminded patient that all providers will be coming to the clinic to see them HERE and if they had any questions to not hesitate to reach back out to myself or their navigators.

## 2023-09-08 ENCOUNTER — Encounter: Payer: Self-pay | Admitting: *Deleted

## 2023-09-08 ENCOUNTER — Other Ambulatory Visit: Payer: Self-pay | Admitting: *Deleted

## 2023-09-08 DIAGNOSIS — Z170 Estrogen receptor positive status [ER+]: Secondary | ICD-10-CM | POA: Insufficient documentation

## 2023-09-10 ENCOUNTER — Inpatient Hospital Stay: Admitting: Genetic Counselor

## 2023-09-10 ENCOUNTER — Inpatient Hospital Stay: Attending: Hematology

## 2023-09-10 ENCOUNTER — Encounter: Payer: Self-pay | Admitting: Genetic Counselor

## 2023-09-10 ENCOUNTER — Encounter: Payer: Self-pay | Admitting: *Deleted

## 2023-09-10 ENCOUNTER — Ambulatory Visit: Payer: Self-pay | Admitting: General Surgery

## 2023-09-10 ENCOUNTER — Inpatient Hospital Stay: Admitting: Hematology

## 2023-09-10 ENCOUNTER — Ambulatory Visit
Admission: RE | Admit: 2023-09-10 | Discharge: 2023-09-10 | Disposition: A | Discharge status: Home / Self Care | Source: Ambulatory Visit | Attending: Radiation Oncology | Admitting: Radiation Oncology

## 2023-09-10 VITALS — BP 174/54 | HR 63 | Resp 20 | Ht 61.0 in | Wt 141.0 lb

## 2023-09-10 DIAGNOSIS — E538 Deficiency of other specified B group vitamins: Secondary | ICD-10-CM | POA: Diagnosis not present

## 2023-09-10 DIAGNOSIS — M8580 Other specified disorders of bone density and structure, unspecified site: Secondary | ICD-10-CM | POA: Insufficient documentation

## 2023-09-10 DIAGNOSIS — Z170 Estrogen receptor positive status [ER+]: Secondary | ICD-10-CM

## 2023-09-10 DIAGNOSIS — Z853 Personal history of malignant neoplasm of breast: Secondary | ICD-10-CM | POA: Diagnosis not present

## 2023-09-10 DIAGNOSIS — D649 Anemia, unspecified: Secondary | ICD-10-CM

## 2023-09-10 DIAGNOSIS — C50412 Malignant neoplasm of upper-outer quadrant of left female breast: Secondary | ICD-10-CM

## 2023-09-10 DIAGNOSIS — Z800 Family history of malignant neoplasm of digestive organs: Secondary | ICD-10-CM | POA: Insufficient documentation

## 2023-09-10 DIAGNOSIS — Z803 Family history of malignant neoplasm of breast: Secondary | ICD-10-CM | POA: Diagnosis not present

## 2023-09-10 LAB — CMP (CANCER CENTER ONLY)
ALT: 13 U/L (ref 0–44)
AST: 13 U/L — ABNORMAL LOW (ref 15–41)
Albumin: 4.5 g/dL (ref 3.5–5.0)
Alkaline Phosphatase: 132 U/L — ABNORMAL HIGH (ref 38–126)
Anion gap: 6 (ref 5–15)
BUN: 13 mg/dL (ref 8–23)
CO2: 28 mmol/L (ref 22–32)
Calcium: 10 mg/dL (ref 8.9–10.3)
Chloride: 103 mmol/L (ref 98–111)
Creatinine: 0.81 mg/dL (ref 0.44–1.00)
GFR, Estimated: >60 mL/min (ref >60)
Glucose, Bld: 96 mg/dL (ref 70–99)
Potassium: 4.2 mmol/L (ref 3.5–5.1)
Sodium: 137 mmol/L (ref 135–145)
Total Bilirubin: 0.7 mg/dL (ref 0.0–1.2)
Total Protein: 6.7 g/dL (ref 6.5–8.1)

## 2023-09-10 LAB — CBC WITH DIFFERENTIAL (CANCER CENTER ONLY)
Abs Immature Granulocytes: 0.03 K/uL (ref 0.00–0.07)
Basophils Absolute: 0 K/uL (ref 0.0–0.1)
Basophils Relative: 0 %
Eosinophils Absolute: 0.1 K/uL (ref 0.0–0.5)
Eosinophils Relative: 1 %
HCT: 33.3 % — ABNORMAL LOW (ref 36.0–46.0)
Hemoglobin: 11 g/dL — ABNORMAL LOW (ref 12.0–15.0)
Immature Granulocytes: 0 %
Lymphocytes Relative: 22 %
Lymphs Abs: 1.9 K/uL (ref 0.7–4.0)
MCH: 27.6 pg (ref 26.0–34.0)
MCHC: 33 g/dL (ref 30.0–36.0)
MCV: 83.7 fL (ref 80.0–100.0)
Monocytes Absolute: 0.7 K/uL (ref 0.1–1.0)
Monocytes Relative: 8 %
Neutro Abs: 5.9 K/uL (ref 1.7–7.7)
Neutrophils Relative %: 69 %
Platelet Count: 320 K/uL (ref 150–400)
RBC: 3.98 MIL/uL (ref 3.87–5.11)
RDW: 15.1 % (ref 11.5–15.5)
WBC Count: 8.6 K/uL (ref 4.0–10.5)
nRBC: 0 % (ref 0.0–0.2)

## 2023-09-10 LAB — GENETIC SCREENING ORDER

## 2023-09-10 NOTE — Progress Notes (Signed)
 REFERRING PROVIDER: Lanny Callander, MD [address] [place],  [place] [address]  PRIMARY PROVIDER:  Katrinka Garnette KIDD, MD  PRIMARY REASON FOR VISIT:  1. Family history of breast cancer   2. Family history of colon cancer   3. BREAST CANCER, HX OF   4. Malignant neoplasm of upper-outer quadrant of left breast in female, estrogen receptor positive (HCC)      HISTORY OF PRESENT ILLNESS:   Jane Cox, a 89 y.o. female, was seen for a [place] cancer genetics consultation at the request of Dr. Lanny due to a personal and family history of breast cancer.  Jane Cox presents to clinic today to discuss the possibility of a hereditary predisposition to cancer, genetic testing, and to further clarify her future cancer risks, as well as potential cancer risks for family members.   In 1980, at the age of 10, Jane Cox was diagnosed with right breast cancer.  She has again been diagnosed with breast cancer in the left breast..      CANCER HISTORY:  Oncology History  Malignant neoplasm of upper-outer quadrant of left breast in female, estrogen receptor positive (HCC)  09/08/2023 Initial Diagnosis   Malignant neoplasm of upper-outer quadrant of left breast in female, estrogen receptor positive (HCC)   09/09/2023 Cancer Staging   Staging form: Breast, AJCC 8th Edition - Clinical: Stage IA (cT1c, cN0, cM0, G1, ER+, PR+, HER2-) - Signed by Lanny Callander, MD on 09/09/2023 Histologic grading system: 3 grade system      RISK FACTORS:  Menarche was at age 18.  First live birth at age 54.  Hysterectomy: yes.  Menopausal status: postmenopausal.   Past Medical History:  Diagnosis Date   Allergy    Arthritis    Basal cell carcinoma    Family history of breast cancer    Family history of breast cancer    Family history of colon cancer    HX: breast cancer    Hyperlipidemia    Hypertension    Osteopenia     Past Surgical History:  Procedure Laterality Date   ABDOMINAL HYSTERECTOMY   01/28/1981   BREAST SURGERY Right 1980   mastectomy   CHOLECYSTECTOMY  01/29/1991   COLONOSCOPY     EYE SURGERY     VEIN SURGERY      Social History   Socioeconomic History   Marital status: Married    Spouse name: Not on file   Number of children: 1   Years of education: Not on file   Highest education level: Master's degree (e.g., MA, MS, MEng, MEd, MSW, MBA)  Occupational History   Not on file  Tobacco Use   Smoking status: Never   Smokeless tobacco: Never  Vaping Use   Vaping status: Never Used  Substance and Sexual Activity   Alcohol use: Not Currently    Alcohol/week: 1.0 standard drink of alcohol    Types: 1 drink(s) per week   Drug use: No   Sexual activity: Not on file  Other Topics Concern   Not on file  Social History Narrative   Married. Husband ill ([facility] pt- prostate cancer 2009-radiation/seed implant with colitis-worsening. Colitis, CMV, bronchitis and flu in feb 2015-SNF afterwards and came home 1 month later with op PT)      1 son-bachelor (patient here). No grandchildren.       Hobbies: ladies clubs previously before being home with husband, avid reader, doesn't enjoy outdoors.       Advanced Directives: Husband HCPOA.  Plans for resuscitation.    Social Drivers of Corporate investment banker Strain: Low Risk  (08/29/2023)   Overall Financial Resource Strain (CARDIA)    Difficulty of Paying Living Expenses: Not hard at all  Food Insecurity: No Food Insecurity (08/29/2023)   Hunger Vital Sign    Worried About Running Out of Food in the Last Year: Never true    Ran Out of Food in the Last Year: Never true  Transportation Needs: No Transportation Needs (08/29/2023)   PRAPARE - Administrator, Civil Service (Medical): No    Lack of Transportation (Non-Medical): No  Physical Activity: Inactive (08/29/2023)   Exercise Vital Sign    Days of Exercise per Week: 0 days    Minutes of Exercise per Session: Not on file  Stress: No Stress Concern  Present (08/29/2023)   [employer] of Occupational Health - Occupational Stress Questionnaire    Feeling of Stress: Not at all  Social Connections: Moderately Isolated (08/29/2023)   Social Connection and Isolation Panel    Frequency of Communication with Friends and Family: Once a week    Frequency of Social Gatherings with Friends and Family: Once a week    Attends Religious Services: More than 4 times per year    Active Member of [employer] or Organizations: Yes    Attends Banker Meetings: 1 to 4 times per year    Marital Status: Widowed     FAMILY HISTORY:  We obtained a detailed, 4-generation family history.  Significant diagnoses are listed below: Family History  Problem Relation Age of Onset   Lung cancer Father    Breast cancer Maternal Aunt        dx. late 50s-60s   Colon cancer Maternal Grandfather 52     The patient has a son who is cancer free.  Her siblings are not reported to have cancer.  Her parents are deceased.  The patient's mother had three sisters, one had breast cancer in her 59's.  The maternal grandfather had colon cancer.  The patient's father had lung cancer and was a heavy smoker.  He had lung cancer.  There is no other reported family history of cancer.  Jane Cox is unaware of previous family history of genetic testing for hereditary cancer risks. There is no reported Ashkenazi Jewish ancestry. There is no known consanguinity.  GENETIC COUNSELING ASSESSMENT: Jane Cox is a 89 y.o. female with a personal and family history of breast cancer which is somewhat suggestive of a hereditary cancer syndrome and predisposition to cancer given her young age of onest. We, therefore, discussed and recommended the following at today's visit.   DISCUSSION: We discussed that, in general, most cancer is not inherited in families, but instead is sporadic or familial. Sporadic cancers occur by chance and typically happen at older ages (>50 years) as this type  of cancer is caused by genetic changes acquired during an individual's lifetime. Some families have more cancers than would be expected by chance; however, the ages or types of cancer are not consistent with a known genetic mutation or known genetic mutations have been ruled out. This type of familial cancer is thought to be due to a combination of multiple genetic, environmental, hormonal, and lifestyle factors. While this combination of factors likely increases the risk of cancer, the exact source of this risk is not currently identifiable or testable.  We discussed that 5 - 10% of breast cancer is hereditary, with most cases associated  with BRCA mutations.  There are other genes that can be associated with hereditary breast cancer syndromes.  These include ATM, CHEK2 and PALB2.  We discussed that testing is beneficial for several reasons including knowing how to follow individuals after completing their treatment, identifying whether potential treatment options such as PARP inhibitors would be beneficial, and understand if other family members could be at risk for cancer and allow them to undergo genetic testing.   We reviewed the characteristics, features and inheritance patterns of hereditary cancer syndromes. We also discussed genetic testing, including the appropriate family members to test, the process of testing, insurance coverage and turn-around-time for results. We discussed the implications of a negative, positive, carrier and/or variant of uncertain significant result. Jane Cox  was offered a common hereditary cancer panel (36+ genes) and an expanded pan-cancer panel (70+ genes). Jane Cox was informed of the benefits and limitations of each panel, including that expanded pan-cancer panels contain genes that do not have clear management guidelines at this point in time.  We also discussed that as the number of genes included on a panel increases, the chances of variants of uncertain significance  increases. Jane Cox decided to pursue genetic testing for the CancerNext-Expanded+RNAinsight gene panel.   The CancerNext-Expanded gene panel offered by [facility] and includes sequencing, rearrangement, and RNA analysis for the following 77 genes: AIP, ALK, APC, ATM, BAP1, BARD1, BMPR1A, BRCA1, BRCA2, BRIP1, CDC73, CDH1, CDK4, CDKN1B, CDKN2A, CEBPA, CHEK2, CTNNA1, DDX41, DICER1, ETV6, FH, FLCN, GATA2, LZTR1, MAX, MBD4, MEN1, MET, MLH1, MSH2, MSH3, MSH6, MUTYH, NF1, NF2, NTHL1, PALB2, PHOX2B, PMS2, POT1, PRKAR1A, PTCH1, PTEN, RAD51C, RAD51D, RB1, RET, RPS20, RUNX1, SDHA, SDHAF2, SDHB, SDHC, SDHD, SMAD4, SMARCA4, SMARCB1, SMARCE1, STK11, SUFU, TMEM127, TP53, TSC1, TSC2, VHL, and WT1 (sequencing and deletion/duplication); AXIN2, CTNNA1, DDX41, EGFR, HOXB13, KIT, MBD4, MITF, MSH3, PDGFRA, POLD1 and POLE (sequencing only); EPCAM and GREM1 (deletion/duplication only). RNA data is routinely analyzed for use in variant interpretation for all genes.   Based on Jane Cox's personal and family history of cancer, she meets medical criteria for genetic testing. Despite that she meets criteria, she may still have an out of pocket cost. We discussed that if her out of pocket cost for testing is over $100, the laboratory will call and confirm whether she wants to proceed with testing.  If the out of pocket cost of testing is less than $100 she will be billed by the genetic testing laboratory.   We discussed that some people do not want to undergo genetic testing due to fear of genetic discrimination.  The Genetic Information Nondiscrimination Act (GINA) was signed into federal law in 2008. GINA prohibits health insurers and most employers from discriminating against individuals based on genetic information (including the results of genetic tests and family history information). According to GINA, health insurance companies cannot consider genetic information to be a preexisting condition, nor can they use it to make  decisions regarding coverage or rates. GINA also makes it illegal for most employers to use genetic information in making decisions about hiring, firing, promotion, or terms of employment. It is important to note that GINA does not offer protections for life insurance, disability insurance, or long-term care insurance. GINA does not apply to those in the [employer], those who work for companies with less than 15 employees, and new life insurance or long-term disability insurance policies.  Health status due to a cancer diagnosis is not protected under GINA. More information about GINA can be found by visiting  [employer].  PLAN: After considering the risks, benefits, and limitations, Jane Cox provided informed consent to pursue genetic testing and the blood sample was sent to [facility] for analysis of the CancerNext-Expanded+RNAinsight panel   Results should be available within approximately 2-3 weeks' time, [facility] testing will come back a bit earlier, at which point they will be disclosed by telephone to Jane Cox, as will any additional recommendations warranted by these results. Jane Cox will receive a summary of her genetic counseling visit and a copy of her results once available. This information will also be available in Epic.   Lastly, we encouraged Jane Cox to remain in contact with cancer genetics annually so that we can continuously update the family history and inform her of any changes in cancer genetics and testing that may be of benefit for this family.   Jane Cox questions were answered to her satisfaction today. Our contact information was provided should additional questions or concerns arise. Thank you for the referral and allowing us  to share in the care of your patient.   Reneshia Zuccaro P. Perri, MS, CGC Licensed, Patent attorney [email or web address] phone: [phone]  In total, 40 minutes were spent on the date of the encounter in  service to the patient including preparation, face-to-face consultation, documentation and care coordination.  The patient brought her son. Drs. Lanny Stalls, and/or Gudena were available for questions, if needed..    _______________________________________________________________________ For Office Staff:  Number of people involved in session: 1 Was an Intern/ student involved with case: no

## 2023-09-10 NOTE — Research (Signed)
 Trial:  [employer] 2021-05 - Specimen Collection Study to Evaluate Biomarkers in Subjects with Cancer   Patient Jane Cox was identified by Dr. Lanny as a potential candidate for the above listed study.  This Clinical Research Nurse met with [name], [id number], on 09/10/23 in a manner and location that ensures patient privacy to discuss participation in the above listed research study.  Patient is Accompanied by her son, [name].  A copy of the informed consent document and separate HIPAA Authorization was provided to the patient.  Patient reads, speaks, and understands [place].   Approximately 5 minutes were spent with the patient reviewing the informed consent documents.  Patient was provided the option of taking informed consent documents home to review and was encouraged to review at their convenience with their support network, including other care providers. Patient is comfortable with making a decision regarding study participation today.  Patient declined participation and did not offer a reason. She was thanked for her time.  Cherylyn Hoard, BSN, RN, [employer] Research Nurse II [phone] 09/10/2023 2:26 PM

## 2023-09-10 NOTE — Progress Notes (Signed)
 Radiation Oncology         [phone] ________________________________  Multidisciplinary Breast Oncology Clinic [facility]) Initial Outpatient Consultation  Name: Jane Cox MRN: [id number]  Date: 09/10/2023  DOB: [birth year]  RR:Ylwuzm, Garnette KIDD, MD  Curvin Deward MOULD, MD   REFERRING PHYSICIAN: Curvin Deward III, MD  DIAGNOSIS: The encounter diagnosis was Malignant neoplasm of upper-outer quadrant of left breast in female, estrogen receptor positive (HCC).  Stage IA (cT1c, cN0, cM0) Left Breast UOQ, Invasive Ductal Carcinoma, ER+ / PR+ / Her2-, Grade 1    ICD-10-CM   1. Malignant neoplasm of upper-outer quadrant of left breast in female, estrogen receptor positive (HCC)  C50.412    Z17.0       HISTORY OF PRESENT ILLNESS::Jane Cox is a 89 y.o. female who is presenting to the office today for evaluation of her newly diagnosed breast cancer. She is accompanied by her son. She is doing well overall. She does have a prior history of right breast cancer in 1980 that was treated with a mastectomy.   She had routine screening mammography on 06/04/23 showing a possible abnormality in the left breast. She later underwent a left breast ultrasound at [facility] on 08/21/23 which demonstrated a 1.7 cm irregular mass in 1 o'clock anterior left breast suspicious for malignancy, located 3 cmfn.  Biopsy of the 1 o'clock left breast mass on 08/29/23 showed: grade 1 invasive ductal carcinoma measuring 3 mm in the greatest linear extent of the sample. Prognostic indicators significant for: estrogen receptor, 100% positive and progesterone receptor, 90% positive, both with strong staining intensity. Proliferation marker Ki67 at 5%. HER2 negative.  Menarche: 89 years old Age at first live birth: 89 years old GP: 1 LMP: In June 1983 after her hysterectomy  Contraceptive: never used HRT: never used    The patient was referred today for presentation in the multidisciplinary conference.  Radiology studies  and pathology slides were presented there for review and discussion of treatment options.  A consensus was discussed regarding potential next steps.  PREVIOUS RADIATION THERAPY: No  PAST MEDICAL HISTORY:  Past Medical History:  Diagnosis Date   Allergy    Arthritis    Basal cell carcinoma    Family history of breast cancer    Family history of breast cancer    Family history of colon cancer    HX: breast cancer    Hyperlipidemia    Hypertension    Osteopenia     PAST SURGICAL HISTORY: Past Surgical History:  Procedure Laterality Date   ABDOMINAL HYSTERECTOMY  01/28/1981   BREAST SURGERY Right 1980   mastectomy   CHOLECYSTECTOMY  01/29/1991   COLONOSCOPY     EYE SURGERY     VEIN SURGERY      FAMILY HISTORY:  Family History  Problem Relation Age of Onset   Lung cancer Father    Breast cancer Maternal Aunt        dx. late 50s-60s   Colon cancer Maternal Grandfather 82    SOCIAL HISTORY:  Social History   Socioeconomic History   Marital status: Married    Spouse name: Not on file   Number of children: 1   Years of education: Not on file   Highest education level: Master's degree (e.g., MA, MS, MEng, MEd, MSW, MBA)  Occupational History   Not on file  Tobacco Use   Smoking status: Never   Smokeless tobacco: Never  Vaping Use   Vaping status: Never Used  Substance and  Sexual Activity   Alcohol use: Not Currently    Alcohol/week: 1.0 standard drink of alcohol    Types: 1 drink(s) per week   Drug use: No   Sexual activity: Not on file  Other Topics Concern   Not on file  Social History Narrative   Married. Husband ill ([facility] pt- prostate cancer 2009-radiation/seed implant with colitis-worsening. Colitis, CMV, bronchitis and flu in feb 2015-SNF afterwards and came home 1 month later with op PT)      1 son-bachelor (patient here). No grandchildren.       Hobbies: ladies clubs previously before being home with husband, avid reader, doesn't enjoy  outdoors.       Advanced Directives: Husband HCPOA. Plans for resuscitation.    Social Drivers of Corporate investment banker Strain: Low Risk  (08/29/2023)   Overall Financial Resource Strain (CARDIA)    Difficulty of Paying Living Expenses: Not hard at all  Food Insecurity: No Food Insecurity (08/29/2023)   Hunger Vital Sign    Worried About Running Out of Food in the Last Year: Never true    Ran Out of Food in the Last Year: Never true  Transportation Needs: No Transportation Needs (08/29/2023)   PRAPARE - Administrator, Civil Service (Medical): No    Lack of Transportation (Non-Medical): No  Physical Activity: Inactive (08/29/2023)   Exercise Vital Sign    Days of Exercise per Week: 0 days    Minutes of Exercise per Session: Not on file  Stress: No Stress Concern Present (08/29/2023)   [employer] of Occupational Health - Occupational Stress Questionnaire    Feeling of Stress: Not at all  Social Connections: Moderately Isolated (08/29/2023)   Social Connection and Isolation Panel    Frequency of Communication with Friends and Family: Once a week    Frequency of Social Gatherings with Friends and Family: Once a week    Attends Religious Services: More than 4 times per year    Active Member of [employer] or Organizations: Yes    Attends Banker Meetings: 1 to 4 times per year    Marital Status: Widowed    ALLERGIES:  Allergies  Allergen Reactions   Bacitracin-Polymyxin B     REACTION: rash   Shellfish Allergy Nausea And Vomiting   Neosporin [Neomycin-Polymyxin-Gramicidin] Rash    Also gets blisters    MEDICATIONS:  Current Outpatient Medications  Medication Sig Dispense Refill   acetaminophen  (TYLENOL ) 650 MG CR tablet Take 650 mg by mouth every 8 (eight) hours as needed. Reported on 02/27/2015     amLODipine  (NORVASC ) 5 MG tablet Take 1.5 tablets (7.5 mg total) by mouth daily. 135 tablet 3   atorvastatin  (LIPITOR) 20 MG tablet TAKE 1/2 TABLET BY MOUTH  DAILY 45 tablet 1   hydrochlorothiazide  (MICROZIDE ) 12.5 MG capsule TAKE 1 CAPSULE BY MOUTH EVERY DAY 90 capsule 1   labetalol  (NORMODYNE ) 100 MG tablet TAKE 1 TABLET BY MOUTH TWICE A DAY 180 tablet 3   lisinopril  (ZESTRIL ) 40 MG tablet TAKE 1 TABLET BY MOUTH EVERY DAY 90 tablet 1   No current facility-administered medications for this encounter.    REVIEW OF SYSTEMS: A 10+ POINT REVIEW OF SYSTEMS WAS OBTAINED including neurology, dermatology, psychiatry, cardiac, respiratory, lymph, extremities, GI, GU, musculoskeletal, constitutional, reproductive, HEENT. On the provided form, she reports hearing loss. She denies any other symptoms.    PHYSICAL EXAM:    09/10/2023  Vitals with BMI   Height 5' 1  Weight 141 lbs   BMI 26.66   Systolic 174 !   Systolic 180 !   Diastolic 54 !   Diastolic 62 !   Pulse 63     Legend: ! Abnormal  Lungs are clear to auscultation bilaterally. Heart has regular rate and rhythm. No palpable cervical, supraclavicular, or axillary adenopathy. Abdomen soft, non-tender, normal bowel sounds. Breast: Right breast with prior mastectomy and reconstructive surgery performed in 1980 with a silicone implant in place. Right breast is otherwise without palpable mass, nipple discharge, or bleeding. Left breast with biopsy site present in upper outer aspect of the breast, and with no dominant mass, nipple discharge, or bleeding present.   KPS = 90  100 - Normal; no complaints; no evidence of disease. 90   - Able to carry on normal activity; minor signs or symptoms of disease. 80   - Normal activity with effort; some signs or symptoms of disease. 8   - Cares for self; unable to carry on normal activity or to do active work. 60   - Requires occasional assistance, but is able to care for most of his personal needs. 50   - Requires considerable assistance and frequent medical care. 40   - Disabled; requires special care and assistance. 30   - Severely disabled; hospital  admission is indicated although death not imminent. 20   - Very sick; hospital admission necessary; active supportive treatment necessary. 10   - Moribund; fatal processes progressing rapidly. 0     - Dead  Karnofsky DA, Abelmann WH, Craver LS and Burchenal [facility] [phone]) The use of the nitrogen mustards in the palliative treatment of carcinoma: with particular reference to bronchogenic carcinoma Cancer 1 634-56  LABORATORY DATA:  Lab Results  Component Value Date   WBC 8.6 09/10/2023   HGB 11.0 (L) 09/10/2023   HCT 33.3 (L) 09/10/2023   MCV 83.7 09/10/2023   PLT 320 09/10/2023   Lab Results  Component Value Date   NA 137 09/10/2023   K 4.2 09/10/2023   CL 103 09/10/2023   CO2 28 09/10/2023   Lab Results  Component Value Date   ALT 13 09/10/2023   AST 13 (L) 09/10/2023   ALKPHOS 132 (H) 09/10/2023   BILITOT 0.7 09/10/2023    PULMONARY FUNCTION TEST:   Review Flowsheet        No data to display          RADIOGRAPHY: No results found.    IMPRESSION: Stage IA (cT1c, cN0, cM0) Left Breast UOQ, Invasive Ductal Carcinoma, ER+ / PR+ / Her2-, Grade 1  Patient will be a  candidate for breast conservation with radiotherapy to the left breast. We discussed the general course of radiation, potential side effects, and toxicities with radiation.  We discussed that given her good prognosis breast cancer and advanced age, she may be able to safely omit radiation therapy if she proceeds with adjuvant hormonal therapy.  Alternatively if she did not wish to receive adjuvant hormonal therapy she could proceed with an ultra hypofractionated course of radiation therapy over approximately 5 sessions.  PLAN:  Genetics  Left breast lumpectomy  +/- adjuvant radiation therapy  +/- aromatase inhibitor    ------------------------------------------------  Lynwood CHARM Nasuti, PhD, MD  This document serves as a record of services personally performed by Lynwood Nasuti, MD. It was created on his behalf  by Dorthy Fuse, a trained medical scribe. The creation of this record is based on the scribe's personal observations and  the provider's statements to them. This document has been checked and approved by the attending provider.

## 2023-09-10 NOTE — Progress Notes (Signed)
 [facility]   Telephone:([phone] Fax:(336) [phone]   Clinic New Consult Note   Patient Care Team: Katrinka Garnette KIDD, MD as PCP - General (Family Medicine) Tyree Nanetta SAILOR, RN as Oncology Nurse Navigator Gerome, Devere HERO, RN as Oncology Nurse Navigator Curvin Deward MOULD, MD as Consulting Physician (General Surgery) Lanny Callander, MD as Consulting Physician (Hematology) Shannon Agent, MD as Consulting Physician (Radiation Oncology) 09/10/2023  CHIEF COMPLAINTS/PURPOSE OF CONSULTATION:  Newly diagnosed left breast  REFERRING PHYSICIAN: [facility]  Discussed the use of AI scribe software for clinical note transcription with the patient, who gave verbal consent to proceed.  History of Present Illness Jane Cox is an 89 year old female with a history of breast cancer who presents for a new consult regarding a recent diagnosis of breast cancer. She is accompanied by her son, [name].  A recent mammogram revealed a 1.7 cm mass in the left breast. She has a history of breast cancer in the 1980s on the right side, treated with mastectomy without additional therapy. She missed her mammogram in 2024 due to her husband's illness but had one in 2023.  Her breast biopsy showed grade 1 invasive ductal carcinoma, ER 100% positive, PR 90% positive, HER2 negative, with Ki-67 5%.  Ultrasound of the axilla was negative.  She has no current breast discomfort, new pain, breathing issues, or gastrointestinal symptoms. Her medical history includes managed hypertension, osteopenia, arthritis, and anemia with low B12 levels since April 2024. She takes amlodipine , labetalol , hydrochlorothiazide , lisinopril , and Lipitor. She underwent a hysterectomy at age 36 for fibroids and a cholecystectomy.  Family history is significant for her father's lung cancer. She denies alcohol use and has never smoked. She lives with her son.     MEDICAL HISTORY:  Past Medical History:  Diagnosis Date   Allergy     Arthritis    Basal cell carcinoma    Family history of breast cancer    Family history of breast cancer    Family history of colon cancer    HX: breast cancer    Hyperlipidemia    Hypertension    Osteopenia     SURGICAL HISTORY: Past Surgical History:  Procedure Laterality Date   ABDOMINAL HYSTERECTOMY  01/28/1981   BREAST SURGERY Right 1980   mastectomy   CHOLECYSTECTOMY  01/29/1991   COLONOSCOPY     EYE SURGERY     VEIN SURGERY      SOCIAL HISTORY: Social History   Socioeconomic History   Marital status: Married    Spouse name: Not on file   Number of children: 1   Years of education: Not on file   Highest education level: Master's degree (e.g., MA, MS, MEng, MEd, MSW, MBA)  Occupational History   Not on file  Tobacco Use   Smoking status: Never   Smokeless tobacco: Never  Vaping Use   Vaping status: Never Used  Substance and Sexual Activity   Alcohol use: Not Currently    Alcohol/week: 1.0 standard drink of alcohol    Types: 1 drink(s) per week   Drug use: No   Sexual activity: Not on file  Other Topics Concern   Not on file  Social History Narrative   Married. Husband ill ([facility] pt- prostate cancer 2009-radiation/seed implant with colitis-worsening. Colitis, CMV, bronchitis and flu in feb 2015-SNF afterwards and came home 1 month later with op PT)      1 son-bachelor (patient here). No grandchildren.  Hobbies: ladies clubs previously before being home with husband, avid reader, doesn't enjoy outdoors.       Advanced Directives: Husband HCPOA. Plans for resuscitation.    Social Drivers of Corporate investment banker Strain: Low Risk  (08/29/2023)   Overall Financial Resource Strain (CARDIA)    Difficulty of Paying Living Expenses: Not hard at all  Food Insecurity: No Food Insecurity (08/29/2023)   Hunger Vital Sign    Worried About Running Out of Food in the Last Year: Never true    Ran Out of Food in the Last Year: Never true  Transportation  Needs: No Transportation Needs (08/29/2023)   PRAPARE - Administrator, Civil Service (Medical): No    Lack of Transportation (Non-Medical): No  Physical Activity: Inactive (08/29/2023)   Exercise Vital Sign    Days of Exercise per Week: 0 days    Minutes of Exercise per Session: Not on file  Stress: No Stress Concern Present (08/29/2023)   [employer] of Occupational Health - Occupational Stress Questionnaire    Feeling of Stress: Not at all  Social Connections: Moderately Isolated (08/29/2023)   Social Connection and Isolation Panel    Frequency of Communication with Friends and Family: Once a week    Frequency of Social Gatherings with Friends and Family: Once a week    Attends Religious Services: More than 4 times per year    Active Member of [employer] or Organizations: Yes    Attends Banker Meetings: 1 to 4 times per year    Marital Status: Widowed  Catering manager Violence: Not on file    FAMILY HISTORY: Family History  Problem Relation Age of Onset   Lung cancer Father    Breast cancer Maternal Aunt        dx. late 50s-60s   Colon cancer Maternal Grandfather 28    ALLERGIES:  is allergic to bacitracin-polymyxin b, shellfish allergy, and neosporin [neomycin-polymyxin-gramicidin].  MEDICATIONS:  Current Outpatient Medications  Medication Sig Dispense Refill   acetaminophen  (TYLENOL ) 650 MG CR tablet Take 650 mg by mouth every 8 (eight) hours as needed. Reported on 02/27/2015     amLODipine  (NORVASC ) 5 MG tablet Take 1.5 tablets (7.5 mg total) by mouth daily. 135 tablet 3   atorvastatin  (LIPITOR) 20 MG tablet TAKE 1/2 TABLET BY MOUTH DAILY 45 tablet 1   hydrochlorothiazide  (MICROZIDE ) 12.5 MG capsule TAKE 1 CAPSULE BY MOUTH EVERY DAY 90 capsule 1   labetalol  (NORMODYNE ) 100 MG tablet TAKE 1 TABLET BY MOUTH TWICE A DAY 180 tablet 3   lisinopril  (ZESTRIL ) 40 MG tablet TAKE 1 TABLET BY MOUTH EVERY DAY 90 tablet 1   No current facility-administered  medications for this visit.    REVIEW OF SYSTEMS:   Constitutional: Denies fevers, chills or abnormal night sweats Eyes: Denies blurriness of vision, double vision or watery eyes Ears, nose, mouth, throat, and face: Denies mucositis or sore throat Respiratory: Denies cough, dyspnea or wheezes Cardiovascular: Denies palpitation, chest discomfort or lower extremity swelling Gastrointestinal:  Denies nausea, heartburn or change in bowel habits Skin: Denies abnormal skin rashes Lymphatics: Denies new lymphadenopathy or easy bruising Neurological:Denies numbness, tingling or new weaknesses Behavioral/Psych: Mood is stable, no new changes  All other systems were reviewed with the patient and are negative.  PHYSICAL EXAMINATION: ECOG PERFORMANCE STATUS: 0 - Asymptomatic  Vitals:   09/10/23 1255 09/10/23 1257  BP: (!) 180/62 (!) 174/54  Pulse: 63   Resp: 20   SpO2: 95%  Filed Weights   09/10/23 1255  Weight: 141 lb (64 kg)    GENERAL:alert, no distress and comfortable SKIN: skin color, texture, turgor are normal, no rashes or significant lesions EYES: normal, conjunctiva are pink and non-injected, sclera clear OROPHARYNX:no exudate, no erythema and lips, buccal mucosa, and tongue normal  NECK: supple, thyroid  normal size, non-tender, without nodularity LYMPH:  no palpable lymphadenopathy in the cervical, axillary or inguinal LUNGS: clear to auscultation and percussion with normal breathing effort HEART: regular rate & rhythm and no murmurs and no lower extremity edema ABDOMEN:abdomen soft, non-tender and normal bowel sounds Musculoskeletal:no cyanosis of digits and no clubbing  PSYCH: alert & oriented x 3 with fluent speech NEURO: no focal motor/sensory deficits  Physical Exam BREAST: No palpable masses.  LABORATORY DATA:  I have reviewed the data as listed    Latest Ref Rng & Units 09/10/2023   12:24 PM 05/28/2023    7:18 PM 02/25/2023    3:52 PM  CBC  WBC 4.0 - 10.5  K/uL 8.6  15.5  7.5   Hemoglobin 12.0 - 15.0 g/dL 88.9  88.4  88.4   Hematocrit 36.0 - 46.0 % 33.3  35.5  34.3   Platelets 150 - 400 K/uL 320  305  258.0        Latest Ref Rng & Units 09/10/2023   12:24 PM 05/28/2023    7:18 PM 02/25/2023    3:52 PM  CMP  Glucose 70 - 99 mg/dL 96  853  894   BUN 8 - 23 mg/dL 13  19  16    Creatinine 0.44 - 1.00 mg/dL 9.18  9.18  9.10   Sodium 135 - 145 mmol/L 137  135  138   Potassium 3.5 - 5.1 mmol/L 4.2  3.6  4.1   Chloride 98 - 111 mmol/L 103  102  103   CO2 22 - 32 mmol/L 28  23  26    Calcium  8.9 - 10.3 mg/dL 89.9  9.7  89.9   Total Protein 6.5 - 8.1 g/dL 6.7   6.8   Total Bilirubin 0.0 - 1.2 mg/dL 0.7   0.8   Alkaline Phos 38 - 126 U/L 132   108   AST 15 - 41 U/L 13   16   ALT 0 - 44 U/L 13   15       RADIOGRAPHIC STUDIES: I have personally reviewed the radiological images as listed and agreed with the findings in the report. No results found.   Assessment & Plan Malignant neoplasm of upper-outer quadrant of left breast, invasive ductal carcinoma, cT1cN0M0, stage 1, grade 1, ER-positive, HER2-negative Invasive ductal carcinoma of the left breast, stage 1, grade 1, ER-positive, HER2-negative. The tumor measures 1.7 cm, is slow-growing, and has a low likelihood of spreading. Lymph nodes appear normal on ultrasound. The cancer is ER-positive, indicating sensitivity to estrogen, and HER2-negative. Tamoxifen is preferred due to its benefits for bone strength and lower risk of joint pain compared to anastrozole. - Proceed with surgery without lymph node removal due to favorable tumor characteristics. - Consider anti-estrogen therapy post-surgery, specifically tamoxifen for 5 years if tolerated, to reduce the risk of recurrence. -Due to her advanced age, she is not a candidate for chemotherapy, will not do Oncotype. - Discuss potential side effects of tamoxifen, including hot flashes, and its benefits for bone strength due to existing osteopenia. -  Coordinate with the surgeon regarding the need for radiation therapy post-surgery. - Schedule follow-up after surgery or  after radiation therapy if required.  Anemia Anemia with a hemoglobin level of 11, present since April 2024. B12 deficiency identified as a contributing factor. No reported bleeding or bowel movement issues. Previous B12 supplementation was not sustained long enough to observe improvement. - Reintroduce oral B12 supplementation. - Check B12 levels at the next visit to assess the need for B12 injections if levels remain low post-surgery. - Coordinate with primary care physician for B12 management.  Plan - Imaging and biopsy results discussed with patient and her son in detail - She will proceed with left breast lumpectomy soon - Follow-up 1 months after surgery, to finalize her adjuvant antiestrogen therapy.   Orders Placed This Encounter  Procedures   Vitamin B12    Standing Status:   Standing    Number of Occurrences:   10    Expiration Date:   09/09/2024   CBC with Differential/Platelet    Standing Status:   Standing    Number of Occurrences:   50    Expiration Date:   09/09/2024   Comprehensive metabolic panel with GFR    Standing Status:   Standing    Number of Occurrences:   50    Expiration Date:   09/09/2024    All questions were answered. The patient knows to call the clinic with any problems, questions or concerns. I spent 30 minutes counseling the patient face to face. The total time spent in the appointment was 45 minutes including review of chart and various tests results, discussions about plan of care and coordination of care plan.     Onita Mattock, MD 09/10/2023 5:42 PM

## 2023-09-12 ENCOUNTER — Other Ambulatory Visit: Payer: Self-pay | Admitting: Family Medicine

## 2023-09-15 ENCOUNTER — Encounter: Payer: Self-pay | Admitting: General Practice

## 2023-09-15 NOTE — Progress Notes (Signed)
 [facility] Spiritual Care Note  Met with Jane Cox and her son in Breast Multidisciplinary Clinic to introduce Support Center team/resources.  she completed SDOH screening; results follow below.    SDOH Screenings   Food Insecurity: No Food Insecurity (09/15/2023)  Housing: Low Risk  (09/15/2023)  Transportation Needs: No Transportation Needs (09/15/2023)  Utilities: Not At Risk (09/15/2023)  Alcohol Screen: Low Risk  (02/02/2023)  Depression (PHQ2-9): Low Risk  (09/15/2023)  Financial Resource Strain: Low Risk  (08/29/2023)  Physical Activity: Inactive (08/29/2023)  Social Connections: Moderately Isolated (08/29/2023)  Stress: No Stress Concern Present (08/29/2023)  Tobacco Use: Low Risk  (09/10/2023)   Received from [facility] and patient discussed common feelings and emotions when being diagnosed with cancer, and the importance of support during treatment.  Chaplain informed patient of the support team and support services at [facility].  Chaplain provided contact information and encouraged patient to call with any questions or concerns.  Follow up needed: No. Jane Cox (pronounced BLAY-dle) reports no needs at this time and plans to follow up with Spiritual Care as needed/desired.   [address] Olam Corrigan, [place], [facility] Pager [phone] Voicemail [phone]

## 2023-09-19 ENCOUNTER — Encounter (HOSPITAL_COMMUNITY)

## 2023-09-19 ENCOUNTER — Encounter: Payer: Self-pay | Admitting: *Deleted

## 2023-09-19 ENCOUNTER — Telehealth: Payer: Self-pay | Admitting: *Deleted

## 2023-09-19 ENCOUNTER — Ambulatory Visit: Admitting: Vascular Surgery

## 2023-09-19 DIAGNOSIS — Z170 Estrogen receptor positive status [ER+]: Secondary | ICD-10-CM

## 2023-09-19 NOTE — Telephone Encounter (Signed)
 Spoke with patient to follow up from [facility] 8/13 and assess navigation needs. Patient states she has an appt with Dr. Lanny 9/11 for follow up but she would like to cancel this apt and pursue xrt 1st.  Informed her she would get a call from Dr. Colton office to schedule an appt 3-4 weeks after sx and that she could follow up with Dr. Lanny after xrt complete but she is not sure she wants to do that as she does not want to take anti-estrogen and will decide at a later date.  Encouraged her to call with any other questions or concerns. Appt cx for Dr. Lanny 9/11. Patient verbalized understanding.

## 2023-09-23 ENCOUNTER — Ambulatory Visit: Payer: Self-pay | Admitting: Genetic Counselor

## 2023-09-23 ENCOUNTER — Encounter: Payer: Self-pay | Admitting: Genetic Counselor

## 2023-09-23 ENCOUNTER — Telehealth: Payer: Self-pay | Admitting: Genetic Counselor

## 2023-09-23 DIAGNOSIS — Z170 Estrogen receptor positive status [ER+]: Secondary | ICD-10-CM

## 2023-09-23 DIAGNOSIS — Z1379 Encounter for other screening for genetic and chromosomal anomalies: Secondary | ICD-10-CM

## 2023-09-23 NOTE — Progress Notes (Signed)
 HPI:  Ms. Klinck was previously seen in the [facility] due to a personal and family history of cancer and concerns regarding a hereditary predisposition to cancer. Please refer to our prior cancer genetics clinic note for more information regarding our discussion, assessment and recommendations, at the time. Ms. Elias recent genetic test results were disclosed to her, as were recommendations warranted by these results. These results and recommendations are discussed in more detail below.  CANCER HISTORY:  Oncology History  Malignant neoplasm of upper-outer quadrant of left breast in female, estrogen receptor positive (HCC)  09/08/2023 Initial Diagnosis   Malignant neoplasm of upper-outer quadrant of left breast in female, estrogen receptor positive (HCC)   09/09/2023 Cancer Staging   Staging form: Breast, AJCC 8th Edition - Clinical: Stage IA (cT1c, cN0, cM0, G1, ER+, PR+, HER2-) - Signed by Lanny Callander, MD on 09/09/2023 Histologic grading system: 3 grade system   09/22/2023 Genetic Testing   Negative genetic testing on the CancerNext-Expanded+RNAinsight panel.  The report date is September 22, 2023.  The CancerNext-Expanded gene panel offered by [facility] and includes sequencing, rearrangement, and RNA analysis for the following 77 genes: AIP, ALK, APC, ATM, BAP1, BARD1, BMPR1A, BRCA1, BRCA2, BRIP1, CDC73, CDH1, CDK4, CDKN1B, CDKN2A, CEBPA, CHEK2, CTNNA1, DDX41, DICER1, ETV6, FH, FLCN, GATA2, LZTR1, MAX, MBD4, MEN1, MET, MLH1, MSH2, MSH3, MSH6, MUTYH, NF1, NF2, NTHL1, PALB2, PHOX2B, PMS2, POT1, PRKAR1A, PTCH1, PTEN, RAD51C, RAD51D, RB1, RET, RPS20, RUNX1, SDHA, SDHAF2, SDHB, SDHC, SDHD, SMAD4, SMARCA4, SMARCB1, SMARCE1, STK11, SUFU, TMEM127, TP53, TSC1, TSC2, VHL, and WT1 (sequencing and deletion/duplication); AXIN2, CTNNA1, DDX41, EGFR, HOXB13, KIT, MBD4, MITF, MSH3, PDGFRA, POLD1 and POLE (sequencing only); EPCAM and GREM1 (deletion/duplication only). RNA data is routinely  analyzed for use in variant interpretation for all genes.     FAMILY HISTORY:  We obtained a detailed, 4-generation family history.  Significant diagnoses are listed below: Family History  Problem Relation Age of Onset   Lung cancer Father    Breast cancer Maternal Aunt        dx. late 50s-60s   Colon cancer Maternal Grandfather 73       The patient has a son who is cancer free.  Her siblings are not reported to have cancer.  Her parents are deceased.   The patient's mother had three sisters, one had breast cancer in her 74's.  The maternal grandfather had colon cancer.   The patient's father had lung cancer and was a heavy smoker.  He had lung cancer.  There is no other reported family history of cancer.   Ms. Digman is unaware of previous family history of genetic testing for hereditary cancer risks. There is no reported Ashkenazi Jewish ancestry. There is no known consanguinity.  GENETIC TEST RESULTS: Genetic testing reported out on September 22, 2023 through the CancerNext-Expanded+RNAinsight cancer panel found no pathogenic mutations. The CancerNext-Expanded gene panel offered by [facility] and includes sequencing, rearrangement, and RNA analysis for the following 77 genes: AIP, ALK, APC, ATM, BAP1, BARD1, BMPR1A, BRCA1, BRCA2, BRIP1, CDC73, CDH1, CDK4, CDKN1B, CDKN2A, CEBPA, CHEK2, CTNNA1, DDX41, DICER1, ETV6, FH, FLCN, GATA2, LZTR1, MAX, MBD4, MEN1, MET, MLH1, MSH2, MSH3, MSH6, MUTYH, NF1, NF2, NTHL1, PALB2, PHOX2B, PMS2, POT1, PRKAR1A, PTCH1, PTEN, RAD51C, RAD51D, RB1, RET, RPS20, RUNX1, SDHA, SDHAF2, SDHB, SDHC, SDHD, SMAD4, SMARCA4, SMARCB1, SMARCE1, STK11, SUFU, TMEM127, TP53, TSC1, TSC2, VHL, and WT1 (sequencing and deletion/duplication); AXIN2, CTNNA1, DDX41, EGFR, HOXB13, KIT, MBD4, MITF, MSH3, PDGFRA, POLD1 and POLE (sequencing only); EPCAM and  GREM1 (deletion/duplication only). RNA data is routinely analyzed for use in variant interpretation for all genes. The test report has  been scanned into EPIC and is located under the Molecular Pathology section of the Results Review tab.  A portion of the result report is included below for reference.     We discussed with Ms. Greer that because current genetic testing is not perfect, it is possible there may be a gene mutation in one of these genes that current testing cannot detect, but that chance is small.  We also discussed, that there could be another gene that has not yet been discovered, or that we have not yet tested, that is responsible for the cancer diagnoses in the family. It is also possible there is a hereditary cause for the cancer in the family that Ms. Riga did not inherit and therefore was not identified in her testing.  Therefore, it is important to remain in touch with cancer genetics in the future so that we can continue to offer Ms. Seely the most up to date genetic testing.   ADDITIONAL GENETIC TESTING: We discussed with Ms. Endres that her genetic testing was fairly extensive.  If there are genes identified to increase cancer risk that can be analyzed in the future, we would be happy to discuss and coordinate this testing at that time.    CANCER SCREENING RECOMMENDATIONS: Ms. Eid test result is considered negative (normal).  This means that we have not identified a hereditary cause for her personal and family history of cancer at this time. Most cancers happen by chance and this negative test suggests that her personal and family history of cancer may fall into this category.    Possible reasons for Ms. Betker's negative genetic test include:  1. There may be a gene mutation in one of these genes that current testing methods cannot detect but that chance is small.  2. There could be another gene that has not yet been discovered, or that we have not yet tested, that is responsible for the cancer diagnoses in the family.  3.  There may be no hereditary risk for cancer in the family. The cancers in Ms.  Kehoe and/or her family may be sporadic/familial or due to other genetic and environmental factors. 4. It is also possible there is a hereditary cause for the cancer in the family that Ms. Offenberger did not inherit.  Therefore, it is recommended she continue to follow the cancer management and screening guidelines provided by her oncology and primary healthcare provider. An individual's cancer risk and medical management are not determined by genetic test results alone. Overall cancer risk assessment incorporates additional factors, including personal medical history, family history, and any available genetic information that may result in a personalized plan for cancer prevention and surveillance  RECOMMENDATIONS FOR FAMILY MEMBERS:   Since she did not inherit a identifiable mutation in a cancer predisposition gene included on this panel, her children could not have inherited a known mutation from her in one of these genes. Individuals in this family might be at some increased risk of developing cancer, over the general population risk, simply due to the family history of cancer.  We recommended women in this family have a yearly mammogram beginning at age 54, or 41 years younger than the earliest onset of cancer, an annual clinical breast exam, and perform monthly breast self-exams. Women in this family should also have a gynecological exam as recommended by their primary provider. All family members  should be referred for colonoscopy starting at age 55, or 83 years younger than the earliest onset of cancer.  FOLLOW-UP: Lastly, we discussed with Ms. Eckersley that cancer genetics is a rapidly advancing field and it is possible that new genetic tests will be appropriate for her and/or her family members in the future. We encouraged her to remain in contact with cancer genetics on an annual basis so we can update her personal and family histories and let her know of advances in cancer genetics that may benefit  this family.   Our contact number was provided. Ms. Cubillos questions were answered to her satisfaction, and she knows she is welcome to call us  at anytime with additional questions or concerns.   Darice Monte, MS, [facility] Licensed, Certified Genetic Counselor [email or web address]

## 2023-09-23 NOTE — Telephone Encounter (Signed)
 I contacted  Jane Cox to discuss her genetic testing results. No pathogenic variants were identified in the 77 genes analyzed. Discussed that we do not know why she has breast cancer or why there is cancer in the family. It could be due to a different gene that we are not testing, or maybe our current technology may not be able to pick something up.  It will be important for her to keep in contact with genetics to keep up with whether additional testing may be needed.Detailed clinic note to follow.   The test report will be scanned into EPIC and will be located under the Molecular Pathology section of the Results Review tab.  A portion of the result report is included below for reference.

## 2023-09-25 ENCOUNTER — Encounter: Payer: Self-pay | Admitting: Diagnostic Radiology

## 2023-09-25 NOTE — Pre-Procedure Instructions (Signed)
 Surgical Instructions   Your procedure is scheduled on October 01, 2023. Report to [facility] Main Entrance A at 8:30 A.M., then check in with the Admitting office. Any questions or running late day of surgery: call [phone]  Questions prior to your surgery date: call [phone], Monday-Friday, 8am-4pm. If you experience any cold or flu symptoms such as cough, fever, chills, shortness of breath, etc. between now and your scheduled surgery, please notify us  at the above number.     Remember:  Do not eat after midnight the night before your surgery   You may drink clear liquids until 7:30 AM the morning of your surgery.   Clear liquids allowed are: Water, Non-Citrus Juices (without pulp), Carbonated Beverages, Clear Tea (no milk, honey, etc.), Black Coffee Only (NO MILK, CREAM OR POWDERED CREAMER of any kind), and Gatorade.    Take these medicines the morning of surgery with A SIP OF WATER: amLODipine  (NORVASC )  atorvastatin  (LIPITOR)  labetalol  (NORMODYNE )    One week prior to surgery, STOP taking any Aspirin (unless otherwise instructed by your surgeon) Aleve , Naproxen , Ibuprofen, Motrin, Advil, Goody's, BC's, all herbal medications, fish oil, and non-prescription vitamins.                     Do NOT Smoke (Tobacco/Vaping) for 24 hours prior to your procedure.  If you use a CPAP at night, you may bring your mask/headgear for your overnight stay.   You will be asked to remove any contacts, glasses, piercing's, hearing aid's, dentures/partials prior to surgery. Please bring cases for these items if needed.    Patients discharged the day of surgery will not be allowed to drive home, and someone needs to stay with them for 24 hours.  SURGICAL WAITING ROOM VISITATION Patients may have no more than 2 support people in the waiting area - these visitors may rotate.   Pre-op nurse will coordinate an appropriate time for 1 ADULT support person, who may not rotate, to accompany  patient in pre-op.  Children under the age of 77 must have an adult with them who is not the patient and must remain in the main waiting area with an adult.  If the patient needs to stay at the hospital during part of their recovery, the visitor guidelines for inpatient rooms apply.  Please refer to the [facility] website for the visitor guidelines for any additional information.   If you received a COVID test during your pre-op visit  it is requested that you wear a mask when out in public, stay away from anyone that may not be feeling well and notify your surgeon if you develop symptoms. If you have been in contact with anyone that has tested positive in the last 10 days please notify you surgeon.      Pre-operative CHG Bathing Instructions   You can play a key role in reducing the risk of infection after surgery. Your skin needs to be as free of germs as possible. You can reduce the number of germs on your skin by washing with CHG (chlorhexidine gluconate) soap before surgery. CHG is an antiseptic soap that kills germs and continues to kill germs even after washing.   DO NOT use if you have an allergy to chlorhexidine/CHG or antibacterial soaps. If your skin becomes reddened or irritated, stop using the CHG and notify one of our RNs at [phone].              TAKE A SHOWER THE  NIGHT BEFORE SURGERY AND THE DAY OF SURGERY    Please keep in mind the following:  DO NOT shave, including legs and underarms, 48 hours prior to surgery.   You may shave your face before/day of surgery.  Place clean sheets on your bed the night before surgery Use a clean washcloth (not used since being washed) for each shower. DO NOT sleep with pet's night before surgery.  CHG Shower Instructions:  Wash your face and private area with normal soap. If you choose to wash your hair, wash first with your normal shampoo.  After you use shampoo/soap, rinse your hair and body thoroughly to remove shampoo/soap  residue.  Turn the water OFF and apply half the bottle of CHG soap to a CLEAN washcloth.  Apply CHG soap ONLY FROM YOUR NECK DOWN TO YOUR TOES (washing for 3-5 minutes)  DO NOT use CHG soap on face, private areas, open wounds, or sores.  Pay special attention to the area where your surgery is being performed.  If you are having back surgery, having someone wash your back for you may be helpful. Wait 2 minutes after CHG soap is applied, then you may rinse off the CHG soap.  Pat dry with a clean towel  Put on clean pajamas    Additional instructions for the day of surgery: DO NOT APPLY any lotions, deodorants, cologne, or perfumes.   Do not wear jewelry or makeup Do not wear nail polish, gel polish, artificial nails, or any other type of covering on natural nails (fingers and toes) Do not bring valuables to the hospital. [facility] is not responsible for valuables/personal belongings. Put on clean/comfortable clothes.  Please brush your teeth.  Ask your nurse before applying any prescription medications to the skin.

## 2023-09-26 ENCOUNTER — Other Ambulatory Visit: Payer: Self-pay

## 2023-09-26 ENCOUNTER — Encounter (HOSPITAL_COMMUNITY): Payer: Self-pay

## 2023-09-26 ENCOUNTER — Encounter (HOSPITAL_COMMUNITY)
Admission: RE | Admit: 2023-09-26 | Discharge: 2023-09-26 | Disposition: A | Discharge status: Home / Self Care | Source: Ambulatory Visit | Attending: General Surgery | Admitting: General Surgery

## 2023-09-26 DIAGNOSIS — Z01818 Encounter for other preprocedural examination: Secondary | ICD-10-CM | POA: Insufficient documentation

## 2023-09-26 HISTORY — DX: Other specified postprocedural states: Z98.890

## 2023-09-26 HISTORY — DX: Anemia, unspecified: D64.9

## 2023-09-26 NOTE — Progress Notes (Signed)
 PCP - Katrinka Garnette KIDD, MD  Cardiologist -  Delford Maude BROCKS, MD  (LOV [phone] to follow up prn)  PPM/ICD - denies Device Orders - n/a Rep Notified - n/a  Chest x-ray - denies EKG - 09-26-23 Stress Test - denies ECHO - denies Cardiac Cath - denies  Sleep Study - denies CPAP - n/a  DM -denies  Blood Thinner Instructions:denies Aspirin Instructions:n/a  ERAS Protcol -7:30   COVID TEST- n/a   Anesthesia review: yes, breast seed placement  Patient denies shortness of breath, fever, cough and chest pain at PAT appointment   All instructions explained to the patient, with a verbal understanding of the material. Patient agrees to go over the instructions while at home for a better understanding. Patient also instructed to self quarantine after being tested for COVID-19. The opportunity to ask questions was provided.

## 2023-09-30 ENCOUNTER — Telehealth: Payer: Self-pay | Admitting: Genetic Counselor

## 2023-09-30 DIAGNOSIS — C50412 Malignant neoplasm of upper-outer quadrant of left female breast: Secondary | ICD-10-CM | POA: Diagnosis not present

## 2023-09-30 NOTE — Telephone Encounter (Signed)
 Called patient about her test results.  She states that she wanted something in writing so she could show her family. I will put a copy of my clinic note and her test results in the mail.  Darice Monte, MS, CGC  Licensed, Patent attorney [email or web address] phone: [phone]

## 2023-10-01 ENCOUNTER — Encounter (HOSPITAL_COMMUNITY): Payer: Self-pay | Admitting: General Surgery

## 2023-10-01 ENCOUNTER — Encounter (HOSPITAL_COMMUNITY)
Admission: RE | Disposition: A | Discharge status: Home / Self Care | Payer: Self-pay | Source: Home / Self Care | Attending: General Surgery

## 2023-10-01 ENCOUNTER — Ambulatory Visit (HOSPITAL_COMMUNITY): Payer: Self-pay | Admitting: Physician Assistant

## 2023-10-01 ENCOUNTER — Other Ambulatory Visit: Payer: Self-pay

## 2023-10-01 ENCOUNTER — Ambulatory Visit (HOSPITAL_COMMUNITY)
Admission: RE | Admit: 2023-10-01 | Discharge: 2023-10-01 | Disposition: A | Discharge status: Home / Self Care | Attending: General Surgery | Admitting: General Surgery

## 2023-10-01 ENCOUNTER — Ambulatory Visit (HOSPITAL_COMMUNITY): Admitting: Anesthesiology

## 2023-10-01 DIAGNOSIS — R920 Mammographic microcalcification found on diagnostic imaging of breast: Secondary | ICD-10-CM | POA: Diagnosis not present

## 2023-10-01 DIAGNOSIS — M1990 Unspecified osteoarthritis, unspecified site: Secondary | ICD-10-CM | POA: Diagnosis not present

## 2023-10-01 DIAGNOSIS — I739 Peripheral vascular disease, unspecified: Secondary | ICD-10-CM | POA: Insufficient documentation

## 2023-10-01 DIAGNOSIS — Z1721 Progesterone receptor positive status: Secondary | ICD-10-CM | POA: Insufficient documentation

## 2023-10-01 DIAGNOSIS — Z79899 Other long term (current) drug therapy: Secondary | ICD-10-CM | POA: Insufficient documentation

## 2023-10-01 DIAGNOSIS — Z170 Estrogen receptor positive status [ER+]: Secondary | ICD-10-CM | POA: Insufficient documentation

## 2023-10-01 DIAGNOSIS — Z9011 Acquired absence of right breast and nipple: Secondary | ICD-10-CM | POA: Insufficient documentation

## 2023-10-01 DIAGNOSIS — I10 Essential (primary) hypertension: Secondary | ICD-10-CM | POA: Insufficient documentation

## 2023-10-01 DIAGNOSIS — N6012 Diffuse cystic mastopathy of left breast: Secondary | ICD-10-CM | POA: Diagnosis not present

## 2023-10-01 DIAGNOSIS — C50912 Malignant neoplasm of unspecified site of left female breast: Secondary | ICD-10-CM | POA: Diagnosis not present

## 2023-10-01 DIAGNOSIS — C50412 Malignant neoplasm of upper-outer quadrant of left female breast: Secondary | ICD-10-CM | POA: Diagnosis not present

## 2023-10-01 DIAGNOSIS — N6042 Mammary duct ectasia of left breast: Secondary | ICD-10-CM | POA: Diagnosis not present

## 2023-10-01 DIAGNOSIS — Z1732 Human epidermal growth factor receptor 2 negative status: Secondary | ICD-10-CM | POA: Diagnosis not present

## 2023-10-01 DIAGNOSIS — N6082 Other benign mammary dysplasias of left breast: Secondary | ICD-10-CM | POA: Diagnosis not present

## 2023-10-01 DIAGNOSIS — N6022 Fibroadenosis of left breast: Secondary | ICD-10-CM | POA: Diagnosis not present

## 2023-10-01 DIAGNOSIS — D242 Benign neoplasm of left breast: Secondary | ICD-10-CM | POA: Diagnosis not present

## 2023-10-01 DIAGNOSIS — N62 Hypertrophy of breast: Secondary | ICD-10-CM | POA: Diagnosis not present

## 2023-10-01 HISTORY — PX: BREAST LUMPECTOMY WITH RADIOACTIVE SEED LOCALIZATION: SHX6424

## 2023-10-01 SURGERY — BREAST LUMPECTOMY WITH RADIOACTIVE SEED LOCALIZATION
Anesthesia: General | Site: Breast | Laterality: Left

## 2023-10-01 MED ORDER — CHLORHEXIDINE GLUCONATE CLOTH 2 % EX PADS: 6.0000 | MEDICATED_PAD | Freq: Once | CUTANEOUS | Status: DC

## 2023-10-01 MED ORDER — OXYCODONE HCL 5 MG PO TABS
5.0000 mg | ORAL_TABLET | Freq: Once | ORAL | Status: AC | PRN
  Administered 2023-10-01: 5 mg via ORAL

## 2023-10-01 MED ORDER — DEXAMETHASONE SODIUM PHOSPHATE 10 MG/ML IJ SOLN
INTRAMUSCULAR | Status: DC | PRN
Start: 2023-10-01 — End: 2023-10-01
  Administered 2023-10-01: 4 mg via INTRAVENOUS

## 2023-10-01 MED ORDER — DROPERIDOL 2.5 MG/ML IJ SOLN: 0.6250 mg | Freq: Once | INTRAMUSCULAR | Status: DC | PRN

## 2023-10-01 MED ORDER — CHLORHEXIDINE GLUCONATE 0.12 % MT SOLN
15.0000 mL | Freq: Once | OROMUCOSAL | Status: AC
  Administered 2023-10-01: 15 mL via OROMUCOSAL
  Filled 2023-10-01: qty 15

## 2023-10-01 MED ORDER — ACETAMINOPHEN 10 MG/ML IV SOLN: 1000.0000 mg | Freq: Once | INTRAVENOUS | Status: DC | PRN

## 2023-10-01 MED ORDER — EPHEDRINE SULFATE-NACL 50-0.9 MG/10ML-% IV SOSY
PREFILLED_SYRINGE | INTRAVENOUS | Status: DC | PRN
  Administered 2023-10-01 (×6): 2.5 mg via INTRAVENOUS

## 2023-10-01 MED ORDER — DEXAMETHASONE SODIUM PHOSPHATE 10 MG/ML IJ SOLN
INTRAMUSCULAR | Status: AC
  Filled 2023-10-01: qty 1

## 2023-10-01 MED ORDER — LACTATED RINGERS IV SOLN: INTRAVENOUS | Status: DC

## 2023-10-01 MED ORDER — GABAPENTIN 100 MG PO CAPS
100.0000 mg | ORAL_CAPSULE | ORAL | Status: AC
  Administered 2023-10-01: 100 mg via ORAL
  Filled 2023-10-01: qty 1

## 2023-10-01 MED ORDER — OXYCODONE HCL 5 MG/5ML PO SOLN: 5.0000 mg | Freq: Once | ORAL | Status: AC | PRN

## 2023-10-01 MED ORDER — LIDOCAINE 2% (20 MG/ML) 5 ML SYRINGE
INTRAMUSCULAR | Status: AC
  Filled 2023-10-01: qty 5

## 2023-10-01 MED ORDER — ONDANSETRON HCL 4 MG/2ML IJ SOLN
INTRAMUSCULAR | Status: AC
  Filled 2023-10-01: qty 2

## 2023-10-01 MED ORDER — FENTANYL CITRATE (PF) 100 MCG/2ML IJ SOLN: 25.0000 ug | INTRAMUSCULAR | Status: DC | PRN

## 2023-10-01 MED ORDER — CEFAZOLIN SODIUM-DEXTROSE 2-4 GM/100ML-% IV SOLN
2.0000 g | INTRAVENOUS | Status: AC
  Administered 2023-10-01: 2 g via INTRAVENOUS
  Filled 2023-10-01: qty 100

## 2023-10-01 MED ORDER — BUPIVACAINE-EPINEPHRINE (PF) 0.25% -1:200000 IJ SOLN
INTRAMUSCULAR | Status: AC
Start: 2023-10-01 — End: 2023-10-01
  Filled 2023-10-01: qty 30

## 2023-10-01 MED ORDER — PROPOFOL 10 MG/ML IV BOLUS
INTRAVENOUS | Status: DC | PRN
  Administered 2023-10-01: 110 mg via INTRAVENOUS

## 2023-10-01 MED ORDER — LIDOCAINE 2% (20 MG/ML) 5 ML SYRINGE
INTRAMUSCULAR | Status: DC | PRN
Start: 2023-10-01 — End: 2023-10-01
  Administered 2023-10-01: 60 mg via INTRAVENOUS

## 2023-10-01 MED ORDER — ACETAMINOPHEN 500 MG PO TABS
1000.0000 mg | ORAL_TABLET | ORAL | Status: AC
  Administered 2023-10-01: 1000 mg via ORAL
  Filled 2023-10-01: qty 2

## 2023-10-01 MED ORDER — OXYCODONE HCL 5 MG PO TABS: 5.0000 mg | ORAL_TABLET | Freq: Four times a day (QID) | ORAL | 0 refills | Status: DC | PRN

## 2023-10-01 MED ORDER — FENTANYL CITRATE (PF) 250 MCG/5ML IJ SOLN
INTRAMUSCULAR | Status: AC
  Filled 2023-10-01: qty 5

## 2023-10-01 MED ORDER — PHENYLEPHRINE 80 MCG/ML (10ML) SYRINGE FOR IV PUSH (FOR BLOOD PRESSURE SUPPORT)
PREFILLED_SYRINGE | INTRAVENOUS | Status: AC
  Filled 2023-10-01: qty 10

## 2023-10-01 MED ORDER — 0.9 % SODIUM CHLORIDE (POUR BTL) OPTIME
TOPICAL | Status: DC | PRN
  Administered 2023-10-01: 1000 mL

## 2023-10-01 MED ORDER — PROPOFOL 10 MG/ML IV BOLUS
INTRAVENOUS | Status: AC
  Filled 2023-10-01: qty 20

## 2023-10-01 MED ORDER — ONDANSETRON HCL 4 MG/2ML IJ SOLN
INTRAMUSCULAR | Status: DC | PRN
  Administered 2023-10-01: 4 mg via INTRAVENOUS

## 2023-10-01 MED ORDER — FENTANYL CITRATE (PF) 250 MCG/5ML IJ SOLN
INTRAMUSCULAR | Status: DC | PRN
  Administered 2023-10-01: 50 ug via INTRAVENOUS
  Administered 2023-10-01: 25 ug via INTRAVENOUS

## 2023-10-01 MED ORDER — OXYCODONE HCL 5 MG PO TABS
ORAL_TABLET | ORAL | Status: AC
  Filled 2023-10-01: qty 1

## 2023-10-01 MED ORDER — BUPIVACAINE-EPINEPHRINE 0.25% -1:200000 IJ SOLN
INTRAMUSCULAR | Status: DC | PRN
  Administered 2023-10-01: 20 mL

## 2023-10-01 MED ORDER — ORAL CARE MOUTH RINSE: 15.0000 mL | Freq: Once | OROMUCOSAL | Status: AC

## 2023-10-01 SURGICAL SUPPLY — 26 items
BAG COUNTER SPONGE SURGICOUNT (BAG) IMPLANT
BAND RUBBER #18 3X1/16 STRL (MISCELLANEOUS) IMPLANT
CANISTER SUCTION 3000ML PPV (SUCTIONS) ×2 IMPLANT
CHLORAPREP W/TINT 26 (MISCELLANEOUS) ×2 IMPLANT
CLIP APPLIE 9.375 MED OPEN (MISCELLANEOUS) IMPLANT
COVER PROBE W GEL 5X96 (DRAPES) ×2 IMPLANT
COVER SURGICAL LIGHT HANDLE (MISCELLANEOUS) ×2 IMPLANT
DERMABOND ADVANCED .7 DNX12 (GAUZE/BANDAGES/DRESSINGS) ×2 IMPLANT
DEVICE DUBIN SPECIMEN MAMMOGRA (MISCELLANEOUS) ×2 IMPLANT
DRAPE CHEST BREAST 15X10 FENES (DRAPES) ×2 IMPLANT
ELECT COATED BLADE 2.86 ST (ELECTRODE) ×2 IMPLANT
ELECTRODE REM PT RTRN 9FT ADLT (ELECTROSURGICAL) ×2 IMPLANT
GLOVE BIO SURGEON STRL SZ7.5 (GLOVE) ×4 IMPLANT
GOWN STRL REUS W/ TWL LRG LVL3 (GOWN DISPOSABLE) ×4 IMPLANT
KIT BASIN OR (CUSTOM PROCEDURE TRAY) ×2 IMPLANT
KIT MARKER MARGIN INK (KITS) ×2 IMPLANT
NDL HYPO 25GX1X1/2 BEV (NEEDLE) ×2 IMPLANT
NEEDLE HYPO 25GX1X1/2 BEV (NEEDLE) ×1 IMPLANT
NS IRRIG 1000ML POUR BTL (IV SOLUTION) ×2 IMPLANT
PACK GENERAL/GYN (CUSTOM PROCEDURE TRAY) ×2 IMPLANT
PENCIL BUTTON HOLSTER BLD 10FT (ELECTRODE) IMPLANT
SUT MNCRL AB 4-0 PS2 18 (SUTURE) ×2 IMPLANT
SUT VIC AB 3-0 SH 18 (SUTURE) ×2 IMPLANT
SYR CONTROL 10ML LL (SYRINGE) ×2 IMPLANT
TOWEL GREEN STERILE (TOWEL DISPOSABLE) ×2 IMPLANT
TOWEL GREEN STERILE FF (TOWEL DISPOSABLE) ×2 IMPLANT

## 2023-10-01 NOTE — Anesthesia Procedure Notes (Signed)
 Procedure Name: LMA Insertion Date/Time: 10/01/2023 10:28 AM  Performed by: Viviana Almarie DASEN, CRNAPre-anesthesia Checklist: Patient identified, Emergency Drugs available, Suction available and Patient being monitored Patient Re-evaluated:Patient Re-evaluated prior to induction Oxygen Delivery Method: Circle System Utilized Preoxygenation: Pre-oxygenation with 100% oxygen Induction Type: IV induction Ventilation: Mask ventilation without difficulty LMA: LMA inserted LMA Size: 4.0 Tube type: Oral Number of attempts: 1 Airway Equipment and Method: Bite block Placement Confirmation: positive ETCO2 Tube secured with: Tape Dental Injury: Teeth and Oropharynx as per pre-operative assessment

## 2023-10-01 NOTE — Anesthesia Preprocedure Evaluation (Addendum)
 Anesthesia Evaluation  Patient identified by MRN, date of birth, ID band Patient awake    Reviewed: Allergy & Precautions, H&P , NPO status , Patient's Chart, lab work & pertinent test results, reviewed documented beta blocker date and time   History of Anesthesia Complications (+) PONV and history of anesthetic complications  Airway Mallampati: III  TM Distance: >3 FB Neck ROM: Limited    Dental  (+) Dental Advisory Given, Teeth Intact   Pulmonary neg pulmonary ROS   Pulmonary exam normal breath sounds clear to auscultation       Cardiovascular hypertension, Pt. on home beta blockers and Pt. on medications (-) angina + Peripheral Vascular Disease  (-) Past MI  Rhythm:Regular Rate:Normal + Systolic murmurs    Neuro/Psych neg Seizures negative neurological ROS  negative psych ROS   GI/Hepatic negative GI ROS, Neg liver ROS,,,  Endo/Other  negative endocrine ROS    Renal/GU Hx of RAS     Musculoskeletal  (+) Arthritis ,    Abdominal   Peds  Hematology  (+) Blood dyscrasia, anemia   Anesthesia Other Findings   Reproductive/Obstetrics breast cancer                              Anesthesia Physical Anesthesia Plan  ASA: 3  Anesthesia Plan: General   Post-op Pain Management: Tylenol  PO (pre-op)*   Induction: Intravenous  PONV Risk Score and Plan: 4 or greater and TIVA, Ondansetron , Dexamethasone  and Propofol  infusion  Airway Management Planned: LMA  Additional Equipment: None  Intra-op Plan:   Post-operative Plan: Extubation in OR  Informed Consent: I have reviewed the patients History and Physical, chart, labs and discussed the procedure including the risks, benefits and alternatives for the proposed anesthesia with the patient or authorized representative who has indicated his/her understanding and acceptance.     Dental advisory given  Plan Discussed with:  CRNA  Anesthesia Plan Comments:          Anesthesia Quick Evaluation

## 2023-10-01 NOTE — Interval H&P Note (Signed)
 History and Physical Interval Note:  10/01/2023 9:27 AM  Jane Cox  has presented today for surgery, with the diagnosis of LEFT BREAST CANCER.  The various methods of treatment have been discussed with the patient and family. After consideration of risks, benefits and other options for treatment, the patient has consented to  Procedure(s) with comments: BREAST LUMPECTOMY WITH RADIOACTIVE SEED LOCALIZATION (Left) - LEFT BREAST RSL LUMPECTOMY as a surgical intervention.  The patient's history has been reviewed, patient examined, no change in status, stable for surgery.  I have reviewed the patient's chart and labs.  Questions were answered to the patient's satisfaction.     Deward Null III

## 2023-10-01 NOTE — Op Note (Signed)
 10/01/2023  11:20 AM  PATIENT:  Jane Cox  89 y.o. female  PRE-OPERATIVE DIAGNOSIS:  LEFT BREAST CANCER  POST-OPERATIVE DIAGNOSIS:  LEFT BREAST CANCER  PROCEDURE:  Procedure(s) with comments: BREAST LUMPECTOMY WITH MAG SEED LOCALIZATION (Left)  SURGEON:  Surgeons and Role:    * Curvin Deward MOULD, MD - Primary  PHYSICIAN ASSISTANT:   ASSISTANTS: none   ANESTHESIA:   local and general  EBL:  minimal   BLOOD ADMINISTERED:none  DRAINS: none   LOCAL MEDICATIONS USED:  MARCAINE      SPECIMEN:  Source of Specimen:  left breast tissue  DISPOSITION OF SPECIMEN:  PATHOLOGY  COUNTS:  YES  TOURNIQUET:  * No tourniquets in log *  DICTATION: .Dragon Dictation  After informed consent was obtained the patient was brought to the operating room and placed in the supine position on the operating table.  After adequate induction of general anesthesia the patient's left breast was prepped with ChloraPrep, allowed to dry, and draped in usual sterile manner.  An appropriate timeout was performed.  Previously a Magseed was placed in the upper outer left breast to mark an area of invasive breast cancer.  The Sentimag was then used to identify the signal from the seed.  The area around this was infiltrated with quarter percent Marcaine .  A curvilinear incision was made along the upper outer edge of the areola of the left breast with a 15 blade knife.  The incision was carried through the skin and subcutaneous tissue sharply with the electrocautery.  Dissection was then carried throughout the upper outer quadrant between the breast tissue and the subcutaneous fat and skin.  Once this dissection was beyond the area of the cancer I then removed a circular portion of breast tissue sharply with the electrocautery around the [facility] while checking the signal frequently.  Once the tissue was removed it was oriented with the appropriate paint colors.  A specimen radiograph was obtained that showed the clip and  seed to be near the center of the specimen.  The specimen was then sent to pathology for further evaluation.  Hemostasis was achieved using the Bovie electrocautery.  The wound was irrigated with saline and infiltrated with more quarter percent Marcaine .  The deep layer of the incision was then closed with layers of interrupted 3-0 Vicryl stitches.  The skin was then closed with interrupted 4-0 Monocryl subcuticular stitches.  Dermabond dressings were applied.  The patient tolerated the procedure well.  At the end of the case all needle sponge and instrument counts were correct.  The patient was then awakened and taken recovery in stable condition.  PLAN OF CARE: Discharge to home after PACU  PATIENT DISPOSITION:  PACU - hemodynamically stable.   Delay start of Pharmacological VTE agent (>24hrs) due to surgical blood loss or risk of bleeding: not applicable

## 2023-10-01 NOTE — H&P (Signed)
 REFERRING PHYSICIAN: Lanny Callander, MD PROVIDER: DEWARD GARNETTE NULL, MD MRN: [id number] DOB: 04-30-1933 Subjective   Chief Complaint: Breast Cancer  History of Present Illness: Jane Cox is a 89 y.o. female who is seen today as an office consultation for evaluation of Breast Cancer  We are asked to see the patient in consultation by Dr. Lanny to evaluate her for a new left breast cancer. The patient is a 89 year old white female who recently went for a routine screening mammogram. At that time she was found to have a 1.7 cm mass in the upper outer quadrant of the left breast. The lymph nodes looked normal. The mass was biopsied and came back as a grade 1 invasive ductal cancer that was ER and PR positive and HER2 negative with a Ki-67 of 5%. She does have a history of right breast cancer treated 40 years ago with mastectomy she is otherwise in good health and does not smoke.  Review of Systems: A complete review of systems was obtained from the patient. I have reviewed this information and discussed as appropriate with the patient. See HPI as well for other ROS.  ROS   Medical History: Past Medical History:  Diagnosis Date  History of cancer  Hyperlipidemia  Hypertension   Patient Active Problem List  Diagnosis  Malignant neoplasm of upper-outer quadrant of left breast in female, estrogen receptor positive (CMS/HHS-HCC)   Past Surgical History:  Procedure Laterality Date  breast surgery  CHOLECYSTECTOMY  HYSTERECTOMY    Allergies  Allergen Reactions  Bacitracin-Polymyxin B Other (See Comments)  REACTION: rash  Shellfish Derived Nausea and Vomiting  Neomycin-Polymyxin-Gramicidin Rash  Also gets blisters   Current Outpatient Medications on File Prior to Visit  Medication Sig Dispense Refill  acetaminophen  (TYLENOL ) 650 MG ER tablet Take by mouth  amLODIPine  (NORVASC ) 5 MG tablet Take 7.5 mg by mouth once daily  atorvastatin  (LIPITOR) 20 MG tablet Take 10 mg by mouth once  daily  hydroCHLOROthiazide  (MICROZIDE ) 12.5 mg capsule Take 12.5 mg by mouth once daily  labetaloL  (TRANDATE ) 100 MG tablet Take 100 mg by mouth 2 (two) times daily  lisinopriL  (ZESTRIL ) 40 MG tablet Take 40 mg by mouth once daily   No current facility-administered medications on file prior to visit.   Family History  Problem Relation Age of Onset  Lung cancer Father    Social History   Tobacco Use  Smoking Status Never  Smokeless Tobacco Never    Social History   Socioeconomic History  Marital status: Married  Tobacco Use  Smoking status: Never  Smokeless tobacco: Never   Social Drivers of Corporate investment banker Strain: Low Risk (08/29/2023)  Received from [facility]  Overall Financial Resource Strain (CARDIA)  How hard is it for you to pay for the very basics like food, housing, medical care, and heating?: Not hard at all  Food Insecurity: No Food Insecurity (08/29/2023)  Received from [facility]  Hunger Vital Sign  Within the past 12 months, you worried that your food would run out before you got the money to buy more.: Never true  Within the past 12 months, the food you bought just didn't last and you didn't have money to get more.: Never true  Transportation Needs: No Transportation Needs (08/29/2023)  Received from [facility] - Transportation  In the past 12 months, has lack of transportation kept you from medical appointments or from getting medications?: No  In the past 12 months, has lack of transportation  kept you from meetings, work, or from getting things needed for daily living?: No  Physical Activity: Inactive (08/29/2023)  Received from [facility]  Exercise Vital Sign  On average, how many days per week do you engage in moderate to strenuous exercise (like a brisk walk)?: 0 days  Stress: No Stress Concern Present (08/29/2023)  Received from [facility] - Occupational Stress Questionnaire  Do you feel  stress - tense, restless, nervous, or anxious, or unable to sleep at night because your mind is troubled all the time - these days?: Not at all  Social Connections: Moderately Isolated (08/29/2023)  Received from [facility]  Social Connection and Isolation Panel  In a typical week, how many times do you talk on the phone with family, friends, or neighbors?: Once a week  How often do you get together with friends or relatives?: Once a week  How often do you attend church or religious services?: More than 4 times per year  Do you belong to any clubs or organizations such as church groups, unions, fraternal or athletic groups, or school groups?: Yes  How often do you attend meetings of the clubs or organizations you belong to?: 1 to 4 times per year  Are you married, widowed, divorced, separated, never married, or living with a partner?: Widowed   Objective:  There were no vitals filed for this visit.  There is no height or weight on file to calculate BMI.  Physical Exam Vitals reviewed.  Constitutional:  General: She is not in acute distress. Appearance: Normal appearance.  HENT:  Head: Normocephalic and atraumatic.  Right Ear: External ear normal.  Left Ear: External ear normal.  Nose: Nose normal.  Mouth/Throat:  Mouth: Mucous membranes are moist.  Pharynx: Oropharynx is clear.  Eyes:  General: No scleral icterus. Extraocular Movements: Extraocular movements intact.  Conjunctiva/sclera: Conjunctivae normal.  Pupils: Pupils are equal, round, and reactive to light.  Cardiovascular:  Rate and Rhythm: Normal rate and regular rhythm.  Pulses: Normal pulses.  Heart sounds: Normal heart sounds.  Pulmonary:  Effort: Pulmonary effort is normal. No respiratory distress.  Breath sounds: Normal breath sounds.  Abdominal:  General: Bowel sounds are normal.  Palpations: Abdomen is soft.  Tenderness: There is no abdominal tenderness.  Musculoskeletal:  General: No swelling, tenderness or  deformity. Normal range of motion.  Cervical back: Normal range of motion and neck supple.  Skin: General: Skin is warm and dry.  Coloration: Skin is not jaundiced.  Neurological:  General: No focal deficit present.  Mental Status: She is alert and oriented to person, place, and time.  Psychiatric:  Mood and Affect: Mood normal.  Behavior: Behavior normal.     Breast: There is no palpable mass in either breast. There is no palpable axillary, supraclavicular, or cervical lymphadenopathy.  Labs, Imaging and Diagnostic Testing:  Assessment and Plan:   Diagnoses and all orders for this visit:  Malignant neoplasm of upper-outer quadrant of left breast in female, estrogen receptor positive (CMS/HHS-HCC) - [facility] Case Posting Request; Future   The patient appears to have a 1.7 cm cancer in the upper outer quadrant of the left breast with all favorable markers and clinically normal nodes. I have discussed with her in detail the different options for treatment and at this point she favors breast conservation which I feel is very reasonable. She will not need a node evaluation. I have discussed with her in detail the risks and benefits of the  operation as well as some of the technical aspects including the use of a radioactive seed for localization and she understands and wishes to proceed. We will move forward with surgical scheduling.

## 2023-10-01 NOTE — Transfer of Care (Signed)
 Immediate Anesthesia Transfer of Care Note  Patient: Jane Cox  Procedure(s) Performed: BREAST LUMPECTOMY WITH MAG SEED LOCALIZATION (Left: Breast)  Patient Location: PACU  Anesthesia Type:General  Level of Consciousness: oriented and patient cooperative  Airway & Oxygen Therapy: Patient Spontanous Breathing and Patient connected to nasal cannula oxygen  Post-op Assessment: Report given to RN, Post -op Vital signs reviewed and stable, Patient moving all extremities X 4, and Patient able to stick tongue midline  Post vital signs: Reviewed and stable  Last Vitals:  Vitals Value Taken Time  BP 163/69   Temp 97.6   Pulse 62 10/01/23 11:36  Resp 14 10/01/23 11:36  SpO2 96 % 10/01/23 11:36  Vitals shown include unfiled device data.  Last Pain:  Vitals:   10/01/23 0924  TempSrc:   PainSc: 0-No pain      Patients Stated Pain Goal: 0 (10/01/23 0901)  Complications: No notable events documented.

## 2023-10-02 ENCOUNTER — Encounter (HOSPITAL_COMMUNITY): Payer: Self-pay | Admitting: General Surgery

## 2023-10-02 NOTE — Anesthesia Postprocedure Evaluation (Signed)
 Anesthesia Post Note  Patient: Jane Cox  Procedure(s) Performed: BREAST LUMPECTOMY WITH MAG SEED LOCALIZATION (Left: Breast)     Patient location during evaluation: PACU Anesthesia Type: General Level of consciousness: sedated and patient cooperative Pain management: pain level controlled Vital Signs Assessment: post-procedure vital signs reviewed and stable Respiratory status: spontaneous breathing Cardiovascular status: stable Anesthetic complications: no   No notable events documented.  Last Vitals:  Vitals:   10/01/23 1145 10/01/23 1200  BP: (!) 158/64 (!) 164/66  Pulse: 61 62  Resp: 14 14  Temp:    SpO2: 95% 91%    Last Pain:  Vitals:   10/01/23 1200  TempSrc:   PainSc: 0-No pain                 [name] Pope

## 2023-10-03 LAB — SURGICAL PATHOLOGY

## 2023-10-05 ENCOUNTER — Ambulatory Visit: Payer: Self-pay | Admitting: General Surgery

## 2023-10-06 ENCOUNTER — Encounter: Payer: Self-pay | Admitting: *Deleted

## 2023-10-09 ENCOUNTER — Ambulatory Visit: Admitting: Hematology

## 2023-10-23 ENCOUNTER — Inpatient Hospital Stay
Admission: RE | Admit: 2023-10-23 | Discharge: 2023-10-23 | Disposition: A | Discharge status: Home / Self Care | Payer: Self-pay | Source: Ambulatory Visit | Attending: Radiation Oncology | Admitting: Radiation Oncology

## 2023-10-23 ENCOUNTER — Other Ambulatory Visit: Payer: Self-pay | Admitting: Radiation Oncology

## 2023-10-23 DIAGNOSIS — Z170 Estrogen receptor positive status [ER+]: Secondary | ICD-10-CM

## 2023-10-28 DIAGNOSIS — Z23 Encounter for immunization: Secondary | ICD-10-CM | POA: Diagnosis not present

## 2023-10-29 NOTE — Progress Notes (Signed)
 Location of Breast Cancer:   Histology per Pathology Report: ;   Receptor Status: ER(100), PR (90), Her2-neu (negative), Ki-67(5)  Did patient present with symptoms (if so, please note symptoms) or was this found on screening mammography?: screening mammogram   Past/Anticipated interventions by surgeon, if jwb:ozqu    Past/Anticipated interventions by medical oncology, if any:  Dr. Lanny   Lymphedema issues, if any:  no    Pain issues, if any:  no    SAFETY ISSUES: Prior radiation? no Pacemaker/ICD? no Possible current pregnancy?no Is the patient on methotrexate? no  Current Complaints / other details:    BP (!) 171/54 (BP Location: Left Arm, Patient Position: Sitting, Cuff Size: Normal)   Pulse 61   Temp (!) 97.4 F (36.3 C)   Resp 20   Ht 5' 1 (1.549 m)   Wt 138 lb 12.8 oz (63 kg)   SpO2 96%   BMI 26.23 kg/m

## 2023-10-31 ENCOUNTER — Telehealth (INDEPENDENT_AMBULATORY_CARE_PROVIDER_SITE_OTHER): Payer: Self-pay | Admitting: Otolaryngology

## 2023-10-31 NOTE — Telephone Encounter (Signed)
 LVM for patient to call back to reschedule 11/26 appointment - office closed - per Dr Karis will see on Friday 11/28.

## 2023-11-01 NOTE — Progress Notes (Signed)
 Radiation Oncology         [phone] ________________________________  Name: Jane Cox MRN: [id number]  Date: 11/03/2023  DOB: 06-Apr-1933  Re-Evaluation Note  CC: Katrinka Garnette KIDD, MD  Lanny Callander, MD  No diagnosis found.  Diagnosis:  Stage IA (cT1c, cN0, cM0) Left Breast UOQ, Invasive ductal carcinoma with intermediate grade DCIS, ER+ / PR+ / Her2-, Grade 1: s/p left breast lumpectomy without nodal biopsies    Cancer Staging  Malignant neoplasm of upper-outer quadrant of left breast in female, estrogen receptor positive (HCC) Staging form: Breast, AJCC 8th Edition - Clinical: Stage IA (cT1c, cN0, cM0, G1, ER+, PR+, HER2-) - Signed by Lanny Callander, MD on 09/09/2023  Narrative:  The patient returns today to discuss radiation treatment options. She was seen in the multidisciplinary breast clinic on 09/10/23.   She underwent genetic testing on her consultation date. Results showed no clinically significant variants detected by BRCA1/2 analyses with +RNAinsight analyses.  Since her consultation date, she opted to proceed with a left breast lumpectomy without nodal biopsies on 10/01/23 under the care of Dr. Curvin. Pathology from the procedure revealed: tumor the size of 2.1 cm; histology of grade 1 invasive ductal carcinoma with intermediate grade DCIS; all margins negative for invasive and in situ carcinoma; margin status to invasive disease of 4 mm from the anterior margin; margin status to in situ disease of 3 mm from the superior and anterior margins. Prognostic indicators significant for: estrogen receptor 100% positive and progesterone receptor 90% positive, both with strong staining intensity; Proliferation marker Ki67 at 5%; Her2 status negative; Grade 1.    She established care with Dr. Lanny on her breast clinic consultation date. She will follow-up with Dr. Lanny in the near future to further discuss antiestrogen treatment options.   On review of systems, the patient reports ***.  She denies *** and any other symptoms.    Allergies:  is allergic to celebrex  [celecoxib ], naprosyn  [naproxen ], shellfish allergy, and neosporin [bacitracin-polymyxin b].  Meds: Current Outpatient Medications  Medication Sig Dispense Refill   acetaminophen  (TYLENOL ) 650 MG CR tablet Take 650 mg by mouth every 8 (eight) hours as needed for pain or fever.     amLODipine  (NORVASC ) 5 MG tablet Take 1.5 tablets (7.5 mg total) by mouth daily. 135 tablet 3   atorvastatin  (LIPITOR) 20 MG tablet TAKE 1/2 TABLET BY MOUTH DAILY (Patient taking differently: Take 10 mg by mouth at bedtime.) 45 tablet 1   hydrochlorothiazide  (MICROZIDE ) 12.5 MG capsule TAKE 1 CAPSULE BY MOUTH EVERY DAY 90 capsule 1   labetalol  (NORMODYNE ) 100 MG tablet TAKE 1 TABLET BY MOUTH TWICE A DAY 180 tablet 3   lisinopril  (ZESTRIL ) 40 MG tablet TAKE 1 TABLET BY MOUTH EVERY DAY 90 tablet 1   Multiple Vitamins-Minerals (MULTIVITAMIN WOMEN 50+) TABS Take 1 tablet by mouth daily.     oxyCODONE  (ROXICODONE ) 5 MG immediate release tablet Take 1 tablet (5 mg total) by mouth every 6 (six) hours as needed. 10 tablet 0   No current facility-administered medications for this encounter.    Physical Findings: The patient is in no acute distress. Patient is alert and oriented.  vitals were not taken for this visit.  No significant changes. Lungs are clear to auscultation bilaterally. Heart has regular rate and rhythm. No palpable cervical, supraclavicular, or axillary adenopathy. Abdomen soft, non-tender, normal bowel sounds. Right Breast: no palpable mass, nipple discharge or bleeding. Left Breast: ***  Lab Findings: Lab Results  Component Value  Date   WBC 8.6 09/10/2023   HGB 11.0 (L) 09/10/2023   HCT 33.3 (L) 09/10/2023   MCV 83.7 09/10/2023   PLT 320 09/10/2023    Radiographic Findings: No results found.  Impression: Stage IA (cT1c, cN0, cM0) Left Breast UOQ, Invasive ductal carcinoma with intermediate grade DCIS, ER+ / PR+ /  Her2-, Grade 1: s/p left breast lumpectomy without nodal biopsies   ***  Plan:  Patient is scheduled for CT simulation {date/later today}. ***  -----------------------------------  Lynwood CHARM Nasuti, PhD, MD  This document serves as a record of services personally performed by Lynwood Nasuti, MD. It was created on his behalf by Dorthy Fuse, a trained medical scribe. The creation of this record is based on the scribe's personal observations and the provider's statements to them. This document has been checked and approved by the attending provider.

## 2023-11-03 ENCOUNTER — Ambulatory Visit
Admission: RE | Admit: 2023-11-03 | Discharge: 2023-11-03 | Disposition: A | Discharge status: Home / Self Care | Source: Ambulatory Visit | Attending: Radiation Oncology | Admitting: Radiation Oncology

## 2023-11-03 ENCOUNTER — Encounter: Payer: Self-pay | Admitting: Radiation Oncology

## 2023-11-03 VITALS — BP 171/54 | HR 61 | Temp 97.4°F | Resp 20 | Ht 61.0 in | Wt 138.8 lb

## 2023-11-03 DIAGNOSIS — Z170 Estrogen receptor positive status [ER+]: Secondary | ICD-10-CM | POA: Diagnosis not present

## 2023-11-03 DIAGNOSIS — Z1732 Human epidermal growth factor receptor 2 negative status: Secondary | ICD-10-CM | POA: Insufficient documentation

## 2023-11-03 DIAGNOSIS — C50412 Malignant neoplasm of upper-outer quadrant of left female breast: Secondary | ICD-10-CM | POA: Insufficient documentation

## 2023-11-03 DIAGNOSIS — Z79899 Other long term (current) drug therapy: Secondary | ICD-10-CM | POA: Diagnosis not present

## 2023-11-03 DIAGNOSIS — Z1721 Progesterone receptor positive status: Secondary | ICD-10-CM | POA: Insufficient documentation

## 2023-11-03 DIAGNOSIS — Z510 Encounter for antineoplastic radiation therapy: Secondary | ICD-10-CM | POA: Diagnosis not present

## 2023-11-03 DIAGNOSIS — Z923 Personal history of irradiation: Secondary | ICD-10-CM | POA: Diagnosis not present

## 2023-11-10 ENCOUNTER — Encounter: Payer: Self-pay | Admitting: *Deleted

## 2023-11-10 DIAGNOSIS — Z170 Estrogen receptor positive status [ER+]: Secondary | ICD-10-CM

## 2023-11-10 DIAGNOSIS — C50412 Malignant neoplasm of upper-outer quadrant of left female breast: Secondary | ICD-10-CM | POA: Diagnosis not present

## 2023-11-10 DIAGNOSIS — Z510 Encounter for antineoplastic radiation therapy: Secondary | ICD-10-CM | POA: Diagnosis not present

## 2023-11-11 ENCOUNTER — Telehealth: Payer: Self-pay | Admitting: Hematology

## 2023-11-11 ENCOUNTER — Ambulatory Visit
Admission: RE | Admit: 2023-11-11 | Discharge: 2023-11-11 | Disposition: A | Discharge status: Home / Self Care | Source: Ambulatory Visit | Attending: Radiation Oncology | Admitting: Radiation Oncology

## 2023-11-11 ENCOUNTER — Ambulatory Visit
Admission: RE | Admit: 2023-11-11 | Discharge: 2023-11-11 | Disposition: A | Source: Ambulatory Visit | Attending: Radiation Oncology | Admitting: Radiation Oncology

## 2023-11-11 ENCOUNTER — Other Ambulatory Visit: Payer: Self-pay

## 2023-11-11 DIAGNOSIS — Z510 Encounter for antineoplastic radiation therapy: Secondary | ICD-10-CM | POA: Diagnosis not present

## 2023-11-11 DIAGNOSIS — C50412 Malignant neoplasm of upper-outer quadrant of left female breast: Secondary | ICD-10-CM | POA: Diagnosis not present

## 2023-11-11 LAB — RAD ONC ARIA SESSION SUMMARY
Course Elapsed Days: 0
Plan Fractions Treated to Date: 1
Plan Prescribed Dose Per Fraction: 5.7 Gy
Plan Total Fractions Prescribed: 5
Plan Total Prescribed Dose: 28.5 Gy
Reference Point Dosage Given to Date: 5.7 Gy
Reference Point Session Dosage Given: 5.7 Gy
Session Number: 1

## 2023-11-11 NOTE — Telephone Encounter (Signed)
 Jane Cox has been scheduled and contacted for her post treatment appointment. I attempted to get her in for 11/11 as that is the same day she has her final radiation appts. [name] declined ans stated that she prefers to be seen on 11/13.

## 2023-11-18 ENCOUNTER — Other Ambulatory Visit: Payer: Self-pay

## 2023-11-18 ENCOUNTER — Ambulatory Visit
Admission: RE | Admit: 2023-11-18 | Discharge: 2023-11-18 | Disposition: A | Discharge status: Home / Self Care | Source: Ambulatory Visit | Attending: Radiation Oncology | Admitting: Radiation Oncology

## 2023-11-18 DIAGNOSIS — C50412 Malignant neoplasm of upper-outer quadrant of left female breast: Secondary | ICD-10-CM | POA: Diagnosis not present

## 2023-11-18 DIAGNOSIS — Z510 Encounter for antineoplastic radiation therapy: Secondary | ICD-10-CM | POA: Diagnosis not present

## 2023-11-18 LAB — RAD ONC ARIA SESSION SUMMARY
Course Elapsed Days: 7
Plan Fractions Treated to Date: 2
Plan Prescribed Dose Per Fraction: 5.7 Gy
Plan Total Fractions Prescribed: 5
Plan Total Prescribed Dose: 28.5 Gy
Reference Point Dosage Given to Date: 11.4 Gy
Reference Point Session Dosage Given: 5.7 Gy
Session Number: 2

## 2023-11-25 ENCOUNTER — Other Ambulatory Visit: Payer: Self-pay

## 2023-11-25 ENCOUNTER — Ambulatory Visit
Admission: RE | Admit: 2023-11-25 | Discharge: 2023-11-25 | Disposition: A | Source: Ambulatory Visit | Attending: Radiation Oncology | Admitting: Radiation Oncology

## 2023-11-25 DIAGNOSIS — C50412 Malignant neoplasm of upper-outer quadrant of left female breast: Secondary | ICD-10-CM | POA: Diagnosis not present

## 2023-11-25 DIAGNOSIS — Z510 Encounter for antineoplastic radiation therapy: Secondary | ICD-10-CM | POA: Diagnosis not present

## 2023-11-25 LAB — RAD ONC ARIA SESSION SUMMARY
Course Elapsed Days: 14
Plan Fractions Treated to Date: 3
Plan Prescribed Dose Per Fraction: 5.7 Gy
Plan Total Fractions Prescribed: 5
Plan Total Prescribed Dose: 28.5 Gy
Reference Point Dosage Given to Date: 17.1 Gy
Reference Point Session Dosage Given: 5.7 Gy
Session Number: 3

## 2023-12-02 ENCOUNTER — Other Ambulatory Visit: Payer: Self-pay

## 2023-12-02 ENCOUNTER — Ambulatory Visit
Admission: RE | Admit: 2023-12-02 | Discharge: 2023-12-02 | Disposition: A | Discharge status: Home / Self Care | Source: Ambulatory Visit | Attending: Radiation Oncology | Admitting: Radiation Oncology

## 2023-12-02 DIAGNOSIS — Z510 Encounter for antineoplastic radiation therapy: Secondary | ICD-10-CM | POA: Diagnosis not present

## 2023-12-02 DIAGNOSIS — Z170 Estrogen receptor positive status [ER+]: Secondary | ICD-10-CM | POA: Insufficient documentation

## 2023-12-02 DIAGNOSIS — C50412 Malignant neoplasm of upper-outer quadrant of left female breast: Secondary | ICD-10-CM | POA: Diagnosis present

## 2023-12-02 LAB — RAD ONC ARIA SESSION SUMMARY
Course Elapsed Days: 21
Plan Fractions Treated to Date: 4
Plan Prescribed Dose Per Fraction: 5.7 Gy
Plan Total Fractions Prescribed: 5
Plan Total Prescribed Dose: 28.5 Gy
Reference Point Dosage Given to Date: 22.8 Gy
Reference Point Session Dosage Given: 5.7 Gy
Session Number: 4

## 2023-12-09 ENCOUNTER — Ambulatory Visit

## 2023-12-11 ENCOUNTER — Other Ambulatory Visit: Payer: Self-pay

## 2023-12-11 ENCOUNTER — Ambulatory Visit
Admission: RE | Admit: 2023-12-11 | Discharge: 2023-12-11 | Disposition: A | Discharge status: Home / Self Care | Source: Ambulatory Visit | Attending: Radiation Oncology | Admitting: Radiation Oncology

## 2023-12-11 ENCOUNTER — Ambulatory Visit
Admission: RE | Admit: 2023-12-11 | Discharge: 2023-12-11 | Attending: Radiation Oncology | Admitting: Radiation Oncology

## 2023-12-11 ENCOUNTER — Inpatient Hospital Stay: Attending: Hematology | Admitting: Hematology

## 2023-12-11 VITALS — BP 170/70 | HR 70 | Temp 98.0°F | Resp 19 | Ht 61.0 in | Wt 143.2 lb

## 2023-12-11 DIAGNOSIS — E538 Deficiency of other specified B group vitamins: Secondary | ICD-10-CM | POA: Insufficient documentation

## 2023-12-11 DIAGNOSIS — C50412 Malignant neoplasm of upper-outer quadrant of left female breast: Secondary | ICD-10-CM | POA: Diagnosis present

## 2023-12-11 DIAGNOSIS — D649 Anemia, unspecified: Secondary | ICD-10-CM | POA: Diagnosis not present

## 2023-12-11 DIAGNOSIS — Z170 Estrogen receptor positive status [ER+]: Secondary | ICD-10-CM | POA: Diagnosis not present

## 2023-12-11 DIAGNOSIS — Z510 Encounter for antineoplastic radiation therapy: Secondary | ICD-10-CM | POA: Diagnosis present

## 2023-12-11 LAB — RAD ONC ARIA SESSION SUMMARY
Course Elapsed Days: 30
Plan Fractions Treated to Date: 5
Plan Prescribed Dose Per Fraction: 5.7 Gy
Plan Total Fractions Prescribed: 5
Plan Total Prescribed Dose: 28.5 Gy
Reference Point Dosage Given to Date: 28.5 Gy
Reference Point Session Dosage Given: 5.7 Gy
Session Number: 5

## 2023-12-11 MED ORDER — RADIAPLEXRX EX GEL: Freq: Once | CUTANEOUS | Status: AC

## 2023-12-11 NOTE — Assessment & Plan Note (Addendum)
 pT2N0M0, stage I, ER+/PR+/HER2- -diagnosed in August 2025, discovered on screening mammogram. -Status postlumpectomy, which showed a grade 1 2.1 cm ductal carcinoma, margins were negative. -Status post adjuvant radiation - Patient declined adjuvant antiestrogen therapy. -Continue breast cancer surveillance.

## 2023-12-12 NOTE — Radiation Completion Notes (Addendum)
  Radiation Oncology         [phone] ________________________________  Name: Jane Cox MRN: [id number]  Date of Service: 12/11/2023  DOB: September 07, 1933  End of Treatment Note  Diagnosis: Stage IA (cT1c, cN0, cM0) Left Breast UOQ, Invasive ductal carcinoma with intermediate grade DCIS, ER+ / PR+ / Her2-  Intent: Curative     ==========DELIVERED PLANS==========  First Treatment Date: 2023-11-11 Last Treatment Date: 2023-12-11   Plan Name: Breast_L_BH Site: Breast, Left Technique: 3D Mode: Photon Dose Per Fraction: 5.7 Gy Prescribed Dose (Delivered / Prescribed): 28.5 Gy / 28.5 Gy Prescribed Fxs (Delivered / Prescribed): 5 / 5     ==========ON TREATMENT VISIT DATES========== 2023-11-11, 2023-12-11     ====================================   The patient tolerated radiation. She developed some tenderness to the breast throughout her treatment.   The patient will return in one month and will continue follow up with Dr. Lanny as well.      Ronita Due, PA-C

## 2023-12-13 NOTE — Progress Notes (Signed)
 [facility]   [phone] Fax:([phone]   Clinic Follow up Note   Patient Care Team: Katrinka Garnette KIDD, MD as PCP - General (Family Medicine) Tyree Nanetta SAILOR, RN as Oncology Nurse Navigator Gerome, Devere HERO, RN as Oncology Nurse Navigator Curvin Deward MOULD, MD as Consulting Physician (General Surgery) Lanny Callander, MD as Consulting Physician (Hematology) Shannon Agent, MD as Consulting Physician (Radiation Oncology)  Date of Service:  12/11/2023  CHIEF COMPLAINT: f/u of left breast cancer  CURRENT THERAPY:  Cancer surveillance  Oncology History   Malignant neoplasm of upper-outer quadrant of left breast in female, estrogen receptor positive (HCC) pT2N0M0, stage I, ER+/PR+/HER2- -diagnosed in August 2025, discovered on screening mammogram. -Status postlumpectomy, which showed a grade 1 2.1 cm ductal carcinoma, margins were negative. -Status post adjuvant radiation - Patient declined adjuvant antiestrogen therapy. -Continue breast cancer surveillance.  Assessment & Plan Malignant neoplasm of upper-outer quadrant of left breast, status post surgery and radiation Completed surgery and radiation therapy for a 2.1 cm ERPR positive tumor in the upper-outer quadrant of the left breast. Declined anti-estrogen therapy. Small risk of recurrence, particularly bone metastasis, discussed. - Continue annual mammogram follow-up. - Monitor for significant bone pain, especially in the spine, sternum, ribcage, and pelvic area, and report if persistent. - Follow up with primary care physician or oncologist as needed.  Anemia and vitamin B12 deficiency Mild anemia and vitamin B12 deficiency. B12 deficiency can contribute to anemia and neuropathy. Currently taking a multivitamin but not B12 supplements due to gastrointestinal side effects from previous supplements. - Continue multivitamin supplementation. - Consider over-the-counter B12 supplementation to address  deficiency. - Discuss B12 levels and supplementation with primary care physician in February.  Plan - I reviewed adjuvant antiadjuvant therapy, she declined.  Given her advanced age and relatively low risk of recurrence, I think that is reasonable. - She will continue follow-up with Dr. Curvin for breast cancer surveillance.  I will see her as needed   SUMMARY OF ONCOLOGIC HISTORY: Oncology History  Malignant neoplasm of upper-outer quadrant of left breast in female, estrogen receptor positive (HCC)  09/08/2023 Initial Diagnosis   Malignant neoplasm of upper-outer quadrant of left breast in female, estrogen receptor positive (HCC)   09/09/2023 Cancer Staging   Staging form: Breast, AJCC 8th Edition - Clinical: Stage IA (cT1c, cN0, cM0, G1, ER+, PR+, HER2-) - Signed by Lanny Callander, MD on 09/09/2023 Histologic grading system: 3 grade system   09/22/2023 Genetic Testing   Negative genetic testing on the CancerNext-Expanded+RNAinsight panel.  The report date is September 22, 2023.  The CancerNext-Expanded gene panel offered by [facility] and includes sequencing, rearrangement, and RNA analysis for the following 77 genes: AIP, ALK, APC, ATM, BAP1, BARD1, BMPR1A, BRCA1, BRCA2, BRIP1, CDC73, CDH1, CDK4, CDKN1B, CDKN2A, CEBPA, CHEK2, CTNNA1, DDX41, DICER1, ETV6, FH, FLCN, GATA2, LZTR1, MAX, MBD4, MEN1, MET, MLH1, MSH2, MSH3, MSH6, MUTYH, NF1, NF2, NTHL1, PALB2, PHOX2B, PMS2, POT1, PRKAR1A, PTCH1, PTEN, RAD51C, RAD51D, RB1, RET, RPS20, RUNX1, SDHA, SDHAF2, SDHB, SDHC, SDHD, SMAD4, SMARCA4, SMARCB1, SMARCE1, STK11, SUFU, TMEM127, TP53, TSC1, TSC2, VHL, and WT1 (sequencing and deletion/duplication); AXIN2, CTNNA1, DDX41, EGFR, HOXB13, KIT, MBD4, MITF, MSH3, PDGFRA, POLD1 and POLE (sequencing only); EPCAM and GREM1 (deletion/duplication only). RNA data is routinely analyzed for use in variant interpretation for all genes.   10/01/2023 Cancer Staging   Staging form: Breast, AJCC 8th Edition - Pathologic stage  from 10/01/2023: Stage Unknown (pT2, pNX, cM0, G1, ER+, PR+, HER2-) - Signed by Lanny Callander,  MD on 12/11/2023 Stage prefix: Initial diagnosis Histologic grading system: 3 grade system Residual tumor (R): R0      Discussed the use of AI scribe software for clinical note transcription with the patient, who gave verbal consent to proceed.  History of Present Illness Jane Cox is an 89 year old female with breast cancer who presents for follow-up after completing radiation therapy.  She completed radiation therapy without skin complications. Her tumor is ERPR positive. She underwent surgery on September 3rd, which she tolerated well. She has mild anemia and takes a multivitamin. Her B12 levels have been low in the past, but she is not currently taking B12 supplements. She experienced significant stomach upset from naproxen , leading to a temporary cessation of other supplements. She has managed high blood pressure since age 52.     All other systems were reviewed with the patient and are negative.  MEDICAL HISTORY:  Past Medical History:  Diagnosis Date   Allergy    Anemia    Arthritis    Basal cell carcinoma    Family history of breast cancer    Family history of breast cancer    Family history of colon cancer    HX: breast cancer    Hyperlipidemia    Hypertension    Osteopenia    PONV (postoperative nausea and vomiting)     SURGICAL HISTORY: Past Surgical History:  Procedure Laterality Date   ABDOMINAL HYSTERECTOMY  01/28/1981   BREAST LUMPECTOMY WITH RADIOACTIVE SEED LOCALIZATION Left 10/01/2023   Procedure: BREAST LUMPECTOMY WITH MAG SEED LOCALIZATION;  Surgeon: Curvin Deward MOULD, MD;  Location: [facility] OR;  Service: General;  Laterality: Left;  LEFT BREAS LUMPECTOMY   BREAST SURGERY Right 1980   mastectomy   CHOLECYSTECTOMY  01/29/1991   COLONOSCOPY     EYE SURGERY     VEIN SURGERY      I have reviewed the social history and family history with the patient and they are  unchanged from previous note.  ALLERGIES:  is allergic to celebrex  [celecoxib ], naprosyn  [naproxen ], shellfish allergy, and neosporin [bacitracin-polymyxin b].  MEDICATIONS:  Current Outpatient Medications  Medication Sig Dispense Refill   acetaminophen  (TYLENOL ) 650 MG CR tablet Take 650 mg by mouth every 8 (eight) hours as needed for pain or fever.     amLODipine  (NORVASC ) 5 MG tablet Take 1.5 tablets (7.5 mg total) by mouth daily. 135 tablet 3   atorvastatin  (LIPITOR) 20 MG tablet TAKE 1/2 TABLET BY MOUTH DAILY 45 tablet 1   hydrochlorothiazide  (MICROZIDE ) 12.5 MG capsule TAKE 1 CAPSULE BY MOUTH EVERY DAY 90 capsule 1   labetalol  (NORMODYNE ) 100 MG tablet TAKE 1 TABLET BY MOUTH TWICE A DAY 180 tablet 3   lisinopril  (ZESTRIL ) 40 MG tablet TAKE 1 TABLET BY MOUTH EVERY DAY 90 tablet 1   Multiple Vitamins-Minerals (MULTIVITAMIN WOMEN 50+) TABS Take 1 tablet by mouth daily.     No current facility-administered medications for this visit.    PHYSICAL EXAMINATION: ECOG PERFORMANCE STATUS: 1 - Symptomatic but completely ambulatory  Vitals:   12/11/23 1300 12/11/23 1404  BP: (!) 164/78 (!) 170/70  Pulse: 70   Resp: 19   Temp: 98 F (36.7 C)   SpO2: 94%    Wt Readings from Last 3 Encounters:  12/11/23 143 lb 3.2 oz (65 kg)  11/03/23 138 lb 12.8 oz (63 kg)  10/01/23 142 lb 3.2 oz (64.5 kg)     GENERAL:alert, no distress and comfortable SKIN: skin color, texture, turgor  are normal, no rashes or significant lesions EYES: normal, Conjunctiva are pink and non-injected, sclera clear Musculoskeletal:no cyanosis of digits and no clubbing  NEURO: alert & oriented x 3 with fluent speech, no focal motor/sensory deficits  Physical Exam    LABORATORY DATA:  I have reviewed the data as listed    Latest Ref Rng & Units 09/10/2023   12:24 PM 05/28/2023    7:18 PM 02/25/2023    3:52 PM  CBC  WBC 4.0 - 10.5 K/uL 8.6  15.5  7.5   Hemoglobin 12.0 - 15.0 g/dL 88.9  88.4  88.4   Hematocrit  36.0 - 46.0 % 33.3  35.5  34.3   Platelets 150 - 400 K/uL 320  305  258.0         Latest Ref Rng & Units 09/10/2023   12:24 PM 05/28/2023    7:18 PM 02/25/2023    3:52 PM  CMP  Glucose 70 - 99 mg/dL 96  853  894   BUN 8 - 23 mg/dL 13  19  16    Creatinine 0.44 - 1.00 mg/dL 9.18  9.18  9.10   Sodium 135 - 145 mmol/L 137  135  138   Potassium 3.5 - 5.1 mmol/L 4.2  3.6  4.1   Chloride 98 - 111 mmol/L 103  102  103   CO2 22 - 32 mmol/L 28  23  26    Calcium  8.9 - 10.3 mg/dL 89.9  9.7  89.9   Total Protein 6.5 - 8.1 g/dL 6.7   6.8   Total Bilirubin 0.0 - 1.2 mg/dL 0.7   0.8   Alkaline Phos 38 - 126 U/L 132   108   AST 15 - 41 U/L 13   16   ALT 0 - 44 U/L 13   15       RADIOGRAPHIC STUDIES: I have personally reviewed the radiological images as listed and agreed with the findings in the report. No results found.    No orders of the defined types were placed in this encounter.  All questions were answered. The patient knows to call the clinic with any problems, questions or concerns. No barriers to learning was detected. The total time spent in the appointment was 15 minutes, including review of chart and various tests results, discussions about plan of care and coordination of care plan     Onita Mattock, MD 12/11/2023

## 2023-12-24 ENCOUNTER — Ambulatory Visit (INDEPENDENT_AMBULATORY_CARE_PROVIDER_SITE_OTHER): Admitting: Otolaryngology

## 2024-01-08 ENCOUNTER — Ambulatory Visit: Admitting: Radiation Oncology

## 2024-01-09 ENCOUNTER — Encounter: Payer: Self-pay | Admitting: Radiation Oncology

## 2024-01-11 NOTE — Progress Notes (Incomplete)
 Radiation Oncology         [phone] ________________________________  Name: Jane Cox MRN: [id number]  Date: 01/12/2024  DOB: February 13, 1933  Follow-Up Visit Note  CC: Katrinka Garnette KIDD, MD  Katrinka Garnette KIDD, MD  No diagnosis found.  Diagnosis: Stage IA (cT1c, cN0, cM0) Left Breast UOQ, Invasive ductal carcinoma with intermediate grade DCIS, ER+ / PR+ / Her2-   Interval Since Last Radiation: 1 month and 2 days   Intent: Curative  Radiation Treatment Dates: First Treatment Date: 2023-11-11 -- Last Treatment Date: 2023-12-11 Site/Dose/Technique/Mode:  Plan Name: Breast_L_BH Site: Breast, Left Technique: 3D Mode: Photon Dose Per Fraction: 5.7 Gy Prescribed Dose (Delivered / Prescribed): 28.5 Gy / 28.5 Gy Prescribed Fxs (Delivered / Prescribed): 5 / 5  Narrative:  The patient returns today for routine follow-up. She tolerated radiation therapy relatively well overall other than some tenderness to the breast throughout her treatment course.  On the date of her final treatment, she followed up with Dr. Lanny to discuss antiestrogen treatment options. The patient has opted to forgo antiestrogen therapy and will proceed with breast surveillance.          No other significant interval history since the patient completed radiation therapy.   ***                        Allergies:  is allergic to celebrex  [celecoxib ], naprosyn  [naproxen ], shellfish allergy, and neosporin [bacitracin-polymyxin b].  Meds: Current Outpatient Medications  Medication Sig Dispense Refill   acetaminophen  (TYLENOL ) 650 MG CR tablet Take 650 mg by mouth every 8 (eight) hours as needed for pain or fever.     amLODipine  (NORVASC ) 5 MG tablet Take 1.5 tablets (7.5 mg total) by mouth daily. 135 tablet 3   atorvastatin  (LIPITOR) 20 MG tablet TAKE 1/2 TABLET BY MOUTH DAILY 45 tablet 1   hydrochlorothiazide  (MICROZIDE ) 12.5 MG capsule TAKE 1 CAPSULE BY MOUTH EVERY DAY 90 capsule 1   labetalol  (NORMODYNE ) 100  MG tablet TAKE 1 TABLET BY MOUTH TWICE A DAY 180 tablet 3   lisinopril  (ZESTRIL ) 40 MG tablet TAKE 1 TABLET BY MOUTH EVERY DAY 90 tablet 1   Multiple Vitamins-Minerals (MULTIVITAMIN WOMEN 50+) TABS Take 1 tablet by mouth daily.     No current facility-administered medications for this encounter.    Physical Findings: The patient is in no acute distress. Patient is alert and oriented.  vitals were not taken for this visit. .  No significant changes. Lungs are clear to auscultation bilaterally. Heart has regular rate and rhythm. No palpable cervical, supraclavicular, or axillary adenopathy. Abdomen soft, non-tender, normal bowel sounds.  Right Breast: no palpable mass, nipple discharge or bleeding. Left Breast: ***  Lab Findings: Lab Results  Component Value Date   WBC 8.6 09/10/2023   HGB 11.0 (L) 09/10/2023   HCT 33.3 (L) 09/10/2023   MCV 83.7 09/10/2023   PLT 320 09/10/2023    Radiographic Findings: No results found.  Impression: Stage IA (cT1c, cN0, cM0) Left Breast UOQ, Invasive ductal carcinoma with intermediate grade DCIS, ER+ / PR+ / Her2-   The patient is recovering from the effects of radiation.  ***  Plan:  ***   *** minutes of total time was spent for this patient encounter, including preparation, face-to-face counseling with the patient and coordination of care, physical exam, and documentation of the encounter. ____________________________________  Lynwood CHARM Nasuti, PhD, MD  This document serves as a record of services personally performed  by Lynwood Nasuti, MD. It was created on his behalf by Dorthy Fuse, a trained medical scribe. The creation of this record is based on the scribe's personal observations and the provider's statements to them. This document has been checked and approved by the attending provider.

## 2024-01-12 ENCOUNTER — Ambulatory Visit
Admission: RE | Admit: 2024-01-12 | Discharge: 2024-01-12 | Disposition: A | Source: Ambulatory Visit | Attending: Radiation Oncology | Admitting: Radiation Oncology

## 2024-01-12 ENCOUNTER — Telehealth: Payer: Self-pay | Admitting: *Deleted

## 2024-01-12 NOTE — Telephone Encounter (Signed)
 RETURNED PATIENT'S PHONE CALL, SPOKE WITH PATIENT. ?

## 2024-02-08 NOTE — Progress Notes (Signed)
 "  Radiation Oncology         [phone] ________________________________  Name: Jane Cox MRN: [id number]  Date: 02/09/2024  DOB: [birth year]  Follow-Up Visit Note  CC: Jane Garnette KIDD, MD  Jane Garnette KIDD, MD  No diagnosis found. ***  Diagnosis: Stage IA (cT1c, cN0, cM0) Left Breast UOQ, Invasive ductal carcinoma with intermediate grade DCIS, ER+ / PR+ / Her2-   Interval Since Last Radiation: approximately 2 months   Intent: Curative  Radiation Treatment Dates: First Treatment Date: 2023-11-11 -- Last Treatment Date: 2023-12-11 Site/Dose/Technique/Mode:  Plan Name: Breast_L_BH Site: Breast, Left Technique: 3D Mode: Photon Dose Per Fraction: 5.7 Gy Prescribed Dose (Delivered / Prescribed): 28.5 Gy / 28.5 Gy Prescribed Fxs (Delivered / Prescribed): 5 / 5  Narrative:  The patient returns today for routine follow-up. She tolerated radiation therapy relatively well overall other than some tenderness to the breast throughout her treatment course.  On the date of her final treatment, she followed up with Dr. Lanny to discuss antiestrogen treatment options. The patient has opted to forgo antiestrogen therapy and will proceed with breast surveillance.          No other significant interval history since the patient completed radiation therapy.   ***                        Allergies:  is allergic to celebrex  [celecoxib ], naprosyn  [naproxen ], shellfish allergy, and neosporin [bacitracin-polymyxin b].  Meds: Current Outpatient Medications  Medication Sig Dispense Refill   acetaminophen  (TYLENOL ) 650 MG CR tablet Take 650 mg by mouth every 8 (eight) hours as needed for pain or fever.     amLODipine  (NORVASC ) 5 MG tablet Take 1.5 tablets (7.5 mg total) by mouth daily. 135 tablet 3   atorvastatin  (LIPITOR) 20 MG tablet TAKE 1/2 TABLET BY MOUTH DAILY 45 tablet 1   hydrochlorothiazide  (MICROZIDE ) 12.5 MG capsule TAKE 1 CAPSULE BY MOUTH EVERY DAY 90 capsule 1   labetalol   (NORMODYNE ) 100 MG tablet TAKE 1 TABLET BY MOUTH TWICE A DAY 180 tablet 3   lisinopril  (ZESTRIL ) 40 MG tablet TAKE 1 TABLET BY MOUTH EVERY DAY 90 tablet 1   Multiple Vitamins-Minerals (MULTIVITAMIN WOMEN 50+) TABS Take 1 tablet by mouth daily.     No current facility-administered medications for this encounter.    Physical Findings: The patient is in no acute distress. Patient is alert and oriented.  vitals were not taken for this visit. .  No significant changes. Lungs are clear to auscultation bilaterally. Heart has regular rate and rhythm. No palpable cervical, supraclavicular, or axillary adenopathy. Abdomen soft, non-tender, normal bowel sounds.  Right Breast: no palpable mass, nipple discharge or bleeding. Left Breast: ***  Lab Findings: Lab Results  Component Value Date   WBC 8.6 09/10/2023   HGB 11.0 (L) 09/10/2023   HCT 33.3 (L) 09/10/2023   MCV 83.7 09/10/2023   PLT 320 09/10/2023    Radiographic Findings: No results found.  Impression: Stage IA (cT1c, cN0, cM0) Left Breast UOQ, Invasive ductal carcinoma with intermediate grade DCIS, ER+ / PR+ / Her2-    [name] has healed well from the effects of her radiation treatment. *** Continue skin care with topical Vitamin E Oil and / or lotion for at least 2 more months for further healing.  She will continue to follow-up with Dr. Curvin for breast cancer surveillance. I encouraged her to continue with yearly mammography as appropriate.  Radiation follow-up PRN. We  appreciate the opportunity to take part in this patient's care. She has been advised to call back with any issues or concerns.   *** minutes of total time was spent for this patient encounter, including preparation, face-to-face counseling with the patient and coordination of care, physical exam, and documentation of the encounter. ____________________________________   Leeroy Due, PA-C   Lynwood CHARM Nasuti, PhD, MD   [facility]  Radiation Oncology Direct  Dial: [phone]  Fax: [phone] [email or web address]    This document serves as a record of services personally performed by Lynwood Nasuti, MD. It was created on his behalf by Dorthy Fuse, a trained medical scribe. The creation of this record is based on the scribe's personal observations and the provider's statements to them. This document has been checked and approved by the attending provider.  "

## 2024-02-09 ENCOUNTER — Ambulatory Visit
Admission: RE | Admit: 2024-02-09 | Discharge: 2024-02-09 | Disposition: A | Discharge status: Home / Self Care | Source: Ambulatory Visit | Attending: Radiation Oncology | Admitting: Radiation Oncology

## 2024-02-09 ENCOUNTER — Encounter: Payer: Self-pay | Admitting: Radiation Oncology

## 2024-02-09 ENCOUNTER — Ambulatory Visit: Admitting: Radiation Oncology

## 2024-02-09 VITALS — BP 164/58 | HR 73 | Temp 97.6°F | Resp 20 | Ht 61.0 in | Wt 139.4 lb

## 2024-02-09 DIAGNOSIS — C50412 Malignant neoplasm of upper-outer quadrant of left female breast: Secondary | ICD-10-CM

## 2024-02-09 NOTE — Progress Notes (Signed)
 Jane Cox is here today for a 1 month follow up post radiation to the left breast. She completed radiation treatment on 12-11-2023.         Does the patient complain of any of the following: Post radiation skin issues: She has healed well from the effects of her radiation treatment. Continue skin care with topical Vitamin E Oil and / or Radiaplex lotion for at least 2 more months for further healing.   Breast Tenderness: Denies Breast Swelling: Denies Lymphadema: Denies Range of Motion limitations: Denies Fatigue post radiation: Yes Appetite good/fair/poor: Excellent  Additional comments if applicable:  BP (!) 164/58 (BP Location: Left Arm, Patient Position: Sitting, Cuff Size: Large)   Pulse 73   Temp 97.6 F (36.4 C)   Resp 20   Ht 5' 1 (1.549 m)   Wt 139 lb 6.4 oz (63.2 kg)   SpO2 97%   BMI 26.34 kg/m

## 2024-03-02 ENCOUNTER — Inpatient Hospital Stay: Admitting: Nurse Practitioner

## 2024-03-04 ENCOUNTER — Ambulatory Visit: Admitting: Family Medicine

## 2024-03-09 ENCOUNTER — Ambulatory Visit: Admitting: Family Medicine

## 2024-03-17 ENCOUNTER — Inpatient Hospital Stay: Attending: Hematology | Admitting: Nurse Practitioner
